# Patient Record
Sex: Male | Born: 1980
Health system: Southern US, Community
[De-identification: ages and names within clinical notes are randomized; demographics above are authoritative.]

## PROBLEM LIST (undated history)

## (undated) DIAGNOSIS — E46 Unspecified protein-calorie malnutrition: Secondary | ICD-10-CM

## (undated) DIAGNOSIS — G4733 Obstructive sleep apnea (adult) (pediatric): Secondary | ICD-10-CM

## (undated) DIAGNOSIS — G809 Cerebral palsy, unspecified: Secondary | ICD-10-CM

## (undated) DIAGNOSIS — K5909 Other constipation: Secondary | ICD-10-CM

## (undated) DIAGNOSIS — T17908A Unspecified foreign body in respiratory tract, part unspecified causing other injury, initial encounter: Secondary | ICD-10-CM

## (undated) DIAGNOSIS — Z9889 Other specified postprocedural states: Secondary | ICD-10-CM

## (undated) DIAGNOSIS — D509 Iron deficiency anemia, unspecified: Secondary | ICD-10-CM

## (undated) DIAGNOSIS — R0902 Hypoxemia: Secondary | ICD-10-CM

## (undated) DIAGNOSIS — R569 Unspecified convulsions: Secondary | ICD-10-CM

## (undated) DIAGNOSIS — G4731 Primary central sleep apnea: Secondary | ICD-10-CM

## (undated) DIAGNOSIS — Z87442 Personal history of urinary calculi: Secondary | ICD-10-CM

## (undated) DIAGNOSIS — K219 Gastro-esophageal reflux disease without esophagitis: Secondary | ICD-10-CM

## (undated) DIAGNOSIS — G919 Hydrocephalus, unspecified: Secondary | ICD-10-CM

## (undated) DIAGNOSIS — R000 Tachycardia, unspecified: Secondary | ICD-10-CM

## (undated) DIAGNOSIS — I499 Cardiac arrhythmia, unspecified: Secondary | ICD-10-CM

## (undated) HISTORY — PX: OTHER SURGICAL HISTORY: SHX169

## (undated) HISTORY — DX: Iron deficiency anemia, unspecified: D50.9

## (undated) HISTORY — DX: Primary central sleep apnea: G47.31

## (undated) HISTORY — PX: WISDOM TOOTH EXTRACTION: SHX21

## (undated) HISTORY — PX: RHIZOTOMY: SHX2355

## (undated) HISTORY — PX: VENTRICULAR ATRIAL SHUNT: SHX2657

## (undated) HISTORY — DX: Other constipation: K59.09

## (undated) HISTORY — PX: BACK SURGERY: SHX140

## (undated) HISTORY — DX: Cerebral palsy, unspecified: G80.9

## (undated) HISTORY — DX: Unspecified protein-calorie malnutrition: E46

## (undated) HISTORY — DX: Hydrocephalus, unspecified: G91.9

## (undated) HISTORY — PX: CYSTOSCOPY: SUR368

## (undated) HISTORY — PX: TOOTH EXTRACTION: SHX859

## (undated) HISTORY — PX: CSF SHUNT: SHX92

## (undated) HISTORY — DX: Obstructive sleep apnea (adult) (pediatric): G47.33

## (undated) HISTORY — PX: BRONCHOSCOPY: SUR163

## (undated) HISTORY — PX: VENTRICULOPERITONEAL SHUNT: SHX204

## (undated) HISTORY — DX: Unspecified foreign body in respiratory tract, part unspecified causing other injury, initial encounter: T17.908A

## (undated) HISTORY — DX: Hypoxemia: R09.02

## (undated) HISTORY — PX: EYE SURGERY: SHX253

## (undated) HISTORY — PX: PATENT DUCTUS ARTERIOUS REPAIR: SHX269

## (undated) HISTORY — PX: INGUINAL HERNIA REPAIR: SUR1180

## (undated) HISTORY — DX: Tachycardia, unspecified: R00.0

## (undated) HISTORY — PX: NEPHRECTOMY: SHX65

## (undated) HISTORY — PX: HIP SURGERY: SHX245

## (undated) HISTORY — DX: Gastro-esophageal reflux disease without esophagitis: K21.9

---

## 2009-11-06 ENCOUNTER — Ambulatory Visit: Payer: Self-pay | Admitting: Cardiology

## 2011-04-28 ENCOUNTER — Encounter: Payer: Self-pay | Admitting: *Deleted

## 2011-04-29 ENCOUNTER — Encounter: Payer: Self-pay | Admitting: *Deleted

## 2011-05-03 ENCOUNTER — Ambulatory Visit (INDEPENDENT_AMBULATORY_CARE_PROVIDER_SITE_OTHER): Payer: BC Managed Care – PPO | Admitting: Cardiology

## 2011-05-03 ENCOUNTER — Encounter: Payer: Self-pay | Admitting: Cardiology

## 2011-05-03 VITALS — BP 108/72 | HR 116 | Ht 73.0 in | Wt 90.0 lb

## 2011-05-03 DIAGNOSIS — G4733 Obstructive sleep apnea (adult) (pediatric): Secondary | ICD-10-CM

## 2011-05-03 DIAGNOSIS — R55 Syncope and collapse: Secondary | ICD-10-CM

## 2011-05-03 DIAGNOSIS — Z136 Encounter for screening for cardiovascular disorders: Secondary | ICD-10-CM

## 2011-05-03 DIAGNOSIS — R4182 Altered mental status, unspecified: Secondary | ICD-10-CM

## 2011-05-03 DIAGNOSIS — R000 Tachycardia, unspecified: Secondary | ICD-10-CM

## 2011-05-03 NOTE — Assessment & Plan Note (Signed)
He is due to have evaluation of this in the next several days.

## 2011-05-03 NOTE — Assessment & Plan Note (Signed)
This seems to be his baseline. There was some mention of orthostasis but there is no history consistent with this. Would be very difficult to obtain these readings. Regardless treatment would require meclizine or mineralocorticoid. He's been very sensitive to medications and his mother would like to trial avoid this without a clear diagnosis.

## 2011-05-03 NOTE — Patient Instructions (Signed)
Follow up in 2 weeks. Your physician recommends that you continue on your current medications as directed. Please refer to the Current Medication list given to you today. Your physician has recommended that you wear a holter monitor. Holter monitors are medical devices that record the heart's electrical activity. Doctors most often use these monitors to diagnose arrhythmias. Arrhythmias are problems with the speed or rhythm of the heartbeat. The monitor is a small, portable device. You can wear one while you do your normal daily activities. This is usually used to diagnose what is causing palpitations/syncope (passing out).

## 2011-05-03 NOTE — Assessment & Plan Note (Signed)
I do not see any overt cardiac etiology these episodes. I did review the results of an echocardiogram done one year ago and there are no abnormalities. There's been no overt arrhythmia and his mother reports recent normal laboratories to include a TSH. I will order a 48-hour Holter to begin the evaluation of these episodes. I'll have a low threshold for repeat echocardiography. Further testing will be based on these results.

## 2011-05-03 NOTE — Progress Notes (Signed)
HPI The patient has a history of cerebral palsy. He has been evaluated at [facility] for episodes of diffuse shaking and altered mental status. He has been studied with EEGs but is not felt to be having seizures. He did have a past history of this prior to placement of a VP shunt for treatment of increased intracranial pressures. However more recent neurologic evaluation of was negative.  I reviewed all of these records.  He brings a note that he typed to describe his "spells" saying that "I get cold. My head and my chest hurts sometimes. I blackout percent 5 seconds and then I'm okay."  His mother reports that these happened so quickly she has not had time to check his blood pressure. He seated upright reclined in his chair almost all the time. There is some mention of dyspnea though he currently denies this. He has aspiration and has had pneumonia but there's been no apparent PND or orthopnea. With the lack of communication can't quantify or qualify the chest pain. He is immobile and so these events happen at rest. There is no description of palpitations. There does not seem to be any precipitating event. They resolve spontaneously. They are happening now 1-2 times per day.  Allergies  Allergen Reactions  . Whole Blood   . Cefazolin Rash  . Septra (Bactrim) Hives  . Vancomycin Rash  . Versed Other (See Comments)    Un-reactive pupils after surgery    Current Outpatient Prescriptions  Medication Sig Dispense Refill  . BACLOFEN IT 1,449 mcg by Intrathecal route daily.      . dantrolene (DANTRIUM) 25 MG capsule Take 25 mg by mouth daily.      Marland Kitchen omeprazole (PRILOSEC) 20 MG capsule Take 20 mg by mouth 2 (two) times daily.      . polyethylene glycol (MIRALAX / GLYCOLAX) packet Take 8.5 g by mouth daily as needed.      . ranitidine (ZANTAC) 150 MG capsule Take 150 mg by mouth every evening.      . sodium chloride HYPERTONIC 3 % nebulizer solution Take by nebulization 2 (two) times daily.      .  sucralfate (CARAFATE) 1 GM/10ML suspension Take 500 mg by mouth 2 (two) times daily.      . Tegaserod Maleate (ZELNORM PO) Take 6 mg by mouth 2 (two) times daily.        Past Medical History  Diagnosis Date  . Cerebral palsy     with spastic quadriparesis with numerous orthopedic procedures for contractures and fratures  . Tachycardia     chronic  . Hypoxemia     chronic  . Chronic pulmonary aspiration   . Protein calorie malnutrition     chronic  . Hydrocephalus     s/p VP shunt  . Chronic constipation   . Iron deficiency anemia   . CSA (central sleep apnea)   . OSA (obstructive sleep apnea)   . GERD (gastroesophageal reflux disease)     Past Surgical History  Procedure Date  . Csf shunt   . Ventricular atrial shunt   . Nephrectomy     [facility]  . Eye surgery     MULTIPLE  . Hip surgery     MULTIPLE  . Back surgery     H[place] RODS PLACED FOR SCOLIOSIS  . Inguinal hernia repair     BILATERAL  . Baclofen pump placement     Family History  Problem Relation Age of Onset  . Colon  cancer Father   . Hypertension Father   . Osteoporosis Mother   . Osteoarthritis Mother     in her back    History   Social History  . Marital Status: Married    Spouse Name: N/A    Number of Children: N/A  . Years of Education: N/A   Occupational History  . Not on file.   Social History Main Topics  . Smoking status: Never Smoker   . Smokeless tobacco: Never Used  . Alcohol Use: No  . Drug Use: No  . Sexually Active: Not on file   Other Topics Concern  . Not on file   Social History Narrative   [place] graduatePatient is nonverbal and uses [employer] speech communication device for communication    ROS:  Positive for congenital single kidney, stage I skin ulcer, aspiration heartburn and cough, mild leg swelling, anemia. Otherwise, as stated in the HPI and negative for all other systems.  PHYSICAL EXAM BP 108/72   Pulse 116  Ht 6\' 1"  (1.854 m)  Wt 90 lb (40.824 kg)  BMI 11.87 kg/m2 PHYSICAL EXAM GEN:  No distress NECK:  No jugular venous distention at 90 degrees, waveform within normal limits, carotid upstroke brisk and symmetric, no bruits, no thyromegaly LYMPHATICS:  No cervical adenopathy LUNGS:  Clear to auscultation bilaterally BACK:  No CVA tenderness CHEST:  Diminished breath sounds. HEART:  S1 and S2 within normal limits, no S3, no S4, no clicks, no rubs, no murmurs ABD:  Positive bowel sounds normal in frequency in pitch, no bruits, no rebound, no guarding, unable to assess midline mass or bruit with the patient seated. EXT:  2 plus pulses throughout, no edema, no cyanosis no clubbing, severe muscle wasting with contractions SKIN:  No rashes no nodules NEURO:  Severe contracted quadriparesis. PSYCH:  Cognitively intact, oriented to person place and time, he has nonverbal communication using a computer pad or simple gestures.   EKG:  Sinus tachycardia, rate 110, axis within normal limits, intervals within normal limits, possible left atrial enlargement. 05/03/2011    ASSESSMENT AND PLAN

## 2011-05-06 ENCOUNTER — Encounter (INDEPENDENT_AMBULATORY_CARE_PROVIDER_SITE_OTHER): Payer: BC Managed Care – PPO | Admitting: *Deleted

## 2011-05-06 DIAGNOSIS — R55 Syncope and collapse: Secondary | ICD-10-CM

## 2011-05-06 NOTE — Progress Notes (Unsigned)
48 hour holter monitor applied with instructions given to mom.  She will return monitor on Monday.

## 2011-05-19 ENCOUNTER — Ambulatory Visit (INDEPENDENT_AMBULATORY_CARE_PROVIDER_SITE_OTHER): Payer: BC Managed Care – PPO | Admitting: Physician Assistant

## 2011-05-19 ENCOUNTER — Encounter: Payer: Self-pay | Admitting: Physician Assistant

## 2011-05-19 VITALS — BP 114/78 | HR 131 | Ht 61.0 in | Wt 90.0 lb

## 2011-05-19 DIAGNOSIS — I471 Supraventricular tachycardia: Secondary | ICD-10-CM | POA: Insufficient documentation

## 2011-05-19 MED ORDER — METOPROLOL SUCCINATE ER 25 MG PO TB24: 25.0000 mg | ORAL_TABLET | Freq: Every day | ORAL | Status: DC

## 2011-05-19 MED ORDER — METOPROLOL TARTRATE 25 MG PO TABS: 12.5000 mg | ORAL_TABLET | ORAL | Status: DC | PRN

## 2011-05-19 NOTE — Patient Instructions (Signed)
Follow up in 1 month. Start Toprol XL (metoprolol succ) 25 mg daily. Use Lopressor (metoprolol tart) 25 mg 1/2 tablet as needed for "spells."

## 2011-05-19 NOTE — Assessment & Plan Note (Signed)
Results of recent 48-hour Holter monitor were reviewed with Dr. Andee Lineman. Initial rhythm strip documenting a narrow complex tachyarrhythmia at approximately 160 bpm may be an AVNRT. Clearly, however, it is different from his baseline ST. Therefore, plan is to initiate treatment with beta blocker with Toprol-XL 25 mg daily. In addition, patient can use 12.5 mg Lopressor prn, during these "spells". We'll plan early followup with Dr. Antoine Poche in one month. This plan was discussed and reviewed with the patient's mother, by Dr.DeGent.

## 2011-05-19 NOTE — Progress Notes (Addendum)
HPI: Patient presents for early scheduled followup, following recent OV with Dr. Antoine Poche. She was referred for a 48-hour Holter monitor, subsequently reviewed by me, which revealed a run of PSVT at approximately 160-165 bpm. However, ST was the predominant rhythm.  The patient's mother states that his "spells" have increased in frequency, currently occurring at least once a day. They last less than 10 seconds in duration. His face is white, and he gazes off to the side. It is not clear if he is sensing a faster than usual heart rate, during these brief spells. His mother states that he has had a rapid heart rate, as long as she can remember. This is the first time that he has worn a heart monitor.  Allergies  Allergen Reactions  . Whole Blood   . Cefazolin Rash  . Midazolam Hcl Other (See Comments)    Un-reactive pupils after surgery  . Septra (Bactrim) Hives  . Vancomycin Rash    Current Outpatient Prescriptions  Medication Sig Dispense Refill  . BACLOFEN IT 1,449 mcg by Intrathecal route daily.      . dantrolene (DANTRIUM) 25 MG capsule Take 25 mg by mouth daily.      Marland Kitchen omeprazole (PRILOSEC) 20 MG capsule Take 20 mg by mouth 2 (two) times daily.      . polyethylene glycol (MIRALAX / GLYCOLAX) packet Take 8.5 g by mouth daily as needed.      . ranitidine (ZANTAC) 150 MG capsule Take 150 mg by mouth every evening.      . sodium chloride HYPERTONIC 3 % nebulizer solution Take by nebulization 2 (two) times daily.      . sucralfate (CARAFATE) 1 GM/10ML suspension Take 500 mg by mouth 2 (two) times daily.      . Tegaserod Maleate (ZELNORM PO) Take 6 mg by mouth 2 (two) times daily.        Past Medical History  Diagnosis Date  . Cerebral palsy     with spastic quadriparesis with numerous orthopedic procedures for contractures and fratures  . Tachycardia     chronic  . Hypoxemia     chronic  . Chronic pulmonary aspiration   . Protein calorie malnutrition     chronic  . Hydrocephalus       s/p VP shunt  . Chronic constipation   . Iron deficiency anemia   . CSA (central sleep apnea)   . OSA (obstructive sleep apnea)   . GERD (gastroesophageal reflux disease)     Past Surgical History  Procedure Date  . Csf shunt   . Ventricular atrial shunt   . Nephrectomy     [facility]  . Eye surgery     MULTIPLE  . Hip surgery     MULTIPLE  . Back surgery     [facility] RODS PLACED FOR SCOLIOSIS  . Inguinal hernia repair     BILATERAL  . Baclofen pump placement     History   Social History  . Marital Status: Married    Spouse Name: N/A    Number of Children: N/A  . Years of Education: N/A   Occupational History  . Not on file.   Social History Main Topics  . Smoking status: Never Smoker   . Smokeless tobacco: Never Used  . Alcohol Use: No  . Drug Use: No  . Sexually Active: Not on file   Other Topics Concern  . Not on file   Social History Narrative   [place] with mother and  [facility] graduatePatient is nonverbal and uses Dynavox speech communication device for communication    Family History  Problem Relation Age of Onset  . Colon cancer Father   . Hypertension Father   . Osteoporosis Mother   . Osteoarthritis Mother     in her back    ROS: no nausea, vomiting; no fever, chills; no melena, hematochezia; no claudication  PHYSICAL EXAM: BP 114/78  Pulse 131  Ht 5\' 1"  (1.549 m)  Wt 90 lb (40.824 kg)  BMI 17.01 kg/m2 GENERAL: 31 year old male, contorted, sitting in wheelchair; NAD HEENT: NCAT, PERRLA, EOMI; sclera clear; no xanthelasma NECK: no JVD; no TM LUNGS: CTA bilaterally CARDIAC: Regular rhythm, increased rate (S1, S2); no significant murmurs; no rubs or gallops ABDOMEN: soft, non-tender; intact BS EXTREMETIES: intact distal pulses; no significant peripheral edema SKIN: warm/dry; no obvious rash/lesions MUSCULOSKELETAL: Lateral deviation of both hands NEURO: Alert and oriented   EKG: reviewed and  available in Electronic Records  Patient seen and examined with Gene Winter Trefz, PA-C.  Counseling was provided regarding the current medical condition and included: . Diagnosis, impressions, prognosis, recommended diagnostic studies  . Risks and benefits of treatment options  . Instructions for management, treatment and/or follow-up care  . Importance of compliance with treatment, risk factor reduction  . Patient and/or family education    Time spent counseling was 15 minutes and recorded in the Problem List documeted by Gene Jonika Critz , PA-C      ASSESSMENT & PLAN:

## 2011-05-27 ENCOUNTER — Telehealth: Payer: Self-pay | Admitting: *Deleted

## 2011-05-27 NOTE — Telephone Encounter (Signed)
Pt's mother called stating she has not started the beta-blocker because it would have been started on a Friday and she wanted to have access to someone (provider) when she starts it because pt tends to have reactions to medicatons. She states she has been monitoring pt's BP's (not on beta-blocker). HR has ranged 83-129. BP has ranged 80-130's/50-60's. Reviewed with pt's mother that metoprolol can be help for SBP <90's. Also reviewed holter monitor results per pt's request. Pt verbalized understanding. She will start this on Monday.

## 2011-06-21 ENCOUNTER — Encounter: Payer: Self-pay | Admitting: Cardiology

## 2011-06-21 ENCOUNTER — Ambulatory Visit (INDEPENDENT_AMBULATORY_CARE_PROVIDER_SITE_OTHER): Payer: BC Managed Care – PPO | Admitting: Cardiology

## 2011-06-21 VITALS — BP 125/86 | HR 101 | Resp 18 | Ht 61.0 in | Wt 90.0 lb

## 2011-06-21 DIAGNOSIS — I471 Supraventricular tachycardia: Secondary | ICD-10-CM

## 2011-06-21 NOTE — Progress Notes (Signed)
HPI He presents for follow up of "spells".  These are described previously.  He did wear a monitor and was found to have an SVT.  He was started on a low dose of beta blocker.  However, his mother did not start this because at the same time he was having significant problems with his sleep apnea and is CPAP. In fact there is now talk about home ventilation. He's still having the spells described and probably more frequently. He's not describing any new complaints. His mother does note that she's been taking his blood pressure more frequently and finds that he's having episodes of hypotension and she is concerned about the use of beta blockers.    Allergies  Allergen Reactions  . Whole Blood   . Cefazolin Rash  . Midazolam Hcl Other (See Comments)    Un-reactive pupils after surgery  . Septra (Bactrim) Hives  . Vancomycin Rash    Current Outpatient Prescriptions  Medication Sig Dispense Refill  . BACLOFEN IT 1,449 mcg by Intrathecal route daily.      . dantrolene (DANTRIUM) 25 MG capsule Take 25 mg by mouth daily.      Marland Kitchen omeprazole (PRILOSEC) 20 MG capsule Take 20 mg by mouth 2 (two) times daily.      . polyethylene glycol (MIRALAX / GLYCOLAX) packet Take 8.5 g by mouth daily as needed.      . ranitidine (ZANTAC) 150 MG capsule Take 150 mg by mouth every evening.      . sodium chloride HYPERTONIC 3 % nebulizer solution Take by nebulization 2 (two) times daily.      . sucralfate (CARAFATE) 1 GM/10ML suspension Take 500 mg by mouth 2 (two) times daily.      . Tegaserod Maleate (ZELNORM PO) Take 6 mg by mouth 2 (two) times daily.        Past Medical History  Diagnosis Date  . Cerebral palsy     with spastic quadriparesis with numerous orthopedic procedures for contractures and fratures  . Tachycardia     chronic  . Hypoxemia     chronic  . Chronic pulmonary aspiration   . Protein calorie malnutrition     chronic  . Hydrocephalus     s/p VP shunt  . Chronic constipation   .  Iron deficiency anemia   . CSA (central sleep apnea)   . OSA (obstructive sleep apnea)   . GERD (gastroesophageal reflux disease)     Past Surgical History  Procedure Date  . Csf shunt   . Ventricular atrial shunt   . Nephrectomy     [facility]  . Eye surgery     MULTIPLE  . Hip surgery     MULTIPLE  . Back surgery     H[place] RODS PLACED FOR SCOLIOSIS  . Inguinal hernia repair     BILATERAL  . Baclofen pump placement      ROS:  Positive for congenital single kidney, stage I skin ulcer, aspiration heartburn and cough, mild leg swelling, anemia. Otherwise, as stated in the HPI and negative for all other systems.  PHYSICAL EXAM BP 125/86  Pulse 101  Resp 18  Ht 5\' 1"  (1.549 m)  Wt 90 lb (40.824 kg)  BMI 17.01 kg/m2 PHYSICAL EXAM GEN:  No distress NECK:  No jugular venous distention at 90 degrees, waveform within normal limits, carotid upstroke brisk and symmetric, no bruits, no thyromegaly LYMPHATICS:  No cervical adenopathy LUNGS:  Clear to auscultation bilaterally BACK:  No  CVA tenderness CHEST:  Diminished breath sounds. HEART:  S1 and S2 within normal limits, no S3, no S4, no clicks, no rubs, no murmurs ABD:  Positive bowel sounds normal in frequency in pitch, no bruits, no rebound, no guarding, unable to assess midline mass or bruit with the patient seated. EXT:  2 plus pulses throughout, no edema, no cyanosis no clubbing, severe muscle wasting with contractions SKIN:  No rashes no nodules NEURO:  Severe contracted quadriparesis. PSYCH:  Cognitively intact, oriented to person place and time, he has nonverbal communication using a computer pad or simple gestures.   EKG:  Sinus tachycardia, rate 110, axis within normal limits, intervals within normal limits, possible left atrial enlargement. 06/21/2011    ASSESSMENT AND PLAN

## 2011-06-21 NOTE — Assessment & Plan Note (Signed)
We decided to hold off on the beta blocker for a few weeks until he has more time with a new CPAP type mask. After he's been more comfortable with this and this acute issue is improved he will start on immediate release metoprolol. He will take it twice daily but hold it if his systolic blood pressure is noted to be less than 85. After he's been on this for about a month I'll see him back to further evaluate any improvement in the spells that have been described.

## 2011-06-21 NOTE — Patient Instructions (Addendum)
Your physician recommends that you schedule a follow-up appointment around the middle of August with Dr. Antoine Poche.  Your physician has recommended you make the following change in your medication: start metoprolol tartrate 12.5 mg twice daily if the Systolic Blood Pressure is greater than 85.

## 2011-07-19 ENCOUNTER — Telehealth: Payer: Self-pay | Admitting: *Deleted

## 2011-07-19 NOTE — Telephone Encounter (Signed)
Mother called to inform Dr. Antoine Poche that there will be changes with medications and whether or not she should hold off on starting the metoprolol to see if the changes with Baclofen may cause changes with condition of heart rate. Mother said that patient usually has the spells after BPAP finishes after 6 hour use. Patient's settings for baclofen will be changed from bolus to continuous starting 08/08/11/ Please advise if metoprolol should be started or held off until after baclofen changes take place. Metoprolol was supposed to start on 07/01.

## 2011-07-20 NOTE — Telephone Encounter (Signed)
OK to hold off on the beta blocker while other medications are being changed.

## 2011-07-20 NOTE — Telephone Encounter (Signed)
Mother informed.

## 2011-08-22 ENCOUNTER — Ambulatory Visit: Payer: BC Managed Care – PPO | Admitting: Cardiology

## 2011-09-26 ENCOUNTER — Ambulatory Visit (INDEPENDENT_AMBULATORY_CARE_PROVIDER_SITE_OTHER): Payer: BC Managed Care – PPO | Admitting: Cardiology

## 2011-09-26 ENCOUNTER — Encounter: Payer: Self-pay | Admitting: Cardiology

## 2011-09-26 ENCOUNTER — Ambulatory Visit: Payer: BC Managed Care – PPO | Admitting: Cardiology

## 2011-09-26 VITALS — BP 122/82 | HR 100 | Ht 65.0 in | Wt 104.0 lb

## 2011-09-26 DIAGNOSIS — I498 Other specified cardiac arrhythmias: Secondary | ICD-10-CM

## 2011-09-26 DIAGNOSIS — I471 Supraventricular tachycardia: Secondary | ICD-10-CM

## 2011-09-26 NOTE — Patient Instructions (Addendum)
Your physician recommends that you schedule a follow-up appointment in: 4 weeks.  Your physician recommends that you continue on your current medications as directed. Please refer to the Current Medication list given to you today.  Your physician has requested that you have an echocardiogram. Echocardiography is a painless test that uses sound waves to create images of your heart. It provides your doctor with information about the size and shape of your heart and how well your heart's chambers and valves are working. This procedure takes approximately one hour. There are no restrictions for this procedure.

## 2011-09-26 NOTE — Progress Notes (Signed)
HPI He presents for follow up of "spells".  These are described previously.  He did wear a monitor and was found to have an SVT.  We have been talking at previous appointments about starting a beta blocker. However, for various reasons he did not do this until about one week ago. He is tolerating immediate release beta blocker only in the mornings. His blood pressure is too low in the evenings. He unfortunately continues to have episodes of unresponsiveness. In fact he had 4 today which is more than usual. Again he's had an extensive neurologic workup and these are not felt to be seizures. Interestingly however, his mother thinks these are triggered by things that excite him such as discussing something that might get him excited or agitated. One time he had an episode while she had a blood pressure cuff on and she noticed that his blood pressure actually went up immediately following the event. They only last for about 30 seconds and there is no actual loss of consciousness though she thinks if he was not strapped into his wheelchair he might go to the ground. He gets clammy. It is not clear that these are related to his previously documented SVT. He does not describe chest pressure, neck or arm discomfort. He's not having any new shortness of breath, PND or orthopnea.  Allergies  Allergen Reactions  . Whole Blood   . Cefazolin Rash  . Midazolam Hcl Other (See Comments)    Un-reactive pupils after surgery  . Septra (Bactrim) Hives  . Vancomycin Rash    Current Outpatient Prescriptions  Medication Sig Dispense Refill  . BACLOFEN IT 1,449 mcg by Intrathecal route daily.      . dantrolene (DANTRIUM) 25 MG capsule Take 25 mg by mouth daily.      Marland Kitchen LINZESS 145 MCG CAPS Take 145 mcg by mouth daily.       . metoprolol tartrate (LOPRESSOR) 25 MG tablet Take 25 mg by mouth daily. Use only if systolic blood pressure is greater than 85      . Nutritional Supplements (NUTRITIONAL SUPPLEMENT PO) Take by  mouth daily. Scand[place]ake      . nystatin (MYCOSTATIN) 100000 UNIT/ML suspension Take 100,000 Units by mouth 2 (two) times daily.       Marland Kitchen omeprazole (PRILOSEC) 20 MG capsule Take 20 mg by mouth 2 (two) times daily.      . polyethylene glycol (MIRALAX / GLYCOLAX) packet Take 8.5 g by mouth daily as needed.      . ranitidine (ZANTAC) 150 MG capsule Take 150 mg by mouth every evening.      . sodium chloride HYPERTONIC 3 % nebulizer solution Take by nebulization 2 (two) times daily.      . sucralfate (CARAFATE) 1 GM/10ML suspension Take 500 mg by mouth 2 (two) times daily.      . Tegaserod Maleate (ZELNORM PO) Take 6 mg by mouth 2 (two) times daily.        Past Medical History  Diagnosis Date  . Cerebral palsy     with spastic quadriparesis with numerous orthopedic procedures for contractures and fratures  . Tachycardia     chronic  . Hypoxemia     chronic  . Chronic pulmonary aspiration   . Protein calorie malnutrition     chronic  . Hydrocephalus     s/p VP shunt  . Chronic constipation   . Iron deficiency anemia   . CSA (central sleep apnea)   . OSA (  obstructive sleep apnea)   . GERD (gastroesophageal reflux disease)     Past Surgical History  Procedure Date  . Csf shunt   . Ventricular atrial shunt   . Nephrectomy     [facility]  . Eye surgery     MULTIPLE  . Hip surgery     MULTIPLE  . Back surgery     [facility] RODS PLACED FOR SCOLIOSIS  . Inguinal hernia repair     BILATERAL  . Baclofen pump placement      ROS:  Positive for congenital single kidney, stage I skin ulcer, aspiration heartburn and cough, mild leg swelling, anemia. Otherwise, as stated in the HPI and negative for all other systems.  PHYSICAL EXAM BP 122/82  Pulse 100  Ht 5\' 5"  (1.651 m)  Wt 104 lb (47.174 kg)  BMI 17.31 kg/m2 PHYSICAL EXAM GEN:  No distress NECK:  No jugular venous distention at 90 degrees, waveform within normal limits, carotid upstroke brisk and symmetric, no bruits, no  thyromegaly LYMPHATICS:  No cervical adenopathy LUNGS:  Clear to auscultation bilaterally BACK:  No CVA tenderness CHEST:  Diminished breath sounds. HEART:  S1 and S2 within normal limits, no S3, no S4, no clicks, no rubs, no murmurs ABD:  Positive bowel sounds normal in frequency in pitch, no bruits, no rebound, no guarding, unable to assess midline mass or bruit with the patient seated. EXT:  2 plus pulses throughout, no edema, no cyanosis no clubbing, severe muscle wasting with contractions SKIN:  No rashes no nodules NEURO:  Severe contracted quadriparesis. PSYCH:  Cognitively intact, oriented to person place and time, he has nonverbal communication using a computer pad or simple gestures.     ASSESSMENT AND PLAN  PSVT (paroxysmal supraventricular tachycardia) -  He will continue with the beta blocker for another 3 weeks or so to see if this helps with any of his symptoms. I am still not at all convinced that his "spells" are related to his SVT. However, were trying to treat this symptomatically and the next step might be to try calcium channel blocker. I am going to repeat an echocardiogram to make sure he has a structurally normal heart. I will continue to work with the patient and his mother to try to sort out the symptoms.

## 2011-09-29 ENCOUNTER — Ambulatory Visit: Payer: BC Managed Care – PPO | Admitting: Cardiology

## 2011-09-30 ENCOUNTER — Telehealth: Payer: Self-pay | Admitting: Cardiology

## 2011-09-30 NOTE — Telephone Encounter (Signed)
Received call from patient mother asking about stopping patient meteprolol.  Per Ms. Grob, patient "spells" have increased since taking this medication.  Please call patient mother.

## 2011-10-04 NOTE — Telephone Encounter (Signed)
He can remain off of the metoprolol.  Since it is unlikely that these episodes are cardiac I would suggest that she discuss his increased spells with her primary care MD or neurology before we consider another cardiac treatment.

## 2011-10-05 NOTE — Telephone Encounter (Signed)
Left message to return call 

## 2011-10-06 ENCOUNTER — Other Ambulatory Visit: Payer: Self-pay

## 2011-10-06 ENCOUNTER — Other Ambulatory Visit (INDEPENDENT_AMBULATORY_CARE_PROVIDER_SITE_OTHER): Payer: BC Managed Care – PPO

## 2011-10-06 DIAGNOSIS — I471 Supraventricular tachycardia: Secondary | ICD-10-CM

## 2011-10-06 NOTE — Telephone Encounter (Signed)
Mother informed and verbalized understanding. She also said patient saw neurologist at [facility] yesterday and the spell was witnessed by Neurologist.

## 2011-10-11 ENCOUNTER — Telehealth: Payer: Self-pay | Admitting: *Deleted

## 2011-10-11 NOTE — Telephone Encounter (Signed)
Message copied by Eustace Moore on Tue Oct 11, 2011  1:44 PM ------      Message from: Rocco Serene J      Created: Tue Oct 11, 2011 11:57 AM                   ----- Message -----         From: Rollene Rotunda, MD         Sent: 10/09/2011   4:00 PM           To: Rocco Serene, RN            Essentially normal result.  No change in therapy.  Call Mr. Wurzer with the results and send results to Selinda Flavin, MD

## 2011-10-11 NOTE — Telephone Encounter (Signed)
Patient's mother informed

## 2011-11-02 ENCOUNTER — Encounter: Payer: Self-pay | Admitting: Cardiology

## 2011-11-02 ENCOUNTER — Ambulatory Visit (INDEPENDENT_AMBULATORY_CARE_PROVIDER_SITE_OTHER): Payer: BC Managed Care – PPO | Admitting: Cardiology

## 2011-11-02 VITALS — BP 118/82 | HR 105 | Ht 64.0 in | Wt 104.0 lb

## 2011-11-02 DIAGNOSIS — I471 Supraventricular tachycardia: Secondary | ICD-10-CM

## 2011-11-02 NOTE — Progress Notes (Signed)
HPI He presents for follow up of "spells".  These are described previously.  He did wear a monitor and was found to have an SVT.  At the last appointment he did have an echocardiogram which showed a low-normal ejection fraction with no other significant abnormalities. Images were somewhat suboptimal. At the time of the echo the patient was having frequent spells. 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 His mother notes that these spells seemed to occur when he is excited about something. She has made this relation. Since the premier is over he has had many fewer episodes.  Because of this his mother is again wanting to investigate a possible neurologic etiology and the patient has a followup EEG scheduled at [facility].  Otherwise there've been no complaints of palpitations.  There has been no chest pain or shortness of breath.  Allergies  Allergen Reactions  . Whole Blood   . Cefazolin Rash  . Midazolam Hcl Other (See Comments)    Un-reactive pupils after surgery  . Septra (Bactrim) Hives  . Vancomycin Rash    Current Outpatient Prescriptions  Medication Sig Dispense Refill  . BACLOFEN IT 1,449 mcg by Intrathecal route daily.      . dantrolene (DANTRIUM) 25 MG capsule Take 25 mg by mouth daily.      Marland Kitchen doxycycline (VIBRAMYCIN) 100 MG capsule Take 1 capsule by mouth Twice daily.      Marland Kitchen LINZESS 145 MCG CAPS Take 145 mcg by mouth daily.       . Nutritional Supplements (NUTRITIONAL SUPPLEMENT PO) Take by mouth daily. Scandishake      . nystatin (MYCOSTATIN) 100000 UNIT/ML suspension Take 100,000 Units by mouth 2 (two) times daily.       Marland Kitchen omeprazole (PRILOSEC) 20 MG capsule Take 20 mg by mouth 2 (two) times daily.      . polyethylene glycol (MIRALAX / GLYCOLAX) packet Take 8.5 g by mouth daily as needed.      . ranitidine (ZANTAC) 150 MG capsule Take 150 mg by mouth every evening.      . sodium  chloride HYPERTONIC 3 % nebulizer solution Take by nebulization 2 (two) times daily.      . sucralfate (CARAFATE) 1 GM/10ML suspension Take 500 mg by mouth 2 (two) times daily.      . Tegaserod Maleate (ZELNORM PO) Take 6 mg by mouth 2 (two) times daily.        Past Medical History  Diagnosis Date  . Cerebral palsy     with spastic quadriparesis with numerous orthopedic procedures for contractures and fratures  . Tachycardia     chronic  . Hypoxemia     chronic  . Chronic pulmonary aspiration   . Protein calorie malnutrition     chronic  . Hydrocephalus     s/p VP shunt  . Chronic constipation   . Iron deficiency anemia   . CSA (central sleep apnea)   . OSA (obstructive sleep apnea)   . GERD (gastroesophageal reflux disease)     Past Surgical History  Procedure Date  . Csf shunt   . Ventricular atrial shunt   . Nephrectomy     [facility]  . Eye surgery     MULTIPLE  . Hip surgery     MULTIPLE  . Back surgery     HARRINGTON RODS PLACED FOR SCOLIOSIS  . Inguinal hernia  repair     BILATERAL  . Baclofen pump placement     ROS:  As stated in the HPI and negative for all other systems.  PHYSICAL EXAM BP 118/82  Pulse 105  Ht 5\' 4"  (1.626 m)  Wt 104 lb (47.174 kg)  BMI 17.85 kg/m2 PHYSICAL EXAM GEN:  No distress NECK:  No jugular venous distention at 90 degrees, waveform within normal limits, carotid upstroke brisk and symmetric, no bruits, no thyromegaly LUNGS:  Clear to auscultation bilaterally CHEST:  Diminished breath sounds. HEART:  S1 and S2 within normal limits, no S3, no S4, no clicks, no rubs, no murmurs ABD:  Positive bowel sounds normal in frequency in pitch, no bruits, no rebound, no guarding, unable to assess midline mass or bruit with the patient seated. EXT:  2 plus pulses throughout, no edema, no cyanosis no clubbing, severe muscle wasting with contractions    ASSESSMENT AND PLAN  PSVT (paroxysmal supraventricular tachycardia) -  At this point  I would not suspect that his spells are related to any arrhythmias.  Though he has a low-normal echocardiogram I don't think he's having any symptoms related to this. I might followup with an echocardiogram in one year. However, at this point I don't think that further cardiovascular testing is necessary. I discussed this with the patient and his mom.

## 2011-11-02 NOTE — Patient Instructions (Addendum)

## 2012-03-04 DIAGNOSIS — I498 Other specified cardiac arrhythmias: Secondary | ICD-10-CM

## 2012-03-05 ENCOUNTER — Encounter: Payer: Self-pay | Admitting: Physician Assistant

## 2012-03-05 DIAGNOSIS — I4729 Other ventricular tachycardia: Secondary | ICD-10-CM

## 2012-03-05 DIAGNOSIS — Z01810 Encounter for preprocedural cardiovascular examination: Secondary | ICD-10-CM

## 2012-03-05 DIAGNOSIS — I472 Ventricular tachycardia: Secondary | ICD-10-CM

## 2012-11-12 ENCOUNTER — Ambulatory Visit: Payer: BC Managed Care – PPO | Admitting: Cardiology

## 2012-12-24 ENCOUNTER — Encounter: Payer: Self-pay | Admitting: Cardiology

## 2012-12-24 ENCOUNTER — Ambulatory Visit (INDEPENDENT_AMBULATORY_CARE_PROVIDER_SITE_OTHER): Payer: BC Managed Care – PPO | Admitting: Cardiology

## 2012-12-24 VITALS — BP 117/81 | HR 102 | Temp 97.4°F | Ht 64.0 in | Wt 116.0 lb

## 2012-12-24 DIAGNOSIS — I471 Supraventricular tachycardia: Secondary | ICD-10-CM

## 2012-12-24 NOTE — Patient Instructions (Signed)
Your physician recommends that you schedule a follow-up appointment in: 1 year with Dr. Branch. You should receive a letter in the mail in 10 months. If you do not receive this letter by October 2015 call our office to schedule this appointment.   Your physician recommends that you continue on your current medications as directed. Please refer to the Current Medication list given to you today.  

## 2012-12-24 NOTE — Progress Notes (Signed)
Clinical Summary [name] is a 32 y.o.male last seen by Dr Antoine Poche, this is our first visit together. He was seen for the following medical problems.   1. Paroxysmal supraventricular tachycardia - discovered initially during workup for abnormal "spells" the patient was having per notes. Unclear if related to his symptoms - noted on prior cardiac monitoring - prior echo shows  normal LVEF with no other significant abnormalities - tried on metoprolol however per report seemed to have increased frequency of episodes.  - he has also been worked up extensively by [facility] neurology for these episodes, and recent was noted to be having seizure activity, adjustments to his medications have been made with plans for follow up.   Past Medical History  Diagnosis Date  . Cerebral palsy     with spastic quadriparesis with numerous orthopedic procedures for contractures and fratures  . Tachycardia     chronic  . Hypoxemia     chronic  . Chronic pulmonary aspiration   . Protein calorie malnutrition     chronic  . Hydrocephalus     s/p VP shunt  . Chronic constipation   . Iron deficiency anemia   . CSA (central sleep apnea)   . OSA (obstructive sleep apnea)   . GERD (gastroesophageal reflux disease)      Allergies  Allergen Reactions  . Whole Blood   . Cefazolin Rash  . Midazolam Hcl Other (See Comments)    Un-reactive pupils after surgery  . Septra [Bactrim] Hives  . Vancomycin Rash     Current Outpatient Prescriptions  Medication Sig Dispense Refill  . BACLOFEN IT 1,449 mcg by Intrathecal route daily.      . dantrolene (DANTRIUM) 25 MG capsule Take 25 mg by mouth daily.      Marland Kitchen LINZESS 145 MCG CAPS Take 145 mcg by mouth daily.       . Nutritional Supplements (NUTRITIONAL SUPPLEMENT PO) Take by mouth daily. Scandishake      . nystatin (MYCOSTATIN) 100000 UNIT/ML suspension Take 100,000 Units by mouth 2 (two) times daily.       Marland Kitchen omeprazole (PRILOSEC) 20 MG capsule Take 20 mg by  mouth 2 (two) times daily.      . polyethylene glycol (MIRALAX / GLYCOLAX) packet Take 8.5 g by mouth daily as needed.      . ranitidine (ZANTAC) 150 MG capsule Take 150 mg by mouth every evening.      . sodium chloride HYPERTONIC 3 % nebulizer solution Take by nebulization 2 (two) times daily.      . sucralfate (CARAFATE) 1 GM/10ML suspension Take 500 mg by mouth 2 (two) times daily.      . Tegaserod Maleate (ZELNORM PO) Take 6 mg by mouth 2 (two) times daily.       No current facility-administered medications for this visit.     Past Surgical History  Procedure Laterality Date  . Csf shunt    . Ventricular atrial shunt    . Nephrectomy      [facility]  . Eye surgery      MULTIPLE  . Hip surgery      MULTIPLE  . Back surgery      HARRINGTON RODS PLACED FOR SCOLIOSIS  . Inguinal hernia repair      BILATERAL  . Baclofen pump placement       Allergies  Allergen Reactions  . Whole Blood   . Cefazolin Rash  . Midazolam Hcl Other (See Comments)  Un-reactive pupils after surgery  . Septra [Bactrim] Hives  . Vancomycin Rash      Family History  Problem Relation Age of Onset  . Colon cancer Father   . Hypertension Father   . Osteoporosis Mother   . Osteoarthritis Mother     in her back     Social History Mr. Friar reports that he has never smoked. He has never used smokeless tobacco. Mr. Grau reports that he does not drink alcohol.   Review of Systems CONSTITUTIONAL: No weight loss, fever, chills, weakness or fatigue.  HEENT: Eyes: No visual loss, blurred vision, double vision or yellow sclerae.No hearing loss, sneezing, congestion, runny nose or sore throat.  SKIN: No rash or itching.  CARDIOVASCULAR: per HPI RESPIRATORY: No shortness of breath, cough or sputum.  GASTROINTESTINAL: reflux GENITOURINARY: No burning on urination, no polyuria NEUROLOGICAL: muscle spasms, + seizures MUSCULOSKELETAL: No muscle, back pain, joint pain or stiffness.    LYMPHATICS: No enlarged nodes. No history of splenectomy.  PSYCHIATRIC: No history of depression or anxiety.  ENDOCRINOLOGIC: No reports of sweating, cold or heat intolerance. No polyuria or polydipsia.  Marland Kitchen   Physical Examination p 102 bp 117/81 Wt 116 lbs BMI 20 Gen: resting comfortably, no acute distress HEENT: no scleral icterus, pupils equal round and reactive, no palptable cervical adenopathy,  CV: regular, rate 105, no m/r/g, no JVD Resp: Clear to auscultation bilaterally GI: abdomen is soft, non-tender, non-distended, normal bowel sounds, no hepatosplenomegaly MSK: extremities are warm, no edema.  Skin: warm, no rash Neuro:  Non-verbal Psych: appropriate affect   Diagnostic Studies 02/2012 Echo LVEF 60-65%, grade I diastolic dysfunction, normal LA size,    Assessment and Plan  1. PSVT - does not appear related to the symptoms that he has been having, recently found to be having seizures that are being treated at [facility] - tried on metoprolol previously however seemed to increase his symptoms - at this time no significant palpitations, no structural heart disease. Will continue to monitor, no further cardiac workup or intervention at this time. - if symptoms become more prominent, consider trial of calcium channel blockers.       Antoine Poche, M.D., F.A.C.C.

## 2013-03-20 ENCOUNTER — Encounter: Payer: Self-pay | Admitting: Cardiology

## 2013-12-31 ENCOUNTER — Ambulatory Visit: Payer: BC Managed Care – PPO | Admitting: Cardiology

## 2014-01-02 ENCOUNTER — Ambulatory Visit: Payer: BC Managed Care – PPO | Admitting: Cardiology

## 2014-01-27 ENCOUNTER — Encounter: Payer: Self-pay | Admitting: Cardiology

## 2014-01-27 ENCOUNTER — Ambulatory Visit (INDEPENDENT_AMBULATORY_CARE_PROVIDER_SITE_OTHER): Payer: BC Managed Care – PPO | Admitting: Cardiology

## 2014-01-27 VITALS — BP 160/104 | HR 100 | Ht 64.0 in | Wt 110.0 lb

## 2014-01-27 DIAGNOSIS — R000 Tachycardia, unspecified: Secondary | ICD-10-CM | POA: Diagnosis not present

## 2014-01-27 NOTE — Patient Instructions (Signed)
Your physician wants you to follow-up in: 1 year with Dr. Branch  You will receive a reminder letter in the mail two months in advance. If you don't receive a letter, please call our office to schedule the follow-up appointment.  Your physician recommends that you continue on your current medications as directed. Please refer to the Current Medication list given to you today.  Thank you for choosing [facility]!!   

## 2014-01-27 NOTE — Progress Notes (Signed)
Clinical Summary [name] is a 34 y.o.male  1. Paroxysmal supraventricular tachycardia - discovered initially during workup for abnormal "spells" the patient was having per notes with cardiac monitoring. - prior echo shows normal LVEF with no other significant abnormalities - tried on metoprolol however per report seemed to have increased frequency of episodes.  - he was later found to be having seizure activity during these spells by [facility], with improved episodes with alterations of his seizure therapy. The tachycardia may have been provoked by this seizure activity.   - denies any recent palpitations.    2. Cerebal palsy - followed at [facility] neuro   Past Medical History  Diagnosis Date  . Cerebral palsy     with spastic quadriparesis with numerous orthopedic procedures for contractures and fratures  . Tachycardia     chronic  . Hypoxemia     chronic  . Chronic pulmonary aspiration   . Protein calorie malnutrition     chronic  . Hydrocephalus     s/p VP shunt  . Chronic constipation   . Iron deficiency anemia   . CSA (central sleep apnea)   . OSA (obstructive sleep apnea)   . GERD (gastroesophageal reflux disease)      Allergies  Allergen Reactions  . Whole Blood   . Cefazolin Rash  . Midazolam Hcl Other (See Comments)    Un-reactive pupils after surgery  . Septra [Bactrim] Hives  . Vancomycin Rash     Current Outpatient Prescriptions  Medication Sig Dispense Refill  . amoxicillin (AMOXIL) 500 MG capsule Take 4 capsules by mouth as directed. Prior to dental procedures    . BACLOFEN IT 1,449 mcg by Intrathecal route daily.    Marland Kitchen ketoconazole (NIZORAL) 2 % shampoo Apply 1 application topically as needed.    . lamoTRIgine (LAMICTAL) 25 MG tablet Take 75 mg by mouth 2 (two) times daily.    Marland Kitchen LINZESS 145 MCG CAPS Take 145 mcg by mouth daily.     Marland Kitchen nystatin (MYCOSTATIN) 100000 UNIT/ML suspension Take 100,000 Units by mouth 2 (two) times daily.     Marland Kitchen  omeprazole (PRILOSEC) 20 MG capsule Take 20 mg by mouth 2 (two) times daily.    . polyethylene glycol (MIRALAX / GLYCOLAX) packet Take 8.5 g by mouth daily as needed.    . ranitidine (ZANTAC) 150 MG capsule Take 150 mg by mouth every evening.    . sodium chloride HYPERTONIC 3 % nebulizer solution Take by nebulization 2 (two) times daily.    . sucralfate (CARAFATE) 1 GM/10ML suspension Take 500 mg by mouth 2 (two) times daily.    . tamsulosin (FLOMAX) 0.4 MG CAPS capsule Take 1 mg by mouth daily.     No current facility-administered medications for this visit.     Past Surgical History  Procedure Laterality Date  . Csf shunt    . Ventricular atrial shunt    . Nephrectomy      [facility]  . Eye surgery      MULTIPLE  . Hip surgery      MULTIPLE  . Back surgery      HARRINGTON RODS PLACED FOR SCOLIOSIS  . Inguinal hernia repair      BILATERAL  . Baclofen pump placement       Allergies  Allergen Reactions  . Whole Blood   . Cefazolin Rash  . Midazolam Hcl Other (See Comments)    Un-reactive pupils after surgery  . Septra [Bactrim] Hives  .  Vancomycin Rash      Family History  Problem Relation Age of Onset  . Colon cancer Father   . Hypertension Father   . Osteoporosis Mother   . Osteoarthritis Mother     in her back     Social History [name] reports that he has never smoked. He has never used smokeless tobacco. [name] reports that he does not drink alcohol.   Review of Systems CONSTITUTIONAL: No weight loss, fever, chills, weakness or fatigue.  HEENT: Eyes: No visual loss, blurred vision, double vision or yellow sclerae.No hearing loss, sneezing, congestion, runny nose or sore throat.  SKIN: No rash or itching.  CARDIOVASCULAR: per HPI RESPIRATORY: No shortness of breath, cough or sputum.  GASTROINTESTINAL: No anorexia, nausea, vomiting or diarrhea. No abdominal pain or blood.  GENITOURINARY: No burning on urination, no polyuria NEUROLOGICAL: +  seizure activity, muscle spasms MUSCULOSKELETAL: No muscle, back pain, joint pain or stiffness.  LYMPHATICS: No enlarged nodes. No history of splenectomy.  PSYCHIATRIC: No history of depression or anxiety.  ENDOCRINOLOGIC: No reports of sweating, cold or heat intolerance. No polyuria or polydipsia.  Marland Kitchen.   Physical Examination p 100 bp 160/104 Wt 110 lbs BMI 19 Gen: resting comfortably, no acute distress HEENT: no scleral icterus, pupils equal round and reactive, no palptable cervical adenopathy,  CV: RRR, no m/r/g, no JVD, no carotid bruits Resp: Clear to auscultation bilaterally GI: abdomen is soft, non-tender, non-distended, normal bowel sounds, no hepatosplenomegaly MSK: extremities are warm, no edema.  Skin: warm, no rash Neuro:  no focal deficits Psych: appropriate affect   Diagnostic Studies 02/2012 Echo LVEF 60-65%, grade I diastolic dysfunction, normal LA size,     Assessment and Plan  1. PSVT - does not appear related to the symptoms that he has been having,  found to be having [facility] - tried on metoprolol previously however seemed to increase his symptoms - at this time no significant palpitations, no structural heart disease. Will continue to monitor, no further cardiac workup or intervention at this time. - if symptoms become more prominent, consider trial of calcium channel blockers.     F/u 1 year  Joshua Logan, M.D.

## 2015-01-21 ENCOUNTER — Ambulatory Visit (INDEPENDENT_AMBULATORY_CARE_PROVIDER_SITE_OTHER): Payer: BC Managed Care – PPO | Admitting: Cardiology

## 2015-01-21 ENCOUNTER — Encounter: Payer: Self-pay | Admitting: Cardiology

## 2015-01-21 VITALS — BP 126/88 | HR 105 | Ht 64.0 in | Wt 110.0 lb

## 2015-01-21 DIAGNOSIS — I471 Supraventricular tachycardia: Secondary | ICD-10-CM

## 2015-01-21 NOTE — Patient Instructions (Signed)
Your physician recommends that you schedule a follow-up appointment in: AS NEEDED WITH DR. BRANCH  Your physician recommends that you continue on your current medications as directed. Please refer to the Current Medication list given to you today.  Your physician recommends that you return for lab work LIPIDS/CBC/CMP/MG/TSH  Thank you for choosing [facility]!!

## 2015-01-21 NOTE — Progress Notes (Signed)
Patient ID: Joshua Logan, male   DOB: Jun 30, 1980, 35 y.o.   MRN: [id number]     Clinical Summary Joshua Logan is a 35 y.o.male seen today for follow up of the following medical problems.   1. Paroxysmal supraventricular tachycardia/Chronic tachycardia - discovered initially during workup for abnormal "spells" the patient was having per notes with cardiac monitoring. - prior echo shows normal LVEF with no other significant abnormalities - tried on metoprolol however per report seemed to have increased frequency of episodes.  - he was later found to be having seizure activity during these spells by [facility], with improved episodes with alterations of his seizure therapy. The tachycardia may have been provoked by this seizure activity.   - family denies any recent issues with severely elevated heart rates. He has chronic mild sinus tachycardia   2. Cerebal palsy - followed at [facility] neuro   Past Medical History  Diagnosis Date  . Cerebral palsy     with spastic quadriparesis with numerous orthopedic procedures for contractures and fratures  . Tachycardia     chronic  . Hypoxemia     chronic  . Chronic pulmonary aspiration   . Protein calorie malnutrition     chronic  . Hydrocephalus     s/p VP shunt  . Chronic constipation   . Iron deficiency anemia   . CSA (central sleep apnea)   . OSA (obstructive sleep apnea)   . GERD (gastroesophageal reflux disease)      Allergies  Allergen Reactions  . Whole Blood   . Cefazolin Rash  . Midazolam Hcl Other (See Comments)    Un-reactive pupils after surgery  . Septra [Bactrim] Hives  . Vancomycin Rash     Current Outpatient Prescriptions  Medication Sig Dispense Refill  . amoxicillin (AMOXIL) 500 MG capsule Take 4 capsules by mouth as directed. Prior to dental procedures    . BACLOFEN IT 1,449 mcg by Intrathecal route daily.    Marland Kitchen ketoconazole (NIZORAL) 2 % shampoo Apply 1 application topically as needed.    . lamoTRIgine  (LAMICTAL) 25 MG tablet Take 75 mg by mouth 2 (two) times daily.    Marland Kitchen LINZESS 145 MCG CAPS Take 145 mcg by mouth daily.     Marland Kitchen nystatin (MYCOSTATIN) 100000 UNIT/ML suspension Take 100,000 Units by mouth 2 (two) times daily.     Marland Kitchen omeprazole (PRILOSEC) 20 MG capsule Take 20 mg by mouth 2 (two) times daily.    . polyethylene glycol (MIRALAX / GLYCOLAX) packet Take 8.5 g by mouth daily as needed.    . ranitidine (ZANTAC) 150 MG capsule Take 150 mg by mouth every evening.    . sodium chloride HYPERTONIC 3 % nebulizer solution Take by nebulization 2 (two) times daily.    . sucralfate (CARAFATE) 1 GM/10ML suspension Take 500 mg by mouth 2 (two) times daily.    . tamsulosin (FLOMAX) 0.4 MG CAPS capsule Take 1 mg by mouth daily.    Marland Kitchen tretinoin (RETIN-A) 0.025 % cream Apply a a thin layer to affected areas nightly 20 min after washing. Start with two nights weekly and increase as tolerated.    . Urea (X-VIATE) 40 % LOTN APPLY 1-2 TIMES DAILY TO KERTOSIS PILARIS ON ARMS    . VIMPAT 50 MG TABS tablet Take 2 tablets by mouth 2 (two) times daily.  0   No current facility-administered medications for this visit.     Past Surgical History  Procedure Laterality Date  . Csf shunt    .  Ventricular atrial shunt    . Nephrectomy      [facility]  . Eye surgery      MULTIPLE  . Hip surgery      MULTIPLE  . Back surgery      HARRINGTON RODS PLACED FOR SCOLIOSIS  . Inguinal hernia repair      BILATERAL  . Baclofen pump placement       Allergies  Allergen Reactions  . Whole Blood   . Cefazolin Rash  . Midazolam Hcl Other (See Comments)    Un-reactive pupils after surgery  . Septra [Bactrim] Hives  . Vancomycin Rash      Family History  Problem Relation Age of Onset  . Colon cancer Father   . Hypertension Father   . Osteoporosis Mother   . Osteoarthritis Mother     in her back     Social History Joshua Logan reports that he has never smoked. He has never used smokeless tobacco. Mr.  Joshua Logan reports that he does not drink alcohol.   Review of Systems Unable to collect, patient is nonverbal  .   Physical Examination Filed Vitals:   01/21/15 1439  BP: 126/88  Pulse: 105   Filed Vitals:   01/21/15 1439  Height: 5\' 4"  (1.626 m)  Weight: 110 lb (49.896 kg)    Gen: resting comfortably, no acute distress HEENT: no scleral icterus, pupils equal round and reactive, no palptable cervical adenopathy,  CV: RRR, no m/r/g, no jvd Resp: Clear to auscultation bilaterally GI: abdomen is soft, non-tender, non-distended, normal bowel sounds, no hepatosplenomegaly MSK: extremities are warm, no edema.  Skin: warm, no rash Neuro:  Diffuse spasticity Psych: appropriate affect   Diagnostic Studies 02/2012 Echo  LVEF 60-65%, grade I diastolic dysfunction, normal LA size,      Assessment and Plan  1. PSVT/Tachycardia - appears to be primarily related to seizure activity.  - no recent symptoms - continue to follow   F/u as needed      Joshua Logan, M.D.

## 2016-02-23 DIAGNOSIS — G808 Other cerebral palsy: Secondary | ICD-10-CM | POA: Diagnosis not present

## 2016-03-08 DIAGNOSIS — Z7409 Other reduced mobility: Secondary | ICD-10-CM | POA: Diagnosis not present

## 2016-03-08 DIAGNOSIS — G809 Cerebral palsy, unspecified: Secondary | ICD-10-CM | POA: Diagnosis not present

## 2016-03-24 DIAGNOSIS — K5901 Slow transit constipation: Secondary | ICD-10-CM | POA: Diagnosis not present

## 2016-03-24 DIAGNOSIS — L858 Other specified epidermal thickening: Secondary | ICD-10-CM | POA: Diagnosis not present

## 2016-03-24 DIAGNOSIS — L219 Seborrheic dermatitis, unspecified: Secondary | ICD-10-CM | POA: Diagnosis not present

## 2016-03-24 DIAGNOSIS — L739 Follicular disorder, unspecified: Secondary | ICD-10-CM | POA: Diagnosis not present

## 2016-03-24 DIAGNOSIS — K219 Gastro-esophageal reflux disease without esophagitis: Secondary | ICD-10-CM | POA: Diagnosis not present

## 2016-03-24 DIAGNOSIS — L918 Other hypertrophic disorders of the skin: Secondary | ICD-10-CM | POA: Diagnosis not present

## 2016-03-24 DIAGNOSIS — R1314 Dysphagia, pharyngoesophageal phase: Secondary | ICD-10-CM | POA: Diagnosis not present

## 2016-03-24 DIAGNOSIS — L720 Epidermal cyst: Secondary | ICD-10-CM | POA: Diagnosis not present

## 2016-03-28 DIAGNOSIS — Z982 Presence of cerebrospinal fluid drainage device: Secondary | ICD-10-CM | POA: Diagnosis not present

## 2016-03-28 DIAGNOSIS — G919 Hydrocephalus, unspecified: Secondary | ICD-10-CM | POA: Diagnosis not present

## 2016-03-28 DIAGNOSIS — G800 Spastic quadriplegic cerebral palsy: Secondary | ICD-10-CM | POA: Diagnosis not present

## 2016-03-28 DIAGNOSIS — Z451 Encounter for adjustment and management of infusion pump: Secondary | ICD-10-CM | POA: Diagnosis not present

## 2016-03-28 DIAGNOSIS — G808 Other cerebral palsy: Secondary | ICD-10-CM | POA: Diagnosis not present

## 2016-05-11 DIAGNOSIS — N319 Neuromuscular dysfunction of bladder, unspecified: Secondary | ICD-10-CM | POA: Diagnosis not present

## 2016-05-11 DIAGNOSIS — Q600 Renal agenesis, unilateral: Secondary | ICD-10-CM | POA: Diagnosis not present

## 2016-05-11 DIAGNOSIS — G809 Cerebral palsy, unspecified: Secondary | ICD-10-CM | POA: Diagnosis not present

## 2016-05-11 DIAGNOSIS — N200 Calculus of kidney: Secondary | ICD-10-CM | POA: Diagnosis not present

## 2016-05-19 DIAGNOSIS — R1314 Dysphagia, pharyngoesophageal phase: Secondary | ICD-10-CM | POA: Diagnosis not present

## 2016-05-19 DIAGNOSIS — K219 Gastro-esophageal reflux disease without esophagitis: Secondary | ICD-10-CM | POA: Diagnosis not present

## 2016-05-19 DIAGNOSIS — G800 Spastic quadriplegic cerebral palsy: Secondary | ICD-10-CM | POA: Diagnosis not present

## 2016-06-01 ENCOUNTER — Encounter: Payer: Self-pay | Admitting: Cardiology

## 2016-06-01 DIAGNOSIS — R0989 Other specified symptoms and signs involving the circulatory and respiratory systems: Secondary | ICD-10-CM | POA: Diagnosis not present

## 2016-06-01 DIAGNOSIS — R05 Cough: Secondary | ICD-10-CM | POA: Diagnosis not present

## 2016-06-01 DIAGNOSIS — G800 Spastic quadriplegic cerebral palsy: Secondary | ICD-10-CM | POA: Diagnosis not present

## 2016-06-01 DIAGNOSIS — R4701 Aphasia: Secondary | ICD-10-CM | POA: Diagnosis not present

## 2016-06-01 DIAGNOSIS — R918 Other nonspecific abnormal finding of lung field: Secondary | ICD-10-CM | POA: Diagnosis not present

## 2016-06-01 DIAGNOSIS — G40319 Generalized idiopathic epilepsy and epileptic syndromes, intractable, without status epilepticus: Secondary | ICD-10-CM | POA: Diagnosis not present

## 2016-06-01 DIAGNOSIS — R609 Edema, unspecified: Secondary | ICD-10-CM | POA: Diagnosis not present

## 2016-06-09 ENCOUNTER — Telehealth: Payer: Self-pay | Admitting: Cardiology

## 2016-06-09 DIAGNOSIS — R0602 Shortness of breath: Secondary | ICD-10-CM

## 2016-06-09 NOTE — Telephone Encounter (Signed)
Pt recently went to [place] for feet swelling and some swelling in the left arm with 02 stats has been running 89-93 w/ SOB for the last 2 weeks - [facility] last Wednesday for pneumonia (will request) - pt is having procedure 06/27/16 - pt mother would like Dr Wyline MoodBranch to evaluate pt prior to procedure thinks he may need echo. 1st available appt for Dr Wyline MoodBranch is 6/8. Routed to Dr Wyline MoodBranch

## 2016-06-09 NOTE — Telephone Encounter (Signed)
[name] called in reference to her son coming back to seeing Dr Wyline MoodBranch.  He has recently been experiencing multiple issues including feet swelling.   She stated that Dr Orson AloeHenderson advised her to contact our office and see if Dr Wyline MoodBranch needs to see patient.

## 2016-06-09 NOTE — Telephone Encounter (Signed)
Lets get an echo for him early next week for SOB, based on results can look to schedule an earlier appointment. If nothing in [place] see if they are willing to come [facility]   J Avonna Iribe MD

## 2016-06-10 ENCOUNTER — Encounter: Payer: Self-pay | Admitting: *Deleted

## 2016-06-10 ENCOUNTER — Telehealth: Payer: Self-pay | Admitting: Cardiology

## 2016-06-10 NOTE — Telephone Encounter (Signed)
Pre-cert Verification for the following procedure   E[facility] scheduled [facility]/16 at [facility]

## 2016-06-10 NOTE — Telephone Encounter (Signed)
Pt mother agreeable to echo at [facility] - will forward to schedulers - order placed

## 2016-06-14 ENCOUNTER — Telehealth: Payer: Self-pay | Admitting: Cardiology

## 2016-06-14 ENCOUNTER — Ambulatory Visit (HOSPITAL_COMMUNITY)
Admission: RE | Admit: 2016-06-14 | Discharge: 2016-06-14 | Disposition: A | Discharge status: Home / Self Care | Payer: Medicare Other | Source: Ambulatory Visit | Attending: Cardiology | Admitting: Cardiology

## 2016-06-14 DIAGNOSIS — G4733 Obstructive sleep apnea (adult) (pediatric): Secondary | ICD-10-CM | POA: Insufficient documentation

## 2016-06-14 DIAGNOSIS — Z993 Dependence on wheelchair: Secondary | ICD-10-CM | POA: Insufficient documentation

## 2016-06-14 DIAGNOSIS — G809 Cerebral palsy, unspecified: Secondary | ICD-10-CM | POA: Insufficient documentation

## 2016-06-14 DIAGNOSIS — R0602 Shortness of breath: Secondary | ICD-10-CM

## 2016-06-14 NOTE — Telephone Encounter (Signed)
Advised Mrs. Manera that we did not have test results at this time, but would call with results as soon as we did.

## 2016-06-14 NOTE — Telephone Encounter (Signed)
Joshua Logan called requesting to see if she can get test results of Echo done on 06/14/16.

## 2016-06-14 NOTE — Progress Notes (Signed)
*  PRELIMINARY RESULTS* Echocardiogram 2D Echocardiogram has been performed.  Stacey DrainWhite, Flynn Gwyn J 06/14/2016, 10:30 AM

## 2016-06-16 ENCOUNTER — Telehealth: Payer: Self-pay | Admitting: *Deleted

## 2016-06-16 NOTE — Telephone Encounter (Signed)
-----   Message from Antoine PocheJonathan F Branch, MD sent at 06/16/2016  2:31 PM EDT ----- Heart function is very strong and normal, I don't see anything that would be causing his symptoms from a heart standpoint. I would continue to follow with his pcp for management, test is reassuring there is no heart issue.   Dominga FerryJ Branch MD

## 2016-06-16 NOTE — Telephone Encounter (Signed)
Pt mother aware - routed to Dr Orson AloeHenderson and Dr Dimas AguasHoward

## 2016-06-16 NOTE — Telephone Encounter (Signed)
Pt mother requesting echo results - made aware we would call after MD review

## 2016-06-16 NOTE — Telephone Encounter (Signed)
Please see echo result note   J Dayna Alia MD

## 2016-06-20 DIAGNOSIS — R0602 Shortness of breath: Secondary | ICD-10-CM | POA: Diagnosis not present

## 2016-06-20 DIAGNOSIS — J9811 Atelectasis: Secondary | ICD-10-CM | POA: Diagnosis not present

## 2016-06-21 DIAGNOSIS — G800 Spastic quadriplegic cerebral palsy: Secondary | ICD-10-CM | POA: Diagnosis not present

## 2016-06-21 DIAGNOSIS — G40219 Localization-related (focal) (partial) symptomatic epilepsy and epileptic syndromes with complex partial seizures, intractable, without status epilepticus: Secondary | ICD-10-CM | POA: Diagnosis not present

## 2016-06-21 DIAGNOSIS — G808 Other cerebral palsy: Secondary | ICD-10-CM | POA: Diagnosis not present

## 2016-06-27 DIAGNOSIS — G800 Spastic quadriplegic cerebral palsy: Secondary | ICD-10-CM | POA: Diagnosis not present

## 2016-06-27 DIAGNOSIS — K219 Gastro-esophageal reflux disease without esophagitis: Secondary | ICD-10-CM | POA: Diagnosis not present

## 2016-06-27 DIAGNOSIS — Z87440 Personal history of urinary (tract) infections: Secondary | ICD-10-CM | POA: Diagnosis not present

## 2016-06-27 DIAGNOSIS — Z4542 Encounter for adjustment and management of neuropacemaker (brain) (peripheral nerve) (spinal cord): Secondary | ICD-10-CM | POA: Diagnosis not present

## 2016-06-27 DIAGNOSIS — G919 Hydrocephalus, unspecified: Secondary | ICD-10-CM | POA: Diagnosis not present

## 2016-06-27 DIAGNOSIS — G4731 Primary central sleep apnea: Secondary | ICD-10-CM | POA: Diagnosis not present

## 2016-06-27 DIAGNOSIS — N319 Neuromuscular dysfunction of bladder, unspecified: Secondary | ICD-10-CM | POA: Diagnosis not present

## 2016-06-27 DIAGNOSIS — K5900 Constipation, unspecified: Secondary | ICD-10-CM | POA: Diagnosis not present

## 2016-06-28 DIAGNOSIS — K219 Gastro-esophageal reflux disease without esophagitis: Secondary | ICD-10-CM | POA: Diagnosis not present

## 2016-06-28 DIAGNOSIS — G800 Spastic quadriplegic cerebral palsy: Secondary | ICD-10-CM | POA: Diagnosis not present

## 2016-06-28 DIAGNOSIS — N319 Neuromuscular dysfunction of bladder, unspecified: Secondary | ICD-10-CM | POA: Diagnosis not present

## 2016-06-28 DIAGNOSIS — G4731 Primary central sleep apnea: Secondary | ICD-10-CM | POA: Diagnosis not present

## 2016-06-28 DIAGNOSIS — G919 Hydrocephalus, unspecified: Secondary | ICD-10-CM | POA: Diagnosis not present

## 2016-06-28 DIAGNOSIS — K5900 Constipation, unspecified: Secondary | ICD-10-CM | POA: Diagnosis not present

## 2016-06-29 DIAGNOSIS — L7682 Other postprocedural complications of skin and subcutaneous tissue: Secondary | ICD-10-CM | POA: Diagnosis not present

## 2016-06-29 DIAGNOSIS — G808 Other cerebral palsy: Secondary | ICD-10-CM | POA: Diagnosis not present

## 2016-06-29 DIAGNOSIS — R21 Rash and other nonspecific skin eruption: Secondary | ICD-10-CM | POA: Diagnosis not present

## 2016-07-08 DIAGNOSIS — G919 Hydrocephalus, unspecified: Secondary | ICD-10-CM | POA: Diagnosis not present

## 2016-07-08 DIAGNOSIS — G808 Other cerebral palsy: Secondary | ICD-10-CM | POA: Diagnosis not present

## 2016-07-17 DIAGNOSIS — G809 Cerebral palsy, unspecified: Secondary | ICD-10-CM | POA: Diagnosis not present

## 2016-08-13 ENCOUNTER — Encounter: Payer: Self-pay | Admitting: Cardiology

## 2016-08-13 DIAGNOSIS — G800 Spastic quadriplegic cerebral palsy: Secondary | ICD-10-CM | POA: Diagnosis not present

## 2016-08-13 DIAGNOSIS — I471 Supraventricular tachycardia: Secondary | ICD-10-CM | POA: Diagnosis not present

## 2016-08-13 DIAGNOSIS — N390 Urinary tract infection, site not specified: Secondary | ICD-10-CM | POA: Diagnosis not present

## 2016-08-13 DIAGNOSIS — G919 Hydrocephalus, unspecified: Secondary | ICD-10-CM | POA: Diagnosis not present

## 2016-08-13 DIAGNOSIS — Z982 Presence of cerebrospinal fluid drainage device: Secondary | ICD-10-CM | POA: Diagnosis not present

## 2016-08-13 DIAGNOSIS — R05 Cough: Secondary | ICD-10-CM | POA: Diagnosis not present

## 2016-08-13 DIAGNOSIS — N319 Neuromuscular dysfunction of bladder, unspecified: Secondary | ICD-10-CM | POA: Diagnosis not present

## 2016-08-13 DIAGNOSIS — J9811 Atelectasis: Secondary | ICD-10-CM | POA: Diagnosis not present

## 2016-08-13 DIAGNOSIS — G4731 Primary central sleep apnea: Secondary | ICD-10-CM | POA: Diagnosis not present

## 2016-08-13 DIAGNOSIS — I517 Cardiomegaly: Secondary | ICD-10-CM | POA: Diagnosis not present

## 2016-08-13 DIAGNOSIS — G40319 Generalized idiopathic epilepsy and epileptic syndromes, intractable, without status epilepticus: Secondary | ICD-10-CM | POA: Diagnosis not present

## 2016-08-13 DIAGNOSIS — R252 Cramp and spasm: Secondary | ICD-10-CM | POA: Diagnosis not present

## 2016-08-13 DIAGNOSIS — R000 Tachycardia, unspecified: Secondary | ICD-10-CM | POA: Diagnosis not present

## 2016-08-13 DIAGNOSIS — R509 Fever, unspecified: Secondary | ICD-10-CM | POA: Diagnosis not present

## 2016-08-17 DIAGNOSIS — N390 Urinary tract infection, site not specified: Secondary | ICD-10-CM | POA: Diagnosis not present

## 2016-08-18 DIAGNOSIS — N390 Urinary tract infection, site not specified: Secondary | ICD-10-CM | POA: Diagnosis not present

## 2016-08-18 DIAGNOSIS — G808 Other cerebral palsy: Secondary | ICD-10-CM | POA: Diagnosis not present

## 2016-09-27 DIAGNOSIS — G800 Spastic quadriplegic cerebral palsy: Secondary | ICD-10-CM | POA: Diagnosis not present

## 2016-09-27 DIAGNOSIS — L858 Other specified epidermal thickening: Secondary | ICD-10-CM | POA: Diagnosis not present

## 2016-09-27 DIAGNOSIS — L219 Seborrheic dermatitis, unspecified: Secondary | ICD-10-CM | POA: Diagnosis not present

## 2016-09-27 DIAGNOSIS — L739 Follicular disorder, unspecified: Secondary | ICD-10-CM | POA: Diagnosis not present

## 2016-09-27 DIAGNOSIS — L720 Epidermal cyst: Secondary | ICD-10-CM | POA: Diagnosis not present

## 2016-10-11 DIAGNOSIS — G808 Other cerebral palsy: Secondary | ICD-10-CM | POA: Diagnosis not present

## 2016-10-24 DIAGNOSIS — G4733 Obstructive sleep apnea (adult) (pediatric): Secondary | ICD-10-CM | POA: Diagnosis not present

## 2016-10-24 DIAGNOSIS — G809 Cerebral palsy, unspecified: Secondary | ICD-10-CM | POA: Diagnosis not present

## 2016-10-24 DIAGNOSIS — R0689 Other abnormalities of breathing: Secondary | ICD-10-CM | POA: Diagnosis not present

## 2016-11-08 DIAGNOSIS — Z23 Encounter for immunization: Secondary | ICD-10-CM | POA: Diagnosis not present

## 2016-11-14 DIAGNOSIS — Z79899 Other long term (current) drug therapy: Secondary | ICD-10-CM | POA: Diagnosis not present

## 2016-11-14 DIAGNOSIS — G919 Hydrocephalus, unspecified: Secondary | ICD-10-CM | POA: Diagnosis not present

## 2016-11-14 DIAGNOSIS — G808 Other cerebral palsy: Secondary | ICD-10-CM | POA: Diagnosis not present

## 2016-11-14 DIAGNOSIS — Z982 Presence of cerebrospinal fluid drainage device: Secondary | ICD-10-CM | POA: Diagnosis not present

## 2016-11-14 DIAGNOSIS — G800 Spastic quadriplegic cerebral palsy: Secondary | ICD-10-CM | POA: Diagnosis not present

## 2016-11-14 DIAGNOSIS — Z451 Encounter for adjustment and management of infusion pump: Secondary | ICD-10-CM | POA: Diagnosis not present

## 2016-12-15 DIAGNOSIS — K219 Gastro-esophageal reflux disease without esophagitis: Secondary | ICD-10-CM | POA: Diagnosis not present

## 2016-12-15 DIAGNOSIS — R1312 Dysphagia, oropharyngeal phase: Secondary | ICD-10-CM | POA: Diagnosis not present

## 2016-12-15 DIAGNOSIS — G808 Other cerebral palsy: Secondary | ICD-10-CM | POA: Diagnosis not present

## 2016-12-15 DIAGNOSIS — K5901 Slow transit constipation: Secondary | ICD-10-CM | POA: Diagnosis not present

## 2017-01-03 DIAGNOSIS — G800 Spastic quadriplegic cerebral palsy: Secondary | ICD-10-CM | POA: Diagnosis not present

## 2017-01-03 DIAGNOSIS — G808 Other cerebral palsy: Secondary | ICD-10-CM | POA: Diagnosis not present

## 2017-02-18 DIAGNOSIS — H109 Unspecified conjunctivitis: Secondary | ICD-10-CM | POA: Diagnosis not present

## 2017-02-27 DIAGNOSIS — R05 Cough: Secondary | ICD-10-CM | POA: Diagnosis not present

## 2017-10-14 DIAGNOSIS — R05 Cough: Secondary | ICD-10-CM | POA: Diagnosis not present

## 2017-10-14 DIAGNOSIS — N390 Urinary tract infection, site not specified: Secondary | ICD-10-CM | POA: Diagnosis not present

## 2019-01-18 DIAGNOSIS — R1310 Dysphagia, unspecified: Secondary | ICD-10-CM | POA: Diagnosis not present

## 2019-01-18 DIAGNOSIS — G809 Cerebral palsy, unspecified: Secondary | ICD-10-CM | POA: Diagnosis not present

## 2019-01-18 DIAGNOSIS — J45901 Unspecified asthma with (acute) exacerbation: Secondary | ICD-10-CM | POA: Diagnosis not present

## 2019-01-18 DIAGNOSIS — R32 Unspecified urinary incontinence: Secondary | ICD-10-CM | POA: Diagnosis not present

## 2019-02-05 DIAGNOSIS — G808 Other cerebral palsy: Secondary | ICD-10-CM | POA: Diagnosis not present

## 2019-02-18 DIAGNOSIS — J45901 Unspecified asthma with (acute) exacerbation: Secondary | ICD-10-CM | POA: Diagnosis not present

## 2019-02-18 DIAGNOSIS — R32 Unspecified urinary incontinence: Secondary | ICD-10-CM | POA: Diagnosis not present

## 2019-02-18 DIAGNOSIS — G809 Cerebral palsy, unspecified: Secondary | ICD-10-CM | POA: Diagnosis not present

## 2019-02-18 DIAGNOSIS — R1310 Dysphagia, unspecified: Secondary | ICD-10-CM | POA: Diagnosis not present

## 2019-02-26 DIAGNOSIS — G808 Other cerebral palsy: Secondary | ICD-10-CM | POA: Diagnosis not present

## 2019-03-12 DIAGNOSIS — G40219 Localization-related (focal) (partial) symptomatic epilepsy and epileptic syndromes with complex partial seizures, intractable, without status epilepticus: Secondary | ICD-10-CM | POA: Diagnosis not present

## 2019-03-18 DIAGNOSIS — J45901 Unspecified asthma with (acute) exacerbation: Secondary | ICD-10-CM | POA: Diagnosis not present

## 2019-03-18 DIAGNOSIS — R1310 Dysphagia, unspecified: Secondary | ICD-10-CM | POA: Diagnosis not present

## 2019-03-18 DIAGNOSIS — R32 Unspecified urinary incontinence: Secondary | ICD-10-CM | POA: Diagnosis not present

## 2019-03-18 DIAGNOSIS — G809 Cerebral palsy, unspecified: Secondary | ICD-10-CM | POA: Diagnosis not present

## 2019-03-20 DIAGNOSIS — G808 Other cerebral palsy: Secondary | ICD-10-CM | POA: Diagnosis not present

## 2019-04-18 DIAGNOSIS — R32 Unspecified urinary incontinence: Secondary | ICD-10-CM | POA: Diagnosis not present

## 2019-04-18 DIAGNOSIS — G809 Cerebral palsy, unspecified: Secondary | ICD-10-CM | POA: Diagnosis not present

## 2019-04-18 DIAGNOSIS — J45901 Unspecified asthma with (acute) exacerbation: Secondary | ICD-10-CM | POA: Diagnosis not present

## 2019-04-18 DIAGNOSIS — R1310 Dysphagia, unspecified: Secondary | ICD-10-CM | POA: Diagnosis not present

## 2019-05-06 DIAGNOSIS — G40909 Epilepsy, unspecified, not intractable, without status epilepticus: Secondary | ICD-10-CM | POA: Diagnosis not present

## 2019-05-06 DIAGNOSIS — K5900 Constipation, unspecified: Secondary | ICD-10-CM | POA: Diagnosis not present

## 2019-05-06 DIAGNOSIS — K219 Gastro-esophageal reflux disease without esophagitis: Secondary | ICD-10-CM | POA: Diagnosis not present

## 2019-05-06 DIAGNOSIS — Z451 Encounter for adjustment and management of infusion pump: Secondary | ICD-10-CM | POA: Diagnosis not present

## 2019-05-06 DIAGNOSIS — N319 Neuromuscular dysfunction of bladder, unspecified: Secondary | ICD-10-CM | POA: Diagnosis not present

## 2019-05-06 DIAGNOSIS — G4731 Primary central sleep apnea: Secondary | ICD-10-CM | POA: Diagnosis not present

## 2019-05-06 DIAGNOSIS — G910 Communicating hydrocephalus: Secondary | ICD-10-CM | POA: Diagnosis not present

## 2019-05-06 DIAGNOSIS — Z87440 Personal history of urinary (tract) infections: Secondary | ICD-10-CM | POA: Diagnosis not present

## 2019-05-06 DIAGNOSIS — G808 Other cerebral palsy: Secondary | ICD-10-CM | POA: Diagnosis not present

## 2019-05-18 DIAGNOSIS — G809 Cerebral palsy, unspecified: Secondary | ICD-10-CM | POA: Diagnosis not present

## 2019-05-18 DIAGNOSIS — R32 Unspecified urinary incontinence: Secondary | ICD-10-CM | POA: Diagnosis not present

## 2019-05-18 DIAGNOSIS — R1310 Dysphagia, unspecified: Secondary | ICD-10-CM | POA: Diagnosis not present

## 2019-05-18 DIAGNOSIS — J45901 Unspecified asthma with (acute) exacerbation: Secondary | ICD-10-CM | POA: Diagnosis not present

## 2019-05-21 DIAGNOSIS — M85859 Other specified disorders of bone density and structure, unspecified thigh: Secondary | ICD-10-CM | POA: Diagnosis not present

## 2019-05-21 DIAGNOSIS — G808 Other cerebral palsy: Secondary | ICD-10-CM | POA: Diagnosis not present

## 2019-05-28 DIAGNOSIS — G4733 Obstructive sleep apnea (adult) (pediatric): Secondary | ICD-10-CM | POA: Diagnosis not present

## 2019-05-28 DIAGNOSIS — G809 Cerebral palsy, unspecified: Secondary | ICD-10-CM | POA: Diagnosis not present

## 2019-05-28 DIAGNOSIS — J301 Allergic rhinitis due to pollen: Secondary | ICD-10-CM | POA: Diagnosis not present

## 2019-05-29 DIAGNOSIS — G919 Hydrocephalus, unspecified: Secondary | ICD-10-CM | POA: Diagnosis not present

## 2019-05-29 DIAGNOSIS — G40319 Generalized idiopathic epilepsy and epileptic syndromes, intractable, without status epilepticus: Secondary | ICD-10-CM | POA: Diagnosis not present

## 2019-05-29 DIAGNOSIS — I6789 Other cerebrovascular disease: Secondary | ICD-10-CM | POA: Diagnosis not present

## 2019-06-12 DIAGNOSIS — G4731 Primary central sleep apnea: Secondary | ICD-10-CM | POA: Diagnosis not present

## 2019-06-18 DIAGNOSIS — R1310 Dysphagia, unspecified: Secondary | ICD-10-CM | POA: Diagnosis not present

## 2019-06-18 DIAGNOSIS — G809 Cerebral palsy, unspecified: Secondary | ICD-10-CM | POA: Diagnosis not present

## 2019-06-18 DIAGNOSIS — J45901 Unspecified asthma with (acute) exacerbation: Secondary | ICD-10-CM | POA: Diagnosis not present

## 2019-06-18 DIAGNOSIS — R32 Unspecified urinary incontinence: Secondary | ICD-10-CM | POA: Diagnosis not present

## 2019-06-25 DIAGNOSIS — Z451 Encounter for adjustment and management of infusion pump: Secondary | ICD-10-CM | POA: Diagnosis not present

## 2019-06-25 DIAGNOSIS — G808 Other cerebral palsy: Secondary | ICD-10-CM | POA: Diagnosis not present

## 2019-06-30 DIAGNOSIS — N3000 Acute cystitis without hematuria: Secondary | ICD-10-CM | POA: Diagnosis not present

## 2019-06-30 DIAGNOSIS — A419 Sepsis, unspecified organism: Secondary | ICD-10-CM | POA: Diagnosis not present

## 2019-06-30 DIAGNOSIS — G809 Cerebral palsy, unspecified: Secondary | ICD-10-CM | POA: Diagnosis not present

## 2019-06-30 DIAGNOSIS — N390 Urinary tract infection, site not specified: Secondary | ICD-10-CM | POA: Diagnosis not present

## 2019-06-30 DIAGNOSIS — R509 Fever, unspecified: Secondary | ICD-10-CM | POA: Diagnosis not present

## 2019-06-30 DIAGNOSIS — D72829 Elevated white blood cell count, unspecified: Secondary | ICD-10-CM | POA: Diagnosis not present

## 2019-06-30 DIAGNOSIS — Z87891 Personal history of nicotine dependence: Secondary | ICD-10-CM | POA: Diagnosis not present

## 2019-06-30 DIAGNOSIS — R6510 Systemic inflammatory response syndrome (SIRS) of non-infectious origin without acute organ dysfunction: Secondary | ICD-10-CM | POA: Diagnosis not present

## 2019-06-30 DIAGNOSIS — T50905A Adverse effect of unspecified drugs, medicaments and biological substances, initial encounter: Secondary | ICD-10-CM | POA: Diagnosis not present

## 2019-07-16 DIAGNOSIS — G40219 Localization-related (focal) (partial) symptomatic epilepsy and epileptic syndromes with complex partial seizures, intractable, without status epilepticus: Secondary | ICD-10-CM | POA: Diagnosis not present

## 2019-07-16 DIAGNOSIS — G808 Other cerebral palsy: Secondary | ICD-10-CM | POA: Diagnosis not present

## 2019-07-18 DIAGNOSIS — G809 Cerebral palsy, unspecified: Secondary | ICD-10-CM | POA: Diagnosis not present

## 2019-07-18 DIAGNOSIS — R1310 Dysphagia, unspecified: Secondary | ICD-10-CM | POA: Diagnosis not present

## 2019-07-18 DIAGNOSIS — J45901 Unspecified asthma with (acute) exacerbation: Secondary | ICD-10-CM | POA: Diagnosis not present

## 2019-07-18 DIAGNOSIS — R32 Unspecified urinary incontinence: Secondary | ICD-10-CM | POA: Diagnosis not present

## 2019-07-27 DIAGNOSIS — N3000 Acute cystitis without hematuria: Secondary | ICD-10-CM | POA: Diagnosis not present

## 2019-07-30 DIAGNOSIS — L738 Other specified follicular disorders: Secondary | ICD-10-CM | POA: Diagnosis not present

## 2019-07-30 DIAGNOSIS — L821 Other seborrheic keratosis: Secondary | ICD-10-CM | POA: Diagnosis not present

## 2019-07-30 DIAGNOSIS — L858 Other specified epidermal thickening: Secondary | ICD-10-CM | POA: Diagnosis not present

## 2019-07-30 DIAGNOSIS — L219 Seborrheic dermatitis, unspecified: Secondary | ICD-10-CM | POA: Diagnosis not present

## 2019-08-06 DIAGNOSIS — R05 Cough: Secondary | ICD-10-CM | POA: Diagnosis not present

## 2019-08-06 DIAGNOSIS — Z881 Allergy status to other antibiotic agents status: Secondary | ICD-10-CM | POA: Diagnosis not present

## 2019-08-06 DIAGNOSIS — G808 Other cerebral palsy: Secondary | ICD-10-CM | POA: Diagnosis not present

## 2019-08-06 DIAGNOSIS — N390 Urinary tract infection, site not specified: Secondary | ICD-10-CM | POA: Diagnosis not present

## 2019-08-06 DIAGNOSIS — R9431 Abnormal electrocardiogram [ECG] [EKG]: Secondary | ICD-10-CM | POA: Diagnosis not present

## 2019-08-06 DIAGNOSIS — J9811 Atelectasis: Secondary | ICD-10-CM | POA: Diagnosis not present

## 2019-08-06 DIAGNOSIS — Z882 Allergy status to sulfonamides status: Secondary | ICD-10-CM | POA: Diagnosis not present

## 2019-08-06 DIAGNOSIS — Z4549 Encounter for adjustment and management of other implanted nervous system device: Secondary | ICD-10-CM | POA: Diagnosis not present

## 2019-08-06 DIAGNOSIS — R000 Tachycardia, unspecified: Secondary | ICD-10-CM | POA: Diagnosis not present

## 2019-08-12 DIAGNOSIS — R300 Dysuria: Secondary | ICD-10-CM | POA: Diagnosis not present

## 2019-08-14 DIAGNOSIS — G800 Spastic quadriplegic cerebral palsy: Secondary | ICD-10-CM | POA: Diagnosis not present

## 2019-08-18 DIAGNOSIS — G809 Cerebral palsy, unspecified: Secondary | ICD-10-CM | POA: Diagnosis not present

## 2019-08-18 DIAGNOSIS — R32 Unspecified urinary incontinence: Secondary | ICD-10-CM | POA: Diagnosis not present

## 2019-08-18 DIAGNOSIS — R1310 Dysphagia, unspecified: Secondary | ICD-10-CM | POA: Diagnosis not present

## 2019-08-18 DIAGNOSIS — J45901 Unspecified asthma with (acute) exacerbation: Secondary | ICD-10-CM | POA: Diagnosis not present

## 2019-08-20 DIAGNOSIS — G808 Other cerebral palsy: Secondary | ICD-10-CM | POA: Diagnosis not present

## 2019-08-20 DIAGNOSIS — Z79899 Other long term (current) drug therapy: Secondary | ICD-10-CM | POA: Diagnosis not present

## 2019-08-20 DIAGNOSIS — N319 Neuromuscular dysfunction of bladder, unspecified: Secondary | ICD-10-CM | POA: Diagnosis not present

## 2019-08-20 DIAGNOSIS — Z905 Acquired absence of kidney: Secondary | ICD-10-CM | POA: Diagnosis not present

## 2019-09-06 ENCOUNTER — Other Ambulatory Visit: Payer: Self-pay

## 2019-09-06 ENCOUNTER — Ambulatory Visit (INDEPENDENT_AMBULATORY_CARE_PROVIDER_SITE_OTHER): Payer: Medicare PPO | Admitting: Cardiology

## 2019-09-06 ENCOUNTER — Encounter: Payer: Self-pay | Admitting: Cardiology

## 2019-09-06 VITALS — BP 146/80 | HR 106 | Ht 64.0 in | Wt 114.0 lb

## 2019-09-06 DIAGNOSIS — I10 Essential (primary) hypertension: Secondary | ICD-10-CM

## 2019-09-06 DIAGNOSIS — R000 Tachycardia, unspecified: Secondary | ICD-10-CM | POA: Diagnosis not present

## 2019-09-06 MED ORDER — DILTIAZEM HCL 30 MG PO TABS: 30.0000 mg | ORAL_TABLET | Freq: Two times a day (BID) | ORAL | 11 refills | Status: DC

## 2019-09-06 NOTE — Progress Notes (Signed)
Clinical Summary Mr. Spellman is a 39 y.o.male seen today for follow up of the following medical problems.   1. Paroxysmal supraventricular tachycardia/Chronic tachycardia - discovered initially during workup for abnormal "spells" the patient was having per notes with cardiac monitoring. - prior echo shows normal LVEF with no other significant abnormalities - tried on metoprolol however per report seemed to have increased frequency of episodes.  - he was later found to be having seizure activity during these spells by [facility], with improved episodes with alterations of his seizure therapy. The tachycardia may have been provoked by this seizure activity.   -occasional high heart rates at times at doctors visits - no indication of associated symptoms  07/2019 labs: WBC 20 08/2019 Cr 0.9  05/2019 TSH 2    2. Cerebal palsy - followed at [facility] neuro   3. Elevated bp - home bp's 120s-150s/80-100s, HRs variable HRs.   Past Medical History:  Diagnosis Date   Cerebral palsy (HCC)    with spastic quadriparesis with numerous orthopedic procedures for contractures and fratures   Chronic constipation    Chronic pulmonary aspiration    CSA (central sleep apnea)    GERD (gastroesophageal reflux disease)    Hydrocephalus    s/p VP shunt   Hypoxemia    chronic   Iron deficiency anemia    OSA (obstructive sleep apnea)    Protein calorie malnutrition (HCC)    chronic   Tachycardia    chronic     Allergies  Allergen Reactions   Whole Blood    Cefazolin Rash   Midazolam Hcl Other (See Comments)    Un-reactive pupils after surgery   Septra [Bactrim] Hives   Vancomycin Rash     Current Outpatient Medications  Medication Sig Dispense Refill   amoxicillin (AMOXIL) 500 MG capsule Take 4 capsules by mouth as directed. Prior to dental procedures     BACLOFEN IT 1,449 mcg by Intrathecal route daily.     ketoconazole (NIZORAL) 2 % shampoo Apply 1 application  topically as needed.     Lacosamide (VIMPAT) 100 MG TABS Take 1 tablet by mouth 2 (two) times daily.     LINZESS 145 MCG CAPS Take 145 mcg by mouth daily.      nitrofurantoin, macrocrystal-monohydrate, (MACROBID) 100 MG capsule Take 1 capsule by mouth daily.     omeprazole (PRILOSEC) 20 MG capsule Take 20 mg by mouth 2 (two) times daily.     omeprazole (PRILOSEC) 40 MG capsule Take 1 capsule by mouth 2 (two) times daily.     polyethylene glycol (MIRALAX / GLYCOLAX) packet Take 8.5 g by mouth daily as needed.     ranitidine (ZANTAC) 150 MG capsule Take 150 mg by mouth every evening.     sodium chloride HYPERTONIC 3 % nebulizer solution Take by nebulization 2 (two) times daily.     sucralfate (CARAFATE) 1 GM/10ML suspension Take 500 mg by mouth 2 (two) times daily.     tamsulosin (FLOMAX) 0.4 MG CAPS capsule Take 1 mg by mouth daily.     tretinoin (RETIN-A) 0.025 % cream Apply a a thin layer to affected areas nightly 20 min after washing. Start with two nights weekly and increase as tolerated.     Urea (X-VIATE) 40 % LOTN APPLY 1-2 TIMES DAILY TO KERTOSIS PILARIS ON ARMS     VIMPAT 50 MG TABS tablet Take 2 tablets by mouth 2 (two) times daily.  0   No current facility-administered medications  for this visit.     Past Surgical History:  Procedure Laterality Date   BACK SURGERY     HARRINGTON RODS PLACED FOR SCOLIOSIS   BACLOFEN PUMP PLACEMENT     CSF SHUNT     EYE SURGERY     MULTIPLE   HIP SURGERY     MULTIPLE   INGUINAL HERNIA REPAIR     BILATERAL   NEPHRECTOMY     [facility]   VENTRICULAR ATRIAL SHUNT       Allergies  Allergen Reactions   Whole Blood    Cefazolin Rash   Midazolam Hcl Other (See Comments)    Un-reactive pupils after surgery   Septra [Bactrim] Hives   Vancomycin Rash      Family History  Problem Relation Age of Onset   Colon cancer Father    Hypertension Father    Osteoporosis Mother    Osteoarthritis Mother         in her back     Social History Mr. Bohlin reports that he has never smoked. He has never used smokeless tobacco. Mr. Bocock reports no history of alcohol use.   Review of Systems CONSTITUTIONAL: No weight loss, fever, chills, weakness or fatigue.  HEENT: Eyes: No visual loss, blurred vision, double vision or yellow sclerae.No hearing loss, sneezing, congestion, runny nose or sore throat.  SKIN: No rash or itching.  CARDIOVASCULAR: per hpi RESPIRATORY: No shortness of breath, cough or sputum.  GASTROINTESTINAL: No anorexia, nausea, vomiting or diarrhea. No abdominal pain or blood.  GENITOURINARY: No burning on urination, no polyuria NEUROLOGICAL: No headache, dizziness, syncope, paralysis, ataxia, numbness or tingling in the extremities. No change in bowel or bladder control.  MUSCULOSKELETAL: No muscle, back pain, joint pain or stiffness.  LYMPHATICS: No enlarged nodes. No history of splenectomy.  PSYCHIATRIC: No history of depression or anxiety.  ENDOCRINOLOGIC: No reports of sweating, cold or heat intolerance. No polyuria or polydipsia.  Marland Kitchen   Physical Examination Today's Vitals   09/06/19 1339  BP: (!) 146/80  Pulse: (!) 106  SpO2: 94%  Weight: 114 lb (51.7 kg)  Height: 5\' 4"  (1.626 m)   Body mass index is 19.57 kg/m.  Gen: resting comfortably, no acute distress HEENT: no scleral icterus, pupils equal round and reactive, no palptable cervical adenopathy,  CV: RRR, no mr/g/, no jvd Resp: Clear to auscultation bilaterally GI: abdomen is soft, non-tender, non-distended, normal bowel sounds, no hepatosplenomegaly MSK: extremities are warm, no edema.  Skin: warm, no rash Neuro:  no focal deficits Psych: appropriate affect   Diagnostic Studies 02/2012 Echo  LVEF 60-65%, grade I diastolic dysfunction, normal LA size,   05/2016 echo Study Conclusions   - Left ventricle: The cavity size was normal. Wall thickness was  normal. Systolic function was vigorous. The  estimated ejection  fraction was in the range of 65% to 70%. Wall motion was normal;  there were no regional wall motion abnormalities. Left  ventricular diastolic function parameters were normal.  - Mitral valve: There was trivial regurgitation.  - Right atrium: Central venous pressure (est): 3 mm Hg.  - Tricuspid valve: There was trivial regurgitation.  - Pulmonary arteries: Systolic pressure could not be accurately  estimated.  - Pericardium, extracardiac: A probable small pericardial effusion  with organization was identified circumferential to the heart.   Assessment and Plan  1. Tachycardia/PSVT - chronic, asymptomatic - since required a bp medication we will start diltiazem 30mg  bid  2. HTN - consistenyl elevated bp's -  we will start diltiazem 30mg  bid in setting of HTN, chronic sinus tachy and prior PSVT     , M.D

## 2019-09-06 NOTE — Patient Instructions (Addendum)
Medication Instructions:  Your physician has recommended you make the following change in your medication:   Start Diltiazem 30 mg Two Times Daily   Call office in 1 week with Heart rate and Blood pressure.   *If you need a refill on your cardiac medications before your next appointment, please call your pharmacy*   Lab Work: NONE   If you have labs (blood work) drawn today and your tests are completely normal, you will receive your results only by: Marland Kitchen MyChart Message (if you have MyChart) OR . A paper copy in the mail If you have any lab test that is abnormal or we need to change your treatment, we will call you to review the results.   Testing/Procedures: NONE    Follow-Up: At [facility], you and your health needs are our priority.  As part of our continuing mission to provide you with exceptional heart care, we have created designated Provider Care Teams.  These Care Teams include your primary Cardiologist (physician) and Advanced Practice Providers (APPs -  Physician Assistants and Nurse Practitioners) who all work together to provide you with the care you need, when you need it.  We recommend signing up for the patient portal called "MyChart".  Sign up information is provided on this After Visit Summary.  MyChart is used to connect with patients for Virtual Visits (Telemedicine).  Patients are able to view lab/test results, encounter notes, upcoming appointments, etc.  Non-urgent messages can be sent to your provider as well.   To learn more about what you can do with MyChart, go to [email or web address].    Your next appointment:   6 month(s)  The format for your next appointment:   In Person  Provider:   Dina Rich, MD   Other Instructions Thank you for choosing [facility]!

## 2019-09-18 DIAGNOSIS — R32 Unspecified urinary incontinence: Secondary | ICD-10-CM | POA: Diagnosis not present

## 2019-09-18 DIAGNOSIS — G809 Cerebral palsy, unspecified: Secondary | ICD-10-CM | POA: Diagnosis not present

## 2019-09-18 DIAGNOSIS — J45901 Unspecified asthma with (acute) exacerbation: Secondary | ICD-10-CM | POA: Diagnosis not present

## 2019-09-18 DIAGNOSIS — R1310 Dysphagia, unspecified: Secondary | ICD-10-CM | POA: Diagnosis not present

## 2019-09-19 DIAGNOSIS — Z4549 Encounter for adjustment and management of other implanted nervous system device: Secondary | ICD-10-CM | POA: Diagnosis not present

## 2019-09-19 DIAGNOSIS — G808 Other cerebral palsy: Secondary | ICD-10-CM | POA: Diagnosis not present

## 2019-10-18 DIAGNOSIS — J45901 Unspecified asthma with (acute) exacerbation: Secondary | ICD-10-CM | POA: Diagnosis not present

## 2019-10-18 DIAGNOSIS — R1310 Dysphagia, unspecified: Secondary | ICD-10-CM | POA: Diagnosis not present

## 2019-10-18 DIAGNOSIS — G809 Cerebral palsy, unspecified: Secondary | ICD-10-CM | POA: Diagnosis not present

## 2019-10-18 DIAGNOSIS — R32 Unspecified urinary incontinence: Secondary | ICD-10-CM | POA: Diagnosis not present

## 2019-10-22 DIAGNOSIS — Z23 Encounter for immunization: Secondary | ICD-10-CM | POA: Diagnosis not present

## 2019-10-22 DIAGNOSIS — G808 Other cerebral palsy: Secondary | ICD-10-CM | POA: Diagnosis not present

## 2019-11-06 DIAGNOSIS — Z4549 Encounter for adjustment and management of other implanted nervous system device: Secondary | ICD-10-CM | POA: Diagnosis not present

## 2019-11-06 DIAGNOSIS — G808 Other cerebral palsy: Secondary | ICD-10-CM | POA: Diagnosis not present

## 2019-11-21 DIAGNOSIS — Z905 Acquired absence of kidney: Secondary | ICD-10-CM | POA: Diagnosis not present

## 2019-11-21 DIAGNOSIS — R81 Glycosuria: Secondary | ICD-10-CM | POA: Diagnosis not present

## 2019-11-26 DIAGNOSIS — G809 Cerebral palsy, unspecified: Secondary | ICD-10-CM | POA: Diagnosis not present

## 2019-11-26 DIAGNOSIS — G4733 Obstructive sleep apnea (adult) (pediatric): Secondary | ICD-10-CM | POA: Diagnosis not present

## 2019-11-26 DIAGNOSIS — J301 Allergic rhinitis due to pollen: Secondary | ICD-10-CM | POA: Diagnosis not present

## 2019-12-04 DIAGNOSIS — H0288B Meibomian gland dysfunction left eye, upper and lower eyelids: Secondary | ICD-10-CM | POA: Diagnosis not present

## 2019-12-04 DIAGNOSIS — H0288A Meibomian gland dysfunction right eye, upper and lower eyelids: Secondary | ICD-10-CM | POA: Diagnosis not present

## 2019-12-10 DIAGNOSIS — G808 Other cerebral palsy: Secondary | ICD-10-CM | POA: Diagnosis not present

## 2019-12-10 DIAGNOSIS — Z4549 Encounter for adjustment and management of other implanted nervous system device: Secondary | ICD-10-CM | POA: Diagnosis not present

## 2019-12-17 DIAGNOSIS — G4731 Primary central sleep apnea: Secondary | ICD-10-CM | POA: Diagnosis not present

## 2019-12-18 DIAGNOSIS — J45901 Unspecified asthma with (acute) exacerbation: Secondary | ICD-10-CM | POA: Diagnosis not present

## 2019-12-18 DIAGNOSIS — R32 Unspecified urinary incontinence: Secondary | ICD-10-CM | POA: Diagnosis not present

## 2019-12-18 DIAGNOSIS — R1310 Dysphagia, unspecified: Secondary | ICD-10-CM | POA: Diagnosis not present

## 2019-12-18 DIAGNOSIS — G809 Cerebral palsy, unspecified: Secondary | ICD-10-CM | POA: Diagnosis not present

## 2020-01-18 DIAGNOSIS — G809 Cerebral palsy, unspecified: Secondary | ICD-10-CM | POA: Diagnosis not present

## 2020-01-18 DIAGNOSIS — J45901 Unspecified asthma with (acute) exacerbation: Secondary | ICD-10-CM | POA: Diagnosis not present

## 2020-01-18 DIAGNOSIS — R32 Unspecified urinary incontinence: Secondary | ICD-10-CM | POA: Diagnosis not present

## 2020-01-18 DIAGNOSIS — R1310 Dysphagia, unspecified: Secondary | ICD-10-CM | POA: Diagnosis not present

## 2020-01-21 DIAGNOSIS — N319 Neuromuscular dysfunction of bladder, unspecified: Secondary | ICD-10-CM | POA: Diagnosis not present

## 2020-01-21 DIAGNOSIS — G808 Other cerebral palsy: Secondary | ICD-10-CM | POA: Diagnosis not present

## 2020-01-21 DIAGNOSIS — G4731 Primary central sleep apnea: Secondary | ICD-10-CM | POA: Diagnosis not present

## 2020-01-21 DIAGNOSIS — G919 Hydrocephalus, unspecified: Secondary | ICD-10-CM | POA: Diagnosis not present

## 2020-01-21 DIAGNOSIS — Z87440 Personal history of urinary (tract) infections: Secondary | ICD-10-CM | POA: Diagnosis not present

## 2020-01-21 DIAGNOSIS — Z4549 Encounter for adjustment and management of other implanted nervous system device: Secondary | ICD-10-CM | POA: Diagnosis not present

## 2020-02-06 ENCOUNTER — Ambulatory Visit: Payer: Medicare PPO | Admitting: Student

## 2020-02-13 DIAGNOSIS — K219 Gastro-esophageal reflux disease without esophagitis: Secondary | ICD-10-CM | POA: Diagnosis not present

## 2020-02-13 DIAGNOSIS — R1312 Dysphagia, oropharyngeal phase: Secondary | ICD-10-CM | POA: Diagnosis not present

## 2020-02-13 DIAGNOSIS — G808 Other cerebral palsy: Secondary | ICD-10-CM | POA: Diagnosis not present

## 2020-02-13 DIAGNOSIS — K5901 Slow transit constipation: Secondary | ICD-10-CM | POA: Diagnosis not present

## 2020-02-18 DIAGNOSIS — R1310 Dysphagia, unspecified: Secondary | ICD-10-CM | POA: Diagnosis not present

## 2020-02-18 DIAGNOSIS — J45901 Unspecified asthma with (acute) exacerbation: Secondary | ICD-10-CM | POA: Diagnosis not present

## 2020-02-18 DIAGNOSIS — R32 Unspecified urinary incontinence: Secondary | ICD-10-CM | POA: Diagnosis not present

## 2020-02-18 DIAGNOSIS — G809 Cerebral palsy, unspecified: Secondary | ICD-10-CM | POA: Diagnosis not present

## 2020-02-25 DIAGNOSIS — G40219 Localization-related (focal) (partial) symptomatic epilepsy and epileptic syndromes with complex partial seizures, intractable, without status epilepticus: Secondary | ICD-10-CM | POA: Diagnosis not present

## 2020-02-25 DIAGNOSIS — G808 Other cerebral palsy: Secondary | ICD-10-CM | POA: Diagnosis not present

## 2020-02-25 DIAGNOSIS — M62838 Other muscle spasm: Secondary | ICD-10-CM | POA: Diagnosis not present

## 2020-02-25 DIAGNOSIS — Z79899 Other long term (current) drug therapy: Secondary | ICD-10-CM | POA: Diagnosis not present

## 2020-02-25 DIAGNOSIS — G800 Spastic quadriplegic cerebral palsy: Secondary | ICD-10-CM | POA: Diagnosis not present

## 2020-02-25 DIAGNOSIS — R5383 Other fatigue: Secondary | ICD-10-CM | POA: Diagnosis not present

## 2020-03-05 DIAGNOSIS — G808 Other cerebral palsy: Secondary | ICD-10-CM | POA: Diagnosis not present

## 2020-03-05 DIAGNOSIS — Z79899 Other long term (current) drug therapy: Secondary | ICD-10-CM | POA: Diagnosis not present

## 2020-03-05 DIAGNOSIS — Z4549 Encounter for adjustment and management of other implanted nervous system device: Secondary | ICD-10-CM | POA: Diagnosis not present

## 2020-03-05 DIAGNOSIS — R252 Cramp and spasm: Secondary | ICD-10-CM | POA: Diagnosis not present

## 2020-03-05 DIAGNOSIS — Z87440 Personal history of urinary (tract) infections: Secondary | ICD-10-CM | POA: Diagnosis not present

## 2020-03-05 DIAGNOSIS — G919 Hydrocephalus, unspecified: Secondary | ICD-10-CM | POA: Diagnosis not present

## 2020-03-05 DIAGNOSIS — K219 Gastro-esophageal reflux disease without esophagitis: Secondary | ICD-10-CM | POA: Diagnosis not present

## 2020-03-05 DIAGNOSIS — G4731 Primary central sleep apnea: Secondary | ICD-10-CM | POA: Diagnosis not present

## 2020-03-05 DIAGNOSIS — N319 Neuromuscular dysfunction of bladder, unspecified: Secondary | ICD-10-CM | POA: Diagnosis not present

## 2020-03-05 DIAGNOSIS — K5900 Constipation, unspecified: Secondary | ICD-10-CM | POA: Diagnosis not present

## 2020-03-13 ENCOUNTER — Ambulatory Visit (INDEPENDENT_AMBULATORY_CARE_PROVIDER_SITE_OTHER): Payer: Medicare PPO | Admitting: Student

## 2020-03-13 ENCOUNTER — Other Ambulatory Visit: Payer: Self-pay

## 2020-03-13 ENCOUNTER — Encounter: Payer: Self-pay | Admitting: Student

## 2020-03-13 VITALS — BP 132/82 | HR 100 | Ht 66.0 in | Wt 118.0 lb

## 2020-03-13 DIAGNOSIS — I10 Essential (primary) hypertension: Secondary | ICD-10-CM

## 2020-03-13 DIAGNOSIS — R000 Tachycardia, unspecified: Secondary | ICD-10-CM

## 2020-03-13 NOTE — Progress Notes (Signed)
Cardiology Office Note    Date:  03/13/2020   ID:  Joshua Logan, DOB 11-01-1980, MRN [id number]  PCP:  Estanislado Pandy, MD  Cardiologist: Dina Rich, MD    Chief Complaint  Patient presents with  . Follow-up    6 month visit    History of Present Illness:    Joshua Logan is a 40 y.o. male with past medical history of SVT, HTN, hydrocephalus, central sleep apnea and cerebral palsy who presents to the office today for 95-month follow-up.  He was last examined by Dr. Wyline Mood in 08/2019 and had previously been on Metoprolol for his SVT but the patient reported the medication increased the frequency of his episodes. He had previously been found to have seizure activity in the setting of tachycardia and it was felt that the tachycardia might have been provoked by his seizures.  Given his elevated BP and chronic tachycardia, he was started on short acting Cardizem 30 mg twice daily.  In talking with the patient today, most history is provided by his mom as he is nonverbal but will nod his head yes and no to questions. He does report having palpitations but upon further interview it appears this typically occurs in the mornings while watching the local news. He is unable to elaborate on how long they last. He denies any symptoms later in the day or during the night. His mom reports they have been checking his blood pressure regularly and it has been well controlled at home and during recent office visits. No reported orthopnea, PND or lower extremity edema. He is wheel-chair/bed-bound at baseline.   Past Medical History:  Diagnosis Date  . Cerebral palsy (HCC)    with spastic quadriparesis with numerous orthopedic procedures for contractures and fratures  . Chronic constipation   . Chronic pulmonary aspiration   . CSA (central sleep apnea)   . GERD (gastroesophageal reflux disease)   . Hydrocephalus (HCC)    s/p VP shunt  . Hypoxemia    chronic  . Iron deficiency anemia   . OSA  (obstructive sleep apnea)   . Protein calorie malnutrition (HCC)    chronic  . Tachycardia    chronic    Past Surgical History:  Procedure Laterality Date  . BACK SURGERY     HARR[place] RODS PLACED FOR SCOLIOSIS  . BACLOFEN PUMP PLACEMENT    . CSF SHUNT    . EYE SURGERY     MULTIPLE  . HIP SURGERY     MULTIPLE  . INGUINAL HERNIA REPAIR     BILATERAL  . NEPHRECTOMY     [facility]  . VENTRICULAR ATRIAL SHUNT      Current Medications: Outpatient Medications Prior to Visit  Medication Sig Dispense Refill  . amoxicillin (AMOXIL) 500 MG capsule Take 4 capsules by mouth as directed. Prior to dental procedures    . BACLOFEN IT 1,449 mcg by Intrathecal route daily.    . cephALEXin (KEFLEX) 250 MG capsule     . diltiazem (CARDIZEM) 30 MG tablet Take 1 tablet (30 mg total) by mouth 2 (two) times daily. 60 tablet 11  . ketoconazole (NIZORAL) 2 % shampoo Apply 1 application topically as needed.    . Lacosamide (VIMPAT) 150 MG TABS Take 1 tablet by mouth every morning.    . lacosamide (VIMPAT) 200 MG TABS tablet Take 200 mg by mouth at bedtime.    . Lacosamide 150 MG TABS Take 1 tablet by mouth daily.    Marland Kitchen  LINZESS 145 MCG CAPS Take 145 mcg by mouth daily.     . nitrofurantoin, macrocrystal-monohydrate, (MACROBID) 100 MG capsule Take 1 capsule by mouth daily.    Marland Kitchen omeprazole (PRILOSEC) 40 MG capsule Take 1 capsule by mouth 2 (two) times daily.    . polyethylene glycol (MIRALAX / GLYCOLAX) packet Take 8.5 g by mouth daily as needed.    . sodium chloride HYPERTONIC 3 % nebulizer solution Take by nebulization 2 (two) times daily.    . sucralfate (CARAFATE) 1 GM/10ML suspension Take 500 mg by mouth 2 (two) times daily.    . tamsulosin (FLOMAX) 0.4 MG CAPS capsule Take 1 mg by mouth daily.    Marland Kitchen tretinoin (RETIN-A) 0.025 % cream Apply a a thin layer to affected areas nightly 20 min after washing. Start with two nights weekly and increase as tolerated.    . Urea 40 % LOTN APPLY 1-2 TIMES  DAILY TO KERTOSIS PILARIS ON ARMS     No facility-administered medications prior to visit.     Allergies:   Amoxicillin-pot clavulanate, Sulfamethoxazole-trimethoprim, Doxycycline, Whole blood, Cefazolin, Midazolam hcl, Septra [bactrim], and Vancomycin   Social History   Socioeconomic History  . Marital status: Single    Spouse name: Not on file  . Number of children: Not on file  . Years of education: Not on file  . Highest education level: Not on file  Occupational History  . Not on file  Tobacco Use  . Smoking status: Never Smoker  . Smokeless tobacco: Never Used  Substance and Sexual Activity  . Alcohol use: No    Alcohol/week: 0.0 standard drinks  . Drug use: No  . Sexual activity: Not on file  Other Topics Concern  . Not on file  Social History Narrative   Lives with mother and father in [place] area   [employer] graduate   Patient is nonverbal and uses Dyn[employer] speech communication device for communication   Social Determinants of Health   Financial Resource Strain: Not on file  Food Insecurity: Not on file  Transportation Needs: Not on file  Physical Activity: Not on file  Stress: Not on file  Social Connections: Not on file     Family History:  The patient's family history includes Colon cancer in his father; Hypertension in his father; Osteoarthritis in his mother; Osteoporosis in his mother.   Review of Systems:   Please see the history of present illness.     General:  No chills, fever, night sweats or weight changes.  Cardiovascular:  No chest pain, dyspnea on exertion, edema, orthopnea, paroxysmal nocturnal dyspnea. Positive for palpitations.  Dermatological: No rash, lesions/masses Respiratory: No cough, dyspnea Urologic: No hematuria, dysuria Abdominal:   No nausea, vomiting, diarrhea, bright red blood per rectum, melena, or hematemesis Neurologic:  No visual changes, wkns, changes in mental status. All other systems reviewed and are otherwise  negative except as noted above.   Physical Exam:    VS:  BP 132/82   Pulse 100   Ht 5\' 6"  (1.676 m)   Wt 118 lb (53.5 kg) Comment: Per family  SpO2 95%   BMI 19.05 kg/m    General: Well developed, thin male appearing in no acute distress. Sitting in wheelchair. Head: Normocephalic, atraumatic. Neck: No carotid bruits. JVD not elevated.  Lungs: Respirations regular and unlabored, without wheezes or rales.  Heart: Regular rate and rhythm. No S3 or S4.  No murmur, no rubs, or gallops appreciated. Abdomen: Appears non-distended. No obvious abdominal  masses. Msk:  Strength and tone appear normal for age. No obvious joint deformities or effusions. Extremities: No clubbing or cyanosis. No lower extremity edema.  Distal pedal pulses are 2+ bilaterally. Neuro: Alert and oriented X 3. Contractures noted. Psych:  Nonverbal. Will nod head yes and no.  Skin: No rashes or lesions noted  Wt Readings from Last 3 Encounters:  03/13/20 118 lb (53.5 kg)  09/06/19 114 lb (51.7 kg)  01/21/15 110 lb (49.9 kg)     Studies/Labs Reviewed:   EKG:  EKG is not ordered today.    Recent Labs: No results found for requested labs within last 8760 hours.   Lipid Panel No results found for: CHOL, TRIG, HDL, CHOLHDL, VLDL, LDLCALC, LDLDIRECT  Additional studies/ records that were reviewed today include:   Echocardiogram: 05/2016 Study Conclusions   - Left ventricle: The cavity size was normal. Wall thickness was  normal. Systolic function was vigorous. The estimated ejection  fraction was in the range of 65% to 70%. Wall motion was normal;  there were no regional wall motion abnormalities. Left  ventricular diastolic function parameters were normal.  - Mitral valve: There was trivial regurgitation.  - Right atrium: Central venous pressure (est): 3 mm Hg.  - Tricuspid valve: There was trivial regurgitation.  - Pulmonary arteries: Systolic pressure could not be accurately  estimated.  -  Pericardium, extracardiac: A probable small pericardial effusion  with organization was identified circumferential to the heart.   Impressions:   - Normal LV wall thickness with LVEF 65-70% and normal diastolic  function. Trivial mitral and tricuspid regurgitation. Probable  small circumferential pericardial effusion with organization  noted.   Assessment:    1. Tachycardia   2. Essential hypertension      Plan:   In order of problems listed above:  1. SVT/Sinus Tachycardia - It is difficult to discern from his history how frequently the episodes occur and for how long they last. The patient's mother is going to monitor his heart rate with a pulse oximeter at home and report back with readings. At this time, will continue short-acting Cardizem 30 mg twice daily. If he continues to have palpitations or heart rate is greater than 110 frequently, would plan to further titrate Cardizem to 60 mg twice daily and this was reviewed with them today.  2. HTN - BP is well controlled at 132/82 during today's visit and they report this has been well controlled during his office visits at [facility] as well. Continue short-acting Cardizem 30 mg twice daily for now.    Medication Adjustments/Labs and Tests Ordered: Current medicines are reviewed at length with the patient today.  Concerns regarding medicines are outlined above.  Medication changes, Labs and Tests ordered today are listed in the Patient Instructions below. Patient Instructions  Medication Instructions:   Call back if needing to increase Diltiazem due to palpitations.   Labwork:  None.   Testing/Procedures:  None.   Follow-Up:  With Dr. Wyline Mood or Randall An, PA-C in 6 months.   Any Other Special Instructions Will Be Listed Below (If Applicable).   If you need a refill on your cardiac medications before your next appointment, please call your pharmacy.    Signed, Ellsworth Lennox, PA-C  03/13/2020 5:21  PM    [facility] [address] [place], [place] [address] Phone: [phone] Fax: [phone]

## 2020-03-13 NOTE — Patient Instructions (Signed)
Medication Instructions:   Call back if needing to increase Diltiazem due to palpitations.   Labwork:  None.   Testing/Procedures:  None.   Follow-Up:  With Dr. Wyline Mood or Randall An, PA-C in 6 months.   Any Other Special Instructions Will Be Listed Below (If Applicable).     If you need a refill on your cardiac medications before your next appointment, please call your pharmacy.

## 2020-03-17 DIAGNOSIS — G809 Cerebral palsy, unspecified: Secondary | ICD-10-CM | POA: Diagnosis not present

## 2020-03-17 DIAGNOSIS — R1310 Dysphagia, unspecified: Secondary | ICD-10-CM | POA: Diagnosis not present

## 2020-03-17 DIAGNOSIS — R32 Unspecified urinary incontinence: Secondary | ICD-10-CM | POA: Diagnosis not present

## 2020-03-17 DIAGNOSIS — J45901 Unspecified asthma with (acute) exacerbation: Secondary | ICD-10-CM | POA: Diagnosis not present

## 2020-04-17 DIAGNOSIS — G809 Cerebral palsy, unspecified: Secondary | ICD-10-CM | POA: Diagnosis not present

## 2020-04-17 DIAGNOSIS — R32 Unspecified urinary incontinence: Secondary | ICD-10-CM | POA: Diagnosis not present

## 2020-04-17 DIAGNOSIS — J45901 Unspecified asthma with (acute) exacerbation: Secondary | ICD-10-CM | POA: Diagnosis not present

## 2020-04-17 DIAGNOSIS — R1310 Dysphagia, unspecified: Secondary | ICD-10-CM | POA: Diagnosis not present

## 2020-04-22 DIAGNOSIS — G40219 Localization-related (focal) (partial) symptomatic epilepsy and epileptic syndromes with complex partial seizures, intractable, without status epilepticus: Secondary | ICD-10-CM | POA: Diagnosis not present

## 2020-04-22 DIAGNOSIS — G4731 Primary central sleep apnea: Secondary | ICD-10-CM | POA: Diagnosis not present

## 2020-04-22 DIAGNOSIS — R252 Cramp and spasm: Secondary | ICD-10-CM | POA: Diagnosis not present

## 2020-04-22 DIAGNOSIS — Z4549 Encounter for adjustment and management of other implanted nervous system device: Secondary | ICD-10-CM | POA: Diagnosis not present

## 2020-04-22 DIAGNOSIS — Z87440 Personal history of urinary (tract) infections: Secondary | ICD-10-CM | POA: Diagnosis not present

## 2020-04-22 DIAGNOSIS — K5900 Constipation, unspecified: Secondary | ICD-10-CM | POA: Diagnosis not present

## 2020-04-22 DIAGNOSIS — Z79899 Other long term (current) drug therapy: Secondary | ICD-10-CM | POA: Diagnosis not present

## 2020-04-22 DIAGNOSIS — K219 Gastro-esophageal reflux disease without esophagitis: Secondary | ICD-10-CM | POA: Diagnosis not present

## 2020-04-22 DIAGNOSIS — N319 Neuromuscular dysfunction of bladder, unspecified: Secondary | ICD-10-CM | POA: Diagnosis not present

## 2020-05-04 DIAGNOSIS — G4731 Primary central sleep apnea: Secondary | ICD-10-CM | POA: Diagnosis not present

## 2020-05-06 DIAGNOSIS — G4733 Obstructive sleep apnea (adult) (pediatric): Secondary | ICD-10-CM | POA: Diagnosis not present

## 2020-05-06 DIAGNOSIS — J301 Allergic rhinitis due to pollen: Secondary | ICD-10-CM | POA: Diagnosis not present

## 2020-05-06 DIAGNOSIS — G809 Cerebral palsy, unspecified: Secondary | ICD-10-CM | POA: Diagnosis not present

## 2020-05-17 DIAGNOSIS — R32 Unspecified urinary incontinence: Secondary | ICD-10-CM | POA: Diagnosis not present

## 2020-05-17 DIAGNOSIS — J45901 Unspecified asthma with (acute) exacerbation: Secondary | ICD-10-CM | POA: Diagnosis not present

## 2020-05-17 DIAGNOSIS — G809 Cerebral palsy, unspecified: Secondary | ICD-10-CM | POA: Diagnosis not present

## 2020-05-17 DIAGNOSIS — R1310 Dysphagia, unspecified: Secondary | ICD-10-CM | POA: Diagnosis not present

## 2020-05-27 DIAGNOSIS — L219 Seborrheic dermatitis, unspecified: Secondary | ICD-10-CM | POA: Diagnosis not present

## 2020-05-27 DIAGNOSIS — L739 Follicular disorder, unspecified: Secondary | ICD-10-CM | POA: Diagnosis not present

## 2020-05-27 DIAGNOSIS — R209 Unspecified disturbances of skin sensation: Secondary | ICD-10-CM | POA: Diagnosis not present

## 2020-05-27 DIAGNOSIS — D485 Neoplasm of uncertain behavior of skin: Secondary | ICD-10-CM | POA: Diagnosis not present

## 2020-05-27 DIAGNOSIS — L858 Other specified epidermal thickening: Secondary | ICD-10-CM | POA: Diagnosis not present

## 2020-05-28 DIAGNOSIS — Z23 Encounter for immunization: Secondary | ICD-10-CM | POA: Diagnosis not present

## 2020-05-31 ENCOUNTER — Other Ambulatory Visit: Payer: Self-pay | Admitting: Cardiology

## 2020-06-03 DIAGNOSIS — N319 Neuromuscular dysfunction of bladder, unspecified: Secondary | ICD-10-CM | POA: Diagnosis not present

## 2020-06-03 DIAGNOSIS — G40909 Epilepsy, unspecified, not intractable, without status epilepticus: Secondary | ICD-10-CM | POA: Diagnosis not present

## 2020-06-03 DIAGNOSIS — K219 Gastro-esophageal reflux disease without esophagitis: Secondary | ICD-10-CM | POA: Diagnosis not present

## 2020-06-03 DIAGNOSIS — Z982 Presence of cerebrospinal fluid drainage device: Secondary | ICD-10-CM | POA: Diagnosis not present

## 2020-06-03 DIAGNOSIS — Z4549 Encounter for adjustment and management of other implanted nervous system device: Secondary | ICD-10-CM | POA: Diagnosis not present

## 2020-06-03 DIAGNOSIS — G4731 Primary central sleep apnea: Secondary | ICD-10-CM | POA: Diagnosis not present

## 2020-06-03 DIAGNOSIS — G919 Hydrocephalus, unspecified: Secondary | ICD-10-CM | POA: Diagnosis not present

## 2020-06-03 DIAGNOSIS — R252 Cramp and spasm: Secondary | ICD-10-CM | POA: Diagnosis not present

## 2020-06-03 DIAGNOSIS — K5900 Constipation, unspecified: Secondary | ICD-10-CM | POA: Diagnosis not present

## 2020-06-17 DIAGNOSIS — J45901 Unspecified asthma with (acute) exacerbation: Secondary | ICD-10-CM | POA: Diagnosis not present

## 2020-06-17 DIAGNOSIS — G4731 Primary central sleep apnea: Secondary | ICD-10-CM | POA: Diagnosis not present

## 2020-06-17 DIAGNOSIS — R1310 Dysphagia, unspecified: Secondary | ICD-10-CM | POA: Diagnosis not present

## 2020-06-17 DIAGNOSIS — G809 Cerebral palsy, unspecified: Secondary | ICD-10-CM | POA: Diagnosis not present

## 2020-06-17 DIAGNOSIS — R32 Unspecified urinary incontinence: Secondary | ICD-10-CM | POA: Diagnosis not present

## 2020-06-23 DIAGNOSIS — G808 Other cerebral palsy: Secondary | ICD-10-CM | POA: Diagnosis not present

## 2020-07-17 DIAGNOSIS — R1310 Dysphagia, unspecified: Secondary | ICD-10-CM | POA: Diagnosis not present

## 2020-07-17 DIAGNOSIS — J45901 Unspecified asthma with (acute) exacerbation: Secondary | ICD-10-CM | POA: Diagnosis not present

## 2020-07-17 DIAGNOSIS — R32 Unspecified urinary incontinence: Secondary | ICD-10-CM | POA: Diagnosis not present

## 2020-07-17 DIAGNOSIS — G809 Cerebral palsy, unspecified: Secondary | ICD-10-CM | POA: Diagnosis not present

## 2020-07-22 DIAGNOSIS — Z4549 Encounter for adjustment and management of other implanted nervous system device: Secondary | ICD-10-CM | POA: Diagnosis not present

## 2020-07-22 DIAGNOSIS — G800 Spastic quadriplegic cerebral palsy: Secondary | ICD-10-CM | POA: Diagnosis not present

## 2020-07-22 DIAGNOSIS — K219 Gastro-esophageal reflux disease without esophagitis: Secondary | ICD-10-CM | POA: Diagnosis not present

## 2020-07-22 DIAGNOSIS — N319 Neuromuscular dysfunction of bladder, unspecified: Secondary | ICD-10-CM | POA: Diagnosis not present

## 2020-07-22 DIAGNOSIS — Z79899 Other long term (current) drug therapy: Secondary | ICD-10-CM | POA: Diagnosis not present

## 2020-07-22 DIAGNOSIS — Z462 Encounter for fitting and adjustment of other devices related to nervous system and special senses: Secondary | ICD-10-CM | POA: Diagnosis not present

## 2020-07-22 DIAGNOSIS — K5900 Constipation, unspecified: Secondary | ICD-10-CM | POA: Diagnosis not present

## 2020-07-22 DIAGNOSIS — Z87440 Personal history of urinary (tract) infections: Secondary | ICD-10-CM | POA: Diagnosis not present

## 2020-07-22 DIAGNOSIS — R252 Cramp and spasm: Secondary | ICD-10-CM | POA: Diagnosis not present

## 2020-07-22 DIAGNOSIS — G4731 Primary central sleep apnea: Secondary | ICD-10-CM | POA: Diagnosis not present

## 2020-07-28 DIAGNOSIS — Z09 Encounter for follow-up examination after completed treatment for conditions other than malignant neoplasm: Secondary | ICD-10-CM | POA: Diagnosis not present

## 2020-07-28 DIAGNOSIS — H5501 Congenital nystagmus: Secondary | ICD-10-CM | POA: Diagnosis not present

## 2020-07-28 DIAGNOSIS — H47293 Other optic atrophy, bilateral: Secondary | ICD-10-CM | POA: Diagnosis not present

## 2020-07-28 DIAGNOSIS — H0100A Unspecified blepharitis right eye, upper and lower eyelids: Secondary | ICD-10-CM | POA: Diagnosis not present

## 2020-07-28 DIAGNOSIS — H47033 Optic nerve hypoplasia, bilateral: Secondary | ICD-10-CM | POA: Diagnosis not present

## 2020-07-28 DIAGNOSIS — H5005 Alternating esotropia: Secondary | ICD-10-CM | POA: Diagnosis not present

## 2020-08-03 DIAGNOSIS — G4731 Primary central sleep apnea: Secondary | ICD-10-CM | POA: Diagnosis not present

## 2020-08-11 DIAGNOSIS — G4731 Primary central sleep apnea: Secondary | ICD-10-CM | POA: Diagnosis not present

## 2020-08-17 DIAGNOSIS — J45901 Unspecified asthma with (acute) exacerbation: Secondary | ICD-10-CM | POA: Diagnosis not present

## 2020-08-17 DIAGNOSIS — G809 Cerebral palsy, unspecified: Secondary | ICD-10-CM | POA: Diagnosis not present

## 2020-08-17 DIAGNOSIS — R32 Unspecified urinary incontinence: Secondary | ICD-10-CM | POA: Diagnosis not present

## 2020-08-17 DIAGNOSIS — R1310 Dysphagia, unspecified: Secondary | ICD-10-CM | POA: Diagnosis not present

## 2020-08-19 DIAGNOSIS — N319 Neuromuscular dysfunction of bladder, unspecified: Secondary | ICD-10-CM | POA: Diagnosis not present

## 2020-08-19 DIAGNOSIS — Z87440 Personal history of urinary (tract) infections: Secondary | ICD-10-CM | POA: Diagnosis not present

## 2020-08-19 DIAGNOSIS — Z79899 Other long term (current) drug therapy: Secondary | ICD-10-CM | POA: Diagnosis not present

## 2020-08-19 DIAGNOSIS — G808 Other cerebral palsy: Secondary | ICD-10-CM | POA: Diagnosis not present

## 2020-08-19 DIAGNOSIS — Z87448 Personal history of other diseases of urinary system: Secondary | ICD-10-CM | POA: Diagnosis not present

## 2020-08-31 ENCOUNTER — Other Ambulatory Visit: Payer: Self-pay | Admitting: Cardiology

## 2020-09-08 DIAGNOSIS — Z4542 Encounter for adjustment and management of neuropacemaker (brain) (peripheral nerve) (spinal cord): Secondary | ICD-10-CM | POA: Diagnosis not present

## 2020-09-08 DIAGNOSIS — R252 Cramp and spasm: Secondary | ICD-10-CM | POA: Diagnosis not present

## 2020-09-10 ENCOUNTER — Ambulatory Visit (INDEPENDENT_AMBULATORY_CARE_PROVIDER_SITE_OTHER): Payer: Medicare PPO | Admitting: Cardiology

## 2020-09-10 ENCOUNTER — Other Ambulatory Visit: Payer: Self-pay

## 2020-09-10 ENCOUNTER — Encounter: Payer: Self-pay | Admitting: Cardiology

## 2020-09-10 VITALS — BP 148/98 | HR 102 | Ht 66.0 in | Wt 118.0 lb

## 2020-09-10 DIAGNOSIS — R000 Tachycardia, unspecified: Secondary | ICD-10-CM | POA: Diagnosis not present

## 2020-09-10 DIAGNOSIS — I10 Essential (primary) hypertension: Secondary | ICD-10-CM

## 2020-09-10 MED ORDER — DILTIAZEM HCL ER 60 MG PO CP12: 60.0000 mg | ORAL_CAPSULE | Freq: Two times a day (BID) | ORAL | 3 refills | Status: DC

## 2020-09-10 NOTE — Progress Notes (Signed)
Clinical Summary Joshua Logan is a 40 y.o.male seen today for follow up of the following medical problems.    1. Paroxysmal supraventricular tachycardia/Chronic tachycardia - discovered initially during workup for abnormal "spells" the patient was having per notes with cardiac monitoring. - prior echo shows  normal LVEF with no other significant abnormalities - tried on metoprolol however per report seemed to have increased frequency of episodes.   - he was later found to be having seizure activity during these spells by [facility], with improved episodes with alterations of his seizure therapy. The tachycardia may have been provoked by this seizure activity.    -occasional high heart rates at times at doctors visits -family reports he does mention some palpitations at times - has been on diltiazem 30mg  bid        2. Cerebal palsy - followed at [facility] neuro      3. HTN - often higher bp's at doctors office - home bp's typically  SBPs 130s-140s  Past Medical History:  Diagnosis Date   Cerebral palsy (HCC)    with spastic quadriparesis with numerous orthopedic procedures for contractures and fratures   Chronic constipation    Chronic pulmonary aspiration    CSA (central sleep apnea)    GERD (gastroesophageal reflux disease)    Hydrocephalus (HCC)    s/p VP shunt   Hypoxemia    chronic   Iron deficiency anemia    OSA (obstructive sleep apnea)    Protein calorie malnutrition (HCC)    chronic   Tachycardia    chronic     Allergies  Allergen Reactions   Amoxicillin-Pot Clavulanate Other (See Comments) and Rash    blisters Blisters. Pt can take Amoxicillin, but NOT Augmentin    Sulfamethoxazole-Trimethoprim Rash   Doxycycline Other (See Comments)    Other reaction(s): Other (See Comments) Bad reflux Bad reflux    Whole Blood    Cefazolin Rash   Midazolam Hcl Other (See Comments)    Un-reactive pupils after surgery   Septra [Bactrim] Hives   Vancomycin Rash      Current Outpatient Medications  Medication Sig Dispense Refill   amoxicillin (AMOXIL) 500 MG capsule Take 4 capsules by mouth as directed. Prior to dental procedures     BACLOFEN IT 1,449 mcg by Intrathecal route daily.     cephALEXin (KEFLEX) 250 MG capsule      diltiazem (CARDIZEM) 30 MG tablet TAKE 1 TABLET(30 MG) BY MOUTH TWICE DAILY 60 tablet 11   ketoconazole (NIZORAL) 2 % shampoo Apply 1 application topically as needed.     Lacosamide (VIMPAT) 150 MG TABS Take 1 tablet by mouth every morning.     lacosamide (VIMPAT) 200 MG TABS tablet Take 200 mg by mouth at bedtime.     Lacosamide 150 MG TABS Take 1 tablet by mouth daily.     LINZESS 145 MCG CAPS Take 145 mcg by mouth daily.      nitrofurantoin, macrocrystal-monohydrate, (MACROBID) 100 MG capsule Take 1 capsule by mouth daily.     omeprazole (PRILOSEC) 40 MG capsule Take 1 capsule by mouth 2 (two) times daily.     polyethylene glycol (MIRALAX / GLYCOLAX) packet Take 8.5 g by mouth daily as needed.     sodium chloride HYPERTONIC 3 % nebulizer solution Take by nebulization 2 (two) times daily.     sucralfate (CARAFATE) 1 GM/10ML suspension Take 500 mg by mouth 2 (two) times daily.     tamsulosin (FLOMAX) 0.4 MG  CAPS capsule Take 1 mg by mouth daily.     tretinoin (RETIN-A) 0.025 % cream Apply a a thin layer to affected areas nightly 20 min after washing. Start with two nights weekly and increase as tolerated.     Urea 40 % LOTN APPLY 1-2 TIMES DAILY TO KERTOSIS PILARIS ON ARMS     No current facility-administered medications for this visit.     Past Surgical History:  Procedure Laterality Date   BACK SURGERY     [place] RODS PLACED FOR SCOLIOSIS   BACLOFEN PUMP PLACEMENT     CSF SHUNT     EYE SURGERY     MULTIPLE   HIP SURGERY     MULTIPLE   INGUINAL HERNIA REPAIR     BILATERAL   NEPHRECTOMY     [facility]   VENTRICULAR ATRIAL SHUNT       Allergies  Allergen Reactions   Amoxicillin-Pot Clavulanate Other  (See Comments) and Rash    blisters Blisters. Pt can take Amoxicillin, but NOT Augmentin    Sulfamethoxazole-Trimethoprim Rash   Doxycycline Other (See Comments)    Other reaction(s): Other (See Comments) Bad reflux Bad reflux    Whole Blood    Cefazolin Rash   Midazolam Hcl Other (See Comments)    Un-reactive pupils after surgery   Septra [Bactrim] Hives   Vancomycin Rash      Family History  Problem Relation Age of Onset   Colon cancer Father    Hypertension Father    Osteoporosis Mother    Osteoarthritis Mother        in her back     Social History Mr. Hallahan reports that he has never smoked. He has never used smokeless tobacco. Mr. Gosselin reports no history of alcohol use.   Review of Systems CONSTITUTIONAL: No weight loss, fever, chills, weakness or fatigue.  HEENT: Eyes: No visual loss, blurred vision, double vision or yellow sclerae.No hearing loss, sneezing, congestion, runny nose or sore throat.  SKIN: No rash or itching.  CARDIOVASCULAR: per hpi RESPIRATORY: No shortness of breath, cough or sputum.  GASTROINTESTINAL: No anorexia, nausea, vomiting or diarrhea. No abdominal pain or blood.  GENITOURINARY: No burning on urination, no polyuria NEUROLOGICAL: No headache, dizziness, syncope, paralysis, ataxia, numbness or tingling in the extremities. No change in bowel or bladder control.  MUSCULOSKELETAL: No muscle, back pain, joint pain or stiffness.  LYMPHATICS: No enlarged nodes. No history of splenectomy.  PSYCHIATRIC: No history of depression or anxiety.  ENDOCRINOLOGIC: No reports of sweating, cold or heat intolerance. No polyuria or polydipsia.  Marland Kitchen   Physical Examination Today's Vitals   09/10/20 1352  BP: (!) 148/98  Pulse: (!) 102  SpO2: 95%  Weight: 118 lb (53.5 kg)  Height: 5\' 6"  (1.676 m)   Body mass index is 19.05 kg/m.  Gen: resting comfortably, no acute distress HEENT: no scleral icterus, pupils equal round and reactive, no palptable  cervical adenopathy,  CV: RRR, no m/r/g no jvd Resp: Clear to auscultation bilaterally GI: abdomen is soft, non-tender, non-distended, normal bowel sounds, no hepatosplenomegaly MSK: extremities are warm, no edema.  Skin: warm, no rash Neuro:  no focal deficits Psych: appropriate affect   Diagnostic Studies  02/2012 Echo  LVEF 60-65%, grade I diastolic dysfunction, normal LA size,    05/2016 echo Study Conclusions   - Left ventricle: The cavity size was normal. Wall thickness was    normal. Systolic function was vigorous. The estimated ejection    fraction was  in the range of 65% to 70%. Wall motion was normal;    there were no regional wall motion abnormalities. Left    ventricular diastolic function parameters were normal.  - Mitral valve: There was trivial regurgitation.  - Right atrium: Central venous pressure (est): 3 mm Hg.  - Tricuspid valve: There was trivial regurgitation.  - Pulmonary arteries: Systolic pressure could not be accurately    estimated.  - Pericardium, extracardiac: A probable small pericardial effusion    with organization was identified circumferential to the heart.   Assessment and Plan  1. Tachycardia/PSVT - some recent palitations, increase ditliazem to 60mg  bid   2. HTN - home numbers above goal - increase diltiazem to 60mg  bid. Dilt used for his HTN in setting of coexisting tachcyardia and palpitations.     F/u 6 months  , M.D.

## 2020-09-10 NOTE — Addendum Note (Signed)
Addended by: Kerney Elbe on: 09/10/2020 04:13 PM   Modules accepted: Orders

## 2020-09-10 NOTE — Patient Instructions (Signed)
Medication Instructions:  Your physician has recommended you make the following change in your medication:  INCREASE Diltiazem to 60 mg tablets twice daily  *If you need a refill on your cardiac medications before your next appointment, please call your pharmacy*   Lab Work: None If you have labs (blood work) drawn today and your tests are completely normal, you will receive your results only by: MyChart Message (if you have MyChart) OR A paper copy in the mail If you have any lab test that is abnormal or we need to change your treatment, we will call you to review the results.   Testing/Procedures: None   Follow-Up: At [facility], you and your health needs are our priority.  As part of our continuing mission to provide you with exceptional heart care, we have created designated Provider Care Teams.  These Care Teams include your primary Cardiologist (physician) and Advanced Practice Providers (APPs -  Physician Assistants and Nurse Practitioners) who all work together to provide you with the care you need, when you need it.  We recommend signing up for the patient portal called "MyChart".  Sign up information is provided on this After Visit Summary.  MyChart is used to connect with patients for Virtual Visits (Telemedicine).  Patients are able to view lab/test results, encounter notes, upcoming appointments, etc.  Non-urgent messages can be sent to your provider as well.   To learn more about what you can do with MyChart, go to [email or web address].    Your next appointment:   6 month(s)  The format for your next appointment:   In Person  Provider:   Dina Rich, MD   Other Instructions

## 2020-09-12 DIAGNOSIS — N3001 Acute cystitis with hematuria: Secondary | ICD-10-CM | POA: Diagnosis not present

## 2020-09-12 DIAGNOSIS — Z20822 Contact with and (suspected) exposure to covid-19: Secondary | ICD-10-CM | POA: Diagnosis not present

## 2020-09-12 DIAGNOSIS — R059 Cough, unspecified: Secondary | ICD-10-CM | POA: Diagnosis not present

## 2020-09-12 DIAGNOSIS — R509 Fever, unspecified: Secondary | ICD-10-CM | POA: Diagnosis not present

## 2020-09-12 DIAGNOSIS — G809 Cerebral palsy, unspecified: Secondary | ICD-10-CM | POA: Diagnosis not present

## 2020-09-17 DIAGNOSIS — J45901 Unspecified asthma with (acute) exacerbation: Secondary | ICD-10-CM | POA: Diagnosis not present

## 2020-09-17 DIAGNOSIS — R32 Unspecified urinary incontinence: Secondary | ICD-10-CM | POA: Diagnosis not present

## 2020-09-17 DIAGNOSIS — G809 Cerebral palsy, unspecified: Secondary | ICD-10-CM | POA: Diagnosis not present

## 2020-09-17 DIAGNOSIS — R1310 Dysphagia, unspecified: Secondary | ICD-10-CM | POA: Diagnosis not present

## 2020-09-22 DIAGNOSIS — R300 Dysuria: Secondary | ICD-10-CM | POA: Diagnosis not present

## 2020-10-17 DIAGNOSIS — J45901 Unspecified asthma with (acute) exacerbation: Secondary | ICD-10-CM | POA: Diagnosis not present

## 2020-10-17 DIAGNOSIS — R1310 Dysphagia, unspecified: Secondary | ICD-10-CM | POA: Diagnosis not present

## 2020-10-17 DIAGNOSIS — R32 Unspecified urinary incontinence: Secondary | ICD-10-CM | POA: Diagnosis not present

## 2020-10-17 DIAGNOSIS — G809 Cerebral palsy, unspecified: Secondary | ICD-10-CM | POA: Diagnosis not present

## 2020-10-20 DIAGNOSIS — K219 Gastro-esophageal reflux disease without esophagitis: Secondary | ICD-10-CM | POA: Diagnosis not present

## 2020-10-20 DIAGNOSIS — G4731 Primary central sleep apnea: Secondary | ICD-10-CM | POA: Diagnosis not present

## 2020-10-20 DIAGNOSIS — K5900 Constipation, unspecified: Secondary | ICD-10-CM | POA: Diagnosis not present

## 2020-10-20 DIAGNOSIS — N319 Neuromuscular dysfunction of bladder, unspecified: Secondary | ICD-10-CM | POA: Diagnosis not present

## 2020-10-20 DIAGNOSIS — Z87440 Personal history of urinary (tract) infections: Secondary | ICD-10-CM | POA: Diagnosis not present

## 2020-10-20 DIAGNOSIS — R252 Cramp and spasm: Secondary | ICD-10-CM | POA: Diagnosis not present

## 2020-10-20 DIAGNOSIS — G800 Spastic quadriplegic cerebral palsy: Secondary | ICD-10-CM | POA: Diagnosis not present

## 2020-10-20 DIAGNOSIS — G40909 Epilepsy, unspecified, not intractable, without status epilepticus: Secondary | ICD-10-CM | POA: Diagnosis not present

## 2020-10-20 DIAGNOSIS — Z462 Encounter for fitting and adjustment of other devices related to nervous system and special senses: Secondary | ICD-10-CM | POA: Diagnosis not present

## 2020-10-27 DIAGNOSIS — G40219 Localization-related (focal) (partial) symptomatic epilepsy and epileptic syndromes with complex partial seizures, intractable, without status epilepticus: Secondary | ICD-10-CM | POA: Diagnosis not present

## 2020-10-27 DIAGNOSIS — G808 Other cerebral palsy: Secondary | ICD-10-CM | POA: Diagnosis not present

## 2020-10-29 DIAGNOSIS — Z23 Encounter for immunization: Secondary | ICD-10-CM | POA: Diagnosis not present

## 2020-11-17 DIAGNOSIS — G809 Cerebral palsy, unspecified: Secondary | ICD-10-CM | POA: Diagnosis not present

## 2020-11-17 DIAGNOSIS — R1310 Dysphagia, unspecified: Secondary | ICD-10-CM | POA: Diagnosis not present

## 2020-11-17 DIAGNOSIS — R32 Unspecified urinary incontinence: Secondary | ICD-10-CM | POA: Diagnosis not present

## 2020-11-17 DIAGNOSIS — J45901 Unspecified asthma with (acute) exacerbation: Secondary | ICD-10-CM | POA: Diagnosis not present

## 2020-11-24 DIAGNOSIS — Z23 Encounter for immunization: Secondary | ICD-10-CM | POA: Diagnosis not present

## 2020-11-24 DIAGNOSIS — J310 Chronic rhinitis: Secondary | ICD-10-CM | POA: Diagnosis not present

## 2020-11-24 DIAGNOSIS — G809 Cerebral palsy, unspecified: Secondary | ICD-10-CM | POA: Diagnosis not present

## 2020-11-24 DIAGNOSIS — G4730 Sleep apnea, unspecified: Secondary | ICD-10-CM | POA: Diagnosis not present

## 2020-11-24 DIAGNOSIS — Z9189 Other specified personal risk factors, not elsewhere classified: Secondary | ICD-10-CM | POA: Diagnosis not present

## 2020-12-01 DIAGNOSIS — R252 Cramp and spasm: Secondary | ICD-10-CM | POA: Diagnosis not present

## 2020-12-06 ENCOUNTER — Inpatient Hospital Stay (HOSPITAL_COMMUNITY)
Admission: EM | Admit: 2020-12-06 | Discharge: 2020-12-11 | DRG: 689 | Disposition: A | Discharge status: Home / Self Care | Payer: Medicare PPO | Attending: Family Medicine | Admitting: Family Medicine

## 2020-12-06 ENCOUNTER — Other Ambulatory Visit: Payer: Self-pay

## 2020-12-06 ENCOUNTER — Emergency Department (HOSPITAL_COMMUNITY): Payer: Medicare PPO

## 2020-12-06 ENCOUNTER — Encounter (HOSPITAL_COMMUNITY): Payer: Self-pay

## 2020-12-06 DIAGNOSIS — G919 Hydrocephalus, unspecified: Secondary | ICD-10-CM | POA: Diagnosis not present

## 2020-12-06 DIAGNOSIS — Z792 Long term (current) use of antibiotics: Secondary | ICD-10-CM

## 2020-12-06 DIAGNOSIS — R6511 Systemic inflammatory response syndrome (SIRS) of non-infectious origin with acute organ dysfunction: Secondary | ICD-10-CM | POA: Diagnosis not present

## 2020-12-06 DIAGNOSIS — Z79899 Other long term (current) drug therapy: Secondary | ICD-10-CM | POA: Diagnosis not present

## 2020-12-06 DIAGNOSIS — R509 Fever, unspecified: Secondary | ICD-10-CM | POA: Diagnosis not present

## 2020-12-06 DIAGNOSIS — E46 Unspecified protein-calorie malnutrition: Secondary | ICD-10-CM | POA: Diagnosis present

## 2020-12-06 DIAGNOSIS — N3000 Acute cystitis without hematuria: Secondary | ICD-10-CM

## 2020-12-06 DIAGNOSIS — G809 Cerebral palsy, unspecified: Secondary | ICD-10-CM

## 2020-12-06 DIAGNOSIS — Z800 Family history of malignant neoplasm of digestive organs: Secondary | ICD-10-CM | POA: Diagnosis not present

## 2020-12-06 DIAGNOSIS — G808 Other cerebral palsy: Secondary | ICD-10-CM | POA: Diagnosis not present

## 2020-12-06 DIAGNOSIS — G4731 Primary central sleep apnea: Secondary | ICD-10-CM | POA: Diagnosis present

## 2020-12-06 DIAGNOSIS — E440 Moderate protein-calorie malnutrition: Secondary | ICD-10-CM

## 2020-12-06 DIAGNOSIS — J9621 Acute and chronic respiratory failure with hypoxia: Secondary | ICD-10-CM | POA: Diagnosis not present

## 2020-12-06 DIAGNOSIS — I471 Supraventricular tachycardia: Secondary | ICD-10-CM | POA: Diagnosis not present

## 2020-12-06 DIAGNOSIS — J90 Pleural effusion, not elsewhere classified: Secondary | ICD-10-CM | POA: Diagnosis not present

## 2020-12-06 DIAGNOSIS — R0689 Other abnormalities of breathing: Secondary | ICD-10-CM

## 2020-12-06 DIAGNOSIS — Z681 Body mass index (BMI) 19 or less, adult: Secondary | ICD-10-CM

## 2020-12-06 DIAGNOSIS — R569 Unspecified convulsions: Secondary | ICD-10-CM | POA: Diagnosis present

## 2020-12-06 DIAGNOSIS — R0602 Shortness of breath: Secondary | ICD-10-CM | POA: Diagnosis not present

## 2020-12-06 DIAGNOSIS — Z8669 Personal history of other diseases of the nervous system and sense organs: Secondary | ICD-10-CM | POA: Diagnosis not present

## 2020-12-06 DIAGNOSIS — I10 Essential (primary) hypertension: Secondary | ICD-10-CM | POA: Diagnosis present

## 2020-12-06 DIAGNOSIS — Z880 Allergy status to penicillin: Secondary | ICD-10-CM

## 2020-12-06 DIAGNOSIS — R0600 Dyspnea, unspecified: Secondary | ICD-10-CM

## 2020-12-06 DIAGNOSIS — Z20822 Contact with and (suspected) exposure to covid-19: Secondary | ICD-10-CM | POA: Diagnosis present

## 2020-12-06 DIAGNOSIS — E8770 Fluid overload, unspecified: Secondary | ICD-10-CM | POA: Diagnosis present

## 2020-12-06 DIAGNOSIS — Z8249 Family history of ischemic heart disease and other diseases of the circulatory system: Secondary | ICD-10-CM | POA: Diagnosis not present

## 2020-12-06 DIAGNOSIS — K219 Gastro-esophageal reflux disease without esophagitis: Secondary | ICD-10-CM | POA: Diagnosis present

## 2020-12-06 DIAGNOSIS — N390 Urinary tract infection, site not specified: Principal | ICD-10-CM

## 2020-12-06 DIAGNOSIS — R64 Cachexia: Secondary | ICD-10-CM | POA: Diagnosis not present

## 2020-12-06 DIAGNOSIS — G800 Spastic quadriplegic cerebral palsy: Secondary | ICD-10-CM

## 2020-12-06 DIAGNOSIS — R109 Unspecified abdominal pain: Secondary | ICD-10-CM | POA: Diagnosis not present

## 2020-12-06 DIAGNOSIS — Z905 Acquired absence of kidney: Secondary | ICD-10-CM

## 2020-12-06 DIAGNOSIS — R000 Tachycardia, unspecified: Secondary | ICD-10-CM | POA: Diagnosis not present

## 2020-12-06 DIAGNOSIS — G4733 Obstructive sleep apnea (adult) (pediatric): Secondary | ICD-10-CM | POA: Diagnosis present

## 2020-12-06 DIAGNOSIS — R079 Chest pain, unspecified: Secondary | ICD-10-CM | POA: Diagnosis not present

## 2020-12-06 DIAGNOSIS — Z982 Presence of cerebrospinal fluid drainage device: Secondary | ICD-10-CM | POA: Diagnosis not present

## 2020-12-06 DIAGNOSIS — R6510 Systemic inflammatory response syndrome (SIRS) of non-infectious origin without acute organ dysfunction: Secondary | ICD-10-CM | POA: Diagnosis not present

## 2020-12-06 DIAGNOSIS — Z8709 Personal history of other diseases of the respiratory system: Secondary | ICD-10-CM | POA: Diagnosis not present

## 2020-12-06 DIAGNOSIS — A419 Sepsis, unspecified organism: Secondary | ICD-10-CM | POA: Diagnosis not present

## 2020-12-06 DIAGNOSIS — M545 Low back pain, unspecified: Secondary | ICD-10-CM | POA: Diagnosis not present

## 2020-12-06 DIAGNOSIS — Z881 Allergy status to other antibiotic agents status: Secondary | ICD-10-CM | POA: Diagnosis not present

## 2020-12-06 DIAGNOSIS — I517 Cardiomegaly: Secondary | ICD-10-CM | POA: Diagnosis not present

## 2020-12-06 DIAGNOSIS — J9600 Acute respiratory failure, unspecified whether with hypoxia or hypercapnia: Secondary | ICD-10-CM | POA: Diagnosis not present

## 2020-12-06 DIAGNOSIS — N3289 Other specified disorders of bladder: Secondary | ICD-10-CM | POA: Diagnosis not present

## 2020-12-06 DIAGNOSIS — Z888 Allergy status to other drugs, medicaments and biological substances status: Secondary | ICD-10-CM

## 2020-12-06 DIAGNOSIS — Z8262 Family history of osteoporosis: Secondary | ICD-10-CM | POA: Diagnosis not present

## 2020-12-06 DIAGNOSIS — N400 Enlarged prostate without lower urinary tract symptoms: Secondary | ICD-10-CM | POA: Diagnosis not present

## 2020-12-06 LAB — CBC WITH DIFFERENTIAL/PLATELET
Abs Immature Granulocytes: 0.08 10*3/uL — ABNORMAL HIGH (ref 0.00–0.07)
Basophils Absolute: 0 10*3/uL (ref 0.0–0.1)
Basophils Relative: 0 %
Eosinophils Absolute: 0.1 10*3/uL (ref 0.0–0.5)
Eosinophils Relative: 0 %
HCT: 47.9 % (ref 39.0–52.0)
Hemoglobin: 16.1 g/dL (ref 13.0–17.0)
Immature Granulocytes: 1 %
Lymphocytes Relative: 4 %
Lymphs Abs: 0.7 10*3/uL (ref 0.7–4.0)
MCH: 31.8 pg (ref 26.0–34.0)
MCHC: 33.6 g/dL (ref 30.0–36.0)
MCV: 94.5 fL (ref 80.0–100.0)
Monocytes Absolute: 1.4 10*3/uL — ABNORMAL HIGH (ref 0.1–1.0)
Monocytes Relative: 8 %
Neutro Abs: 14.6 10*3/uL — ABNORMAL HIGH (ref 1.7–7.7)
Neutrophils Relative %: 87 %
Platelets: 211 10*3/uL (ref 150–400)
RBC: 5.07 MIL/uL (ref 4.22–5.81)
RDW: 13.8 % (ref 11.5–15.5)
WBC: 17 10*3/uL — ABNORMAL HIGH (ref 4.0–10.5)
nRBC: 0 % (ref 0.0–0.2)

## 2020-12-06 LAB — COMPREHENSIVE METABOLIC PANEL
ALT: 15 U/L (ref 0–44)
AST: 16 U/L (ref 15–41)
Albumin: 4.1 g/dL (ref 3.5–5.0)
Alkaline Phosphatase: 112 U/L (ref 38–126)
Anion gap: 9 (ref 5–15)
BUN: 13 mg/dL (ref 6–20)
CO2: 28 mmol/L (ref 22–32)
Calcium: 9.4 mg/dL (ref 8.9–10.3)
Chloride: 102 mmol/L (ref 98–111)
Creatinine, Ser: 0.72 mg/dL (ref 0.61–1.24)
GFR, Estimated: >60 mL/min (ref >60)
Glucose, Bld: 106 mg/dL — ABNORMAL HIGH (ref 70–99)
Potassium: 3.3 mmol/L — ABNORMAL LOW (ref 3.5–5.1)
Sodium: 139 mmol/L (ref 135–145)
Total Bilirubin: 0.6 mg/dL (ref 0.3–1.2)
Total Protein: 7.3 g/dL (ref 6.5–8.1)

## 2020-12-06 LAB — PROTIME-INR
INR: 1 (ref 0.8–1.2)
Prothrombin Time: 13 seconds (ref 11.4–15.2)

## 2020-12-06 LAB — URINALYSIS, ROUTINE W REFLEX MICROSCOPIC
Bilirubin Urine: NEGATIVE
Glucose, UA: 50 mg/dL — AB
Ketones, ur: NEGATIVE mg/dL
Nitrite: NEGATIVE
Protein, ur: NEGATIVE mg/dL
Specific Gravity, Urine: 1.008 (ref 1.005–1.030)
WBC, UA: >50 WBC/hpf — ABNORMAL HIGH (ref 0–5)
pH: 7 (ref 5.0–8.0)

## 2020-12-06 LAB — LACTIC ACID, PLASMA
Lactic Acid, Venous: 1.1 mmol/L (ref 0.5–1.9)
Lactic Acid, Venous: 2.3 mmol/L (ref 0.5–1.9)

## 2020-12-06 LAB — RESP PANEL BY RT-PCR (FLU A&B, COVID) ARPGX2
Influenza A by PCR: NEGATIVE
Influenza B by PCR: NEGATIVE
SARS Coronavirus 2 by RT PCR: NEGATIVE

## 2020-12-06 LAB — APTT: aPTT: 32 seconds (ref 24–36)

## 2020-12-06 MED ORDER — SODIUM CHLORIDE 0.9 % IV SOLN
2.0000 g | Freq: Once | INTRAVENOUS | Status: AC
  Administered 2020-12-06: 2 g via INTRAVENOUS
  Filled 2020-12-06: qty 2

## 2020-12-06 MED ORDER — DILTIAZEM HCL ER 60 MG PO CP12
60.0000 mg | ORAL_CAPSULE | Freq: Two times a day (BID) | ORAL | Status: DC
  Administered 2020-12-06 – 2020-12-07 (×2): 60 mg via ORAL
  Filled 2020-12-06 (×3): qty 1
  Filled 2020-12-06: qty 4
  Filled 2020-12-06 (×3): qty 1

## 2020-12-06 MED ORDER — SODIUM CHLORIDE 0.9 % IV SOLN: 2.0000 g | Freq: Three times a day (TID) | INTRAVENOUS | Status: DC

## 2020-12-06 MED ORDER — ACETAMINOPHEN 325 MG PO TABS
650.0000 mg | ORAL_TABLET | Freq: Once | ORAL | Status: AC
  Administered 2020-12-06: 650 mg via ORAL
  Filled 2020-12-06: qty 2

## 2020-12-06 MED ORDER — PANTOPRAZOLE SODIUM 40 MG PO TBEC
40.0000 mg | DELAYED_RELEASE_TABLET | Freq: Every day | ORAL | Status: DC
  Administered 2020-12-07 – 2020-12-11 (×5): 40 mg via ORAL
  Filled 2020-12-06 (×5): qty 1

## 2020-12-06 MED ORDER — ACETAMINOPHEN 650 MG RE SUPP
650.0000 mg | Freq: Four times a day (QID) | RECTAL | Status: DC | PRN
  Administered 2020-12-07: 650 mg via RECTAL
  Filled 2020-12-06: qty 1

## 2020-12-06 MED ORDER — ACETAMINOPHEN 325 MG PO TABS
650.0000 mg | ORAL_TABLET | Freq: Four times a day (QID) | ORAL | Status: DC | PRN
  Administered 2020-12-06 – 2020-12-08 (×4): 650 mg via ORAL
  Filled 2020-12-06 (×4): qty 2

## 2020-12-06 MED ORDER — ENOXAPARIN SODIUM 40 MG/0.4ML IJ SOSY
40.0000 mg | PREFILLED_SYRINGE | INTRAMUSCULAR | Status: DC
  Administered 2020-12-06 – 2020-12-10 (×5): 40 mg via SUBCUTANEOUS
  Filled 2020-12-06 (×5): qty 0.4

## 2020-12-06 MED ORDER — LACOSAMIDE 50 MG PO TABS
150.0000 mg | ORAL_TABLET | Freq: Every morning | ORAL | Status: DC
  Administered 2020-12-07 – 2020-12-11 (×5): 150 mg via ORAL
  Filled 2020-12-06 (×5): qty 3

## 2020-12-06 MED ORDER — LACOSAMIDE 50 MG PO TABS
200.0000 mg | ORAL_TABLET | Freq: Every day | ORAL | Status: DC
  Administered 2020-12-06 – 2020-12-10 (×4): 200 mg via ORAL
  Filled 2020-12-06 (×4): qty 4

## 2020-12-06 MED ORDER — CYCLOSPORINE 0.05 % OP EMUL
1.0000 [drp] | Freq: Two times a day (BID) | OPHTHALMIC | Status: DC
  Administered 2020-12-06 – 2020-12-11 (×10): 1 [drp] via OPHTHALMIC
  Filled 2020-12-06 (×10): qty 30

## 2020-12-06 MED ORDER — SODIUM CHLORIDE 3 % IN NEBU
4.0000 mL | INHALATION_SOLUTION | Freq: Two times a day (BID) | RESPIRATORY_TRACT | Status: AC
  Administered 2020-12-07 – 2020-12-09 (×5): 4 mL via RESPIRATORY_TRACT
  Filled 2020-12-06 (×11): qty 4

## 2020-12-06 MED ORDER — ONDANSETRON HCL 4 MG/2ML IJ SOLN: 4.0000 mg | Freq: Four times a day (QID) | INTRAMUSCULAR | Status: DC | PRN

## 2020-12-06 MED ORDER — LINACLOTIDE 145 MCG PO CAPS
145.0000 ug | ORAL_CAPSULE | Freq: Every day | ORAL | Status: DC
Start: 2020-12-06 — End: 2020-12-11
  Administered 2020-12-07 – 2020-12-11 (×5): 145 ug via ORAL
  Filled 2020-12-06 (×6): qty 1

## 2020-12-06 MED ORDER — POLYETHYLENE GLYCOL 3350 17 G PO PACK: 8.5000 g | PACK | Freq: Every day | ORAL | Status: DC | PRN

## 2020-12-06 MED ORDER — SUCRALFATE 1 GM/10ML PO SUSP
0.5000 g | Freq: Two times a day (BID) | ORAL | Status: DC
  Administered 2020-12-07 – 2020-12-11 (×9): 0.5 g via ORAL
  Filled 2020-12-06 (×9): qty 10

## 2020-12-06 MED ORDER — SODIUM CHLORIDE 0.9 % IV SOLN
1.0000 g | INTRAVENOUS | Status: DC
  Administered 2020-12-07: 1 g via INTRAVENOUS
  Filled 2020-12-06: qty 10

## 2020-12-06 MED ORDER — TAMSULOSIN HCL 0.4 MG PO CAPS
0.4000 mg | ORAL_CAPSULE | Freq: Every day | ORAL | Status: DC
  Administered 2020-12-07 – 2020-12-11 (×5): 0.4 mg via ORAL
  Filled 2020-12-06 (×5): qty 1

## 2020-12-06 MED ORDER — ONDANSETRON HCL 4 MG PO TABS: 4.0000 mg | ORAL_TABLET | Freq: Four times a day (QID) | ORAL | Status: DC | PRN

## 2020-12-06 MED ORDER — LACTATED RINGERS IV SOLN: INTRAVENOUS | Status: DC

## 2020-12-06 MED ORDER — SODIUM CHLORIDE 0.9 % IV BOLUS
2000.0000 mL | Freq: Once | INTRAVENOUS | Status: AC
  Administered 2020-12-06: 2000 mL via INTRAVENOUS

## 2020-12-06 NOTE — ED Notes (Addendum)
Date and time results received: 12/06/20 2035  Test: lactic acid  Critical Value: 2.3  Name of Provider Notified: Dr. Adrian Blackwater   Orders Received? Or Actions Taken?: Acknowledged

## 2020-12-06 NOTE — H&P (Signed)
History and Physical  Joshua C Brauner [id number] DOB: 1980/10/31 DOA: 12/06/2020  Referring physician: Dr Roderic Palau, ED physician PCP: Manon Hilding, MD  Outpatient Specialists:   Patient Coming From: home  Chief Complaint: Fever  HPI: Joshua Logan is a 40 y.o. male with a history of spastic quadriplegia can [place] to cerebral palsy, hydrocephalus with VP shunt, GERD, chronic protein calorie malnutrition, chronic tachycardia.  Due to the cerebral palsy, the patient is largely nonverbal and history is obtained by the patient's mother.  Patient has had frequent UTIs and is currently on prophylaxis.  Patient developed a fever today of 101.8 rectally.  He was given Tylenol at 11:00.  He also began having suprapubic pain and was brought to the hospital for evaluation.  No palliating or provoking factors.  Emergency Department Course: Suggestive of UTI.  Patient given aztreonam and urine culture obtained.  Blood cultures were also obtained.  White count of 17,000.  Initial lactic acid 1.1 with repeat of 2.3.  Review of Systems:  Patient unable to provide  Past Medical History:  Diagnosis Date   Cerebral palsy ([employer])    with spastic quadriparesis with numerous orthopedic procedures for contractures and fratures   Chronic constipation    Chronic pulmonary aspiration    CSA (central sleep apnea)    GERD (gastroesophageal reflux disease)    Hydrocephalus ([facility])    s/p VP shunt   Hypoxemia    chronic   Iron deficiency anemia    OSA (obstructive sleep apnea)    Protein calorie malnutrition ([place])    chronic   Tachycardia    chronic   Past Surgical History:  Procedure Laterality Date   BACK SURGERY     [place] RODS PLACED FOR SCOLIOSIS   BACLOFEN PUMP PLACEMENT     CSF SHUNT     EYE SURGERY     MULTIPLE   HIP SURGERY     MULTIPLE   INGUINAL HERNIA REPAIR     BILATERAL   NEPHRECTOMY     [place]   VENTRICULAR ATRIAL SHUNT     Social History:  reports that he has never  smoked. He has never used smokeless tobacco. He reports that he does not drink alcohol and does not use drugs. Patient lives at home  Allergies  Allergen Reactions   Amoxicillin-Pot Clavulanate Other (See Comments) and Rash    blisters Blisters. Pt can take Amoxicillin, but NOT Augmentin    Sulfamethoxazole-Trimethoprim Rash   Doxycycline Other (See Comments)    Other reaction(s): Other (See Comments) Bad reflux Bad reflux    Whole Blood    Cefazolin Rash   Midazolam Hcl Other (See Comments)    Un-reactive pupils after surgery   Septra [Bactrim] Hives   Vancomycin Rash    Family History  Problem Relation Age of Onset   Colon cancer Father    Hypertension Father    Osteoporosis Mother    Osteoarthritis Mother        in her back      Prior to Admission medications   Medication Sig Start Date End Date Taking? Authorizing Provider  amoxicillin (AMOXIL) 500 MG capsule Take 4 capsules by mouth as directed. Prior to dental procedures 11/27/12  Yes [provider]  BACLOFEN IT 1,449 mcg by Intrathecal route daily.   Yes [provider]  cephALEXin (KEFLEX) 250 MG capsule  12/15/15  Yes [provider]  diltiazem (CARDIZEM SR) 60 MG 12 hr capsule Take 1 capsule (60 mg  total) by mouth 2 (two) times daily. 09/10/20  Yes BranchAlphonse Guild, MD  ketoconazole (NIZORAL) 2 % shampoo Apply 1 application topically as needed. 11/20/12  Yes [provider]  Lacosamide (VIMPAT) 150 MG TABS Take 1 tablet by mouth every morning.   Yes [provider]  lacosamide (VIMPAT) 200 MG TABS tablet Take 200 mg by mouth at bedtime.   Yes [provider]  lidocaine-prilocaine (EMLA) cream Apply topically. 12/01/20  Yes [provider]  LINZESS 145 MCG CAPS Take 145 mcg by mouth daily.  09/06/11  Yes [provider]  nitrofurantoin, macrocrystal-monohydrate, (MACROBID) 100 MG capsule Take 1 capsule by mouth daily. 05/07/14  Yes [provider]  omeprazole (PRILOSEC) 40 MG capsule Take 1 capsule by mouth 2 (two) times daily. 09/02/14  Yes [provider]  polyethylene glycol (MIRALAX / GLYCOLAX) packet Take 8.5 g by mouth daily as needed.   Yes [provider]  RESTASIS 0.05 % ophthalmic emulsion 1 drop 2 (two) times daily. 08/31/20  Yes [provider]  sodium chloride HYPERTONIC 3 % nebulizer solution Take by nebulization 2 (two) times daily.   Yes [provider]  sucralfate (CARAFATE) 1 GM/10ML suspension Take 500 mg by mouth 2 (two) times daily.   Yes [provider]  tamsulosin (FLOMAX) 0.4 MG CAPS capsule Take 0.4 mg by mouth daily. 12/17/12  Yes [provider]  tretinoin (RETIN-A) 0.025 % cream Apply a a thin layer to affected areas nightly 20 min after washing. Start with two nights weekly and increase as tolerated. 05/30/13  Yes [provider]  Urea 40 % LOTN APPLY 1-2 TIMES DAILY TO KERTOSIS PILARIS ON ARMS 01/15/14  Yes [provider]  Lacosamide 150 MG TABS Take 1 tablet by mouth daily. Patient not taking: Reported on 12/06/2020    [provider]    Physical Exam: BP (!) 149/114   Pulse (!) 129   Temp (!) 100.9 F (38.3 C) (Rectal)   Resp 17   Ht 5\' 6"  (1.676 m)   Wt 53.5 kg   SpO2 91%   BMI 19.04 kg/m   General: Young male. Awake and alert. No acute cardiopulmonary distress.  HEENT: Normocephalic atraumatic.  Right and left ears normal in appearance.  Pupils equal, round, reactive to light. Extraocular muscles are intact. Sclerae anicteric and noninjected.  Moist mucosal membranes. No mucosal lesions.  Neck: Neck supple without lymphadenopathy. No carotid bruits. No masses palpated.  Cardiovascular: Tachycardic regular rate with normal S1-S2 sounds. No murmurs, rubs, gallops auscultated. No JVD.  Respiratory: Good respiratory effort with no wheezes, rales, rhonchi. Lungs clear to auscultation bilaterally.  No accessory  muscle use. Abdomen: Soft, suprapubic tenderness.  No rebound or guarding. Nondistended. Active bowel sounds. No masses or hepatosplenomegaly  Skin: No rashes, lesions, or ulcerations.  Dry, warm to touch. 2+ dorsalis pedis and radial pulses. Musculoskeletal: Significant contractures of his upper and lower extremities.  He has some gross motor movements.  No major gross deformities of his major joints. Psychiatric: Intact judgment and insight. Pleasant and cooperative. Neurologic: Has limited mobility of his arms and legs.           Labs on Admission: I have personally reviewed following labs and imaging studies  CBC: Recent Labs  Lab 12/06/20 1644  WBC 17.0*  NEUTROABS 14.6*  HGB 16.1  HCT 47.9  MCV 94.5  PLT 123456   Basic Metabolic Panel: Recent Labs  Lab 12/06/20 1644  NA 139  K 3.3*  CL 102  CO2 28  GLUCOSE 106*  BUN 13  CREATININE 0.72  CALCIUM 9.4   GFR: Estimated Creatinine Clearance: 93.8 mL/min (by C-G formula based on SCr of 0.72 mg/dL). Liver Function Tests: Recent Labs  Lab 12/06/20 1644  AST 16  ALT 15  ALKPHOS 112  BILITOT 0.6  PROT 7.3  ALBUMIN 4.1   No results for input(s): LIPASE, AMYLASE in the last 168 hours. No results for input(s): AMMONIA in the last 168 hours. Coagulation Profile: Recent Labs  Lab 12/06/20 1644  INR 1.0   Cardiac Enzymes: No results for input(s): CKTOTAL, CKMB, CKMBINDEX, TROPONINI in the last 168 hours. BNP (last 3 results) No results for input(s): PROBNP in the last 8760 hours. HbA1C: No results for input(s): HGBA1C in the last 72 hours. CBG: No results for input(s): GLUCAP in the last 168 hours. Lipid Profile: No results for input(s): CHOL, HDL, LDLCALC, TRIG, CHOLHDL, LDLDIRECT in the last 72 hours. Thyroid Function Tests: No results for input(s): TSH, T4TOTAL, FREET4, T3FREE, THYROIDAB in the last 72 hours. Anemia Panel: No results for input(s): VITAMINB12, FOLATE, FERRITIN, TIBC, IRON, RETICCTPCT in the  last 72 hours. Urine analysis:    Component Value Date/Time   COLORURINE YELLOW 12/06/2020 1745   APPEARANCEUR HAZY (A) 12/06/2020 1745   LABSPEC 1.008 12/06/2020 1745   PHURINE 7.0 12/06/2020 1745   GLUCOSEU 50 (A) 12/06/2020 1745   HGBUR MODERATE (A) 12/06/2020 1745   BILIRUBINUR NEGATIVE 12/06/2020 [place] 12/06/2020 1745   PROTEINUR NEGATIVE 12/06/2020 1745   NITRITE NEGATIVE 12/06/2020 1745   LEUKOCYTESUR LARGE (A) 12/06/2020 1745   Sepsis Labs: @LABRCNTIP (procalcitonin:4,lacticidven:4) ) Recent Results (from the past 240 hour(s))  Blood Culture (routine x 2)     Status: None (Preliminary result)   Collection Time: 12/06/20  3:32 PM   Specimen: BLOOD LEFT ARM  Result Value Ref Range Status   Specimen Description BLOOD LEFT ARM BOTTLES DRAWN AEROBIC AND ANAEROBIC  Final   Special Requests   Final    Blood Culture adequate volume Performed at [facility], [address] [place],  [address]    Culture PENDING  Incomplete   Report Status PENDING  Incomplete  Resp Panel by RT-PCR (Flu A&B, Covid) Nasopharyngeal Swab     Status: None   Collection Time: 12/06/20  4:10 PM   Specimen: Nasopharyngeal Swab; Nasopharyngeal(NP) swabs in vial transport medium  Result Value Ref Range Status   SARS Coronavirus 2 by RT PCR NEGATIVE NEGATIVE Final    Comment: (NOTE) SARS-CoV-2 target nucleic acids are NOT DETECTED.  The SARS-CoV-2 RNA is generally detectable in upper respiratory specimens during the acute phase of infection. The lowest concentration of SARS-CoV-2 viral copies this assay can detect is 138 copies/mL. A negative result does not preclude SARS-Cov-2 infection and should not be used as the sole basis for treatment or other patient management decisions. A negative result may occur with  improper specimen collection/handling, submission of specimen other than nasopharyngeal swab, presence of viral mutation(s) within the areas targeted by this  assay, and inadequate number of viral copies(<138 copies/mL). A negative result must be combined with clinical observations, patient history, and epidemiological information. The expected result is Negative.  Fact Sheet for Patients:  EntrepreneurPulse.com.au  Fact Sheet for Healthcare Providers:  IncredibleEmployment.be  This test is no t yet approved or cleared by the [place] FDA and  has been authorized for detection and/or diagnosis of SARS-CoV-2 by FDA under an  Emergency Use Authorization (EUA). This EUA will remain  in effect (meaning this test can be used) for the duration of the COVID-19 declaration under Section 564(b)(1) of the Act, 21 U.S.C.section 360bbb-3(b)(1), unless the authorization is terminated  or revoked sooner.       Influenza A by PCR NEGATIVE NEGATIVE Final   Influenza B by PCR NEGATIVE NEGATIVE Final    Comment: (NOTE) The Xpert Xpress SARS-CoV-2/FLU/RSV plus assay is intended as an aid in the diagnosis of influenza from Nasopharyngeal swab specimens and should not be used as a sole basis for treatment. Nasal washings and aspirates are unacceptable for Xpert Xpress SARS-CoV-2/FLU/RSV testing.  Fact Sheet for Patients: BloggerCourse.com  Fact Sheet for Healthcare Providers: SeriousBroker.it  This test is not yet approved or cleared by the [place] FDA and has been authorized for detection and/or diagnosis of SARS-CoV-2 by FDA under an Emergency Use Authorization (EUA). This EUA will remain in effect (meaning this test can be used) for the duration of the COVID-19 declaration under Section 564(b)(1) of the Act, 21 U.S.C. section 360bbb-3(b)(1), unless the authorization is terminated or revoked.  Performed at [facility], [address] [place], [place] [address]   Culture, blood (Routine X 2) w Reflex to ID Panel     Status: None (Preliminary result)    Collection Time: 12/06/20  5:17 PM   Specimen: BLOOD LEFT HAND  Result Value Ref Range Status   Specimen Description   Final    BLOOD LEFT HAND BOTTLES DRAWN AEROBIC AND ANAEROBIC   Special Requests   Final    Blood Culture adequate volume Performed at [facility], [address], [place], [place] [address]    Culture PENDING  Incomplete   Report Status PENDING  Incomplete     Radiological Exams on Admission: DG Chest Port 1 View  Result Date: 12/06/2020 CLINICAL DATA:  Abdominal pain and back pain.  Possible sepsis. EXAM: PORTABLE CHEST 1 VIEW COMPARISON:  09/12/2020 FINDINGS: Partially visualized thoracolumbar fixation hardware. Stable shunt catheters. Stable cardiomediastinal contours. Slightly low lung volumes. No focal airspace consolidation, pleural effusion, or pneumothorax. IMPRESSION: No acute cardiopulmonary disease. Electronically Signed   By: Duanne Guess D.O.   On: 12/06/2020 16:11   CT Renal Stone Study  Result Date: 12/06/2020 CLINICAL DATA:  Lower abdominal back pain, fever EXAM: CT ABDOMEN AND PELVIS WITHOUT CONTRAST TECHNIQUE: Multidetector CT imaging of the abdomen and pelvis was performed following the standard protocol without IV contrast. Unenhanced CT was performed per clinician order. Lack of IV contrast limits sensitivity and specificity, especially for evaluation of abdominal/pelvic solid viscera. COMPARISON:  None. FINDINGS: Lower chest: Minimal hypoventilatory changes at the lung bases. No acute airspace disease. Hepatobiliary: No focal liver abnormality is seen. No gallstones, gallbladder wall thickening, or biliary dilatation. Pancreas: Unremarkable. No pancreatic ductal dilatation or surrounding inflammatory changes. Spleen: Normal in size without focal abnormality. Adrenals/Urinary Tract: The left kidney is surgically absent. Right kidney is unremarkable without nephrolithiasis or hydronephrosis. Bladder is moderately distended without focal abnormality.  Adrenals are grossly normal. Stomach/Bowel: No bowel obstruction or ileus. Significant retained stool throughout the colon compatible constipation. Normal appendix right lower quadrant. No bowel wall thickening or inflammatory change. Vascular/Lymphatic: No significant vascular findings are present. No enlarged abdominal or pelvic lymph nodes. Reproductive: Moderate enlargement the prostate measuring up to 5.8 x 3.6 cm in transverse dimension. Other: No free fluid or free gas. No abdominal wall hernia. Implanted pump within the left lower quadrant anterior abdominal wall, with  catheter tubing extending toward the central canal. The superior extent of the tubing is excluded by slice selection. Musculoskeletal: Postsurgical changes are seen from thoracolumbar fusion. There are no acute displaced fractures. Congenital deformities of the bilateral hips, with postsurgical changes of the proximal right femur. Reconstructed images demonstrate no additional findings. IMPRESSION: 1. No evidence of urinary tract calculi or obstructive uropathy. Patient is status post left nephrectomy. 2. Significant retained stool throughout the colon consistent with constipation. No bowel obstruction or ileus. 3. Enlarged prostate. Electronically Signed   By: Randa Ngo M.D.   On: 12/06/2020 18:56    EKG: Independently reviewed.  Sinus tachycardia.  Flattened T waves in lateral leads otherwise no acute ST changes.  Assessment/Plan: Principal Problem:   Sepsis ([place]) Active Problems:   PSVT (paroxysmal supraventricular tachycardia) (HCC)   Cerebral palsy (HCC)   Hydrocephalus (HCC)   Protein calorie malnutrition (HCC)   GERD (gastroesophageal reflux disease)   Acute lower UTI    This patient was discussed with the ED physician, including pertinent vitals, physical exam findings, labs, and imaging.  We also discussed care given by the ED provider.  Sepsis secondary to acute lower UTI Admit Change antibiotics to Rocephin  as the patient has tolerated this in the past. Blood cultures, urine cultures Patient in the process of IV fluid rehydration Recheck lactic acid CBC, BMP in the morning Cerebral palsy with hydrocephalus and quadriparesis Air mattress to help prevent pressure ulcers Paroxysmal supraventricular tachycardia GERD PPI   DVT prophylaxis: Lovenox Consultants: none Code Status: full Family Communication: mother present during interview and exam  Disposition Plan:    Truett Mainland, DO

## 2020-12-06 NOTE — Sepsis Progress Note (Signed)
Sepsis protocol is being monitored by eLink. 

## 2020-12-06 NOTE — ED Triage Notes (Signed)
Lower abdominal pain and lower back pain x Fever up to 101.8 rectally at home. Pt had Acetaminophen at 1100.

## 2020-12-06 NOTE — Progress Notes (Signed)
Pharmacy Antibiotic Note  Joshua Logan is a 40 y.o. male admitted on 12/06/2020 with UTI.  Pharmacy has been consulted for aztreonam dosing.  Plan: Aztreonam 2000 mg IV every 8 hours. Monitor labs, c/s, and patient improvement.  Height: 5\' 6"  (167.6 cm) Weight: 53.5 kg (117 lb 15.1 oz) IBW/kg (Calculated) : 63.8  Temp (24hrs), Avg:100.8 F (38.2 C), Min:100.8 F (38.2 C), Max:100.8 F (38.2 C)  Recent Labs  Lab 12/06/20 1644  WBC 17.0*  CREATININE 0.72  LATICACIDVEN 1.1    Estimated Creatinine Clearance: 93.8 mL/min (by C-G formula based on SCr of 0.72 mg/dL).    Allergies  Allergen Reactions   Amoxicillin-Pot Clavulanate Other (See Comments) and Rash    blisters Blisters. Pt can take Amoxicillin, but NOT Augmentin    Sulfamethoxazole-Trimethoprim Rash   Doxycycline Other (See Comments)    Other reaction(s): Other (See Comments) Bad reflux Bad reflux    Whole Blood    Cefazolin Rash   Midazolam Hcl Other (See Comments)    Un-reactive pupils after surgery   Septra [Bactrim] Hives   Vancomycin Rash    Antimicrobials this admission: Aztreonam 11/20 >>   Microbiology results: 11/20 BCx: pending 11/20 UCx: pending    Thank you for allowing pharmacy to be a part of this patient's care.  12/20 12/06/2020 6:21 PM

## 2020-12-06 NOTE — ED Notes (Signed)
Pt rectal temp 100.9, pt found in thick fleece clothing and multiple pairs of socks on as well as blankets. Pt was undressed and this RN explained to pts mother at bedside that since he does have a temp that it would be best to undress him from all the layers.

## 2020-12-06 NOTE — ED Notes (Signed)
CT just called and stated IV is infiltrated. Nurse stated to turn NS off.

## 2020-12-06 NOTE — ED Notes (Signed)
Pt is warm to touch in triage, unable to get temp orally, will need rectal temp when placed in a bed.

## 2020-12-06 NOTE — ED Notes (Signed)
Pt is a hard stick 

## 2020-12-06 NOTE — ED Notes (Signed)
2 nurse attempted second IV with ultrasound unsuccessful x 2 each.

## 2020-12-06 NOTE — ED Provider Notes (Signed)
[facility] EMERGENCY DEPARTMENT Provider Note   CSN: [id number] Arrival date & time: 12/06/20  1422     History Chief Complaint  Patient presents with   Abdominal Pain   Back Pain    Lower abdominal and lower back pain. Pt thinks that it may be a kidney stone.    Joshua Logan is a 40 y.o. male.  Patient has cerebral palsy.  Patient developed a fever today.  His mother states that he frequently has urinary tract infections and sometimes pneumonias.  The history is provided by a relative and the patient.  Abdominal Pain Pain location:  Suprapubic Pain quality: aching   Pain radiates to:  Does not radiate Pain severity:  Mild Onset quality:  Sudden Timing:  Intermittent Progression:  Waxing and waning Chronicity:  New Context: not alcohol use   Associated symptoms: no chest pain, no cough, no diarrhea, no fatigue and no hematuria   Back Pain Associated symptoms: abdominal pain   Associated symptoms: no chest pain and no headaches       Past Medical History:  Diagnosis Date   Cerebral palsy (HCC)    with spastic quadriparesis with numerous orthopedic procedures for contractures and fratures   Chronic constipation    Chronic pulmonary aspiration    CSA (central sleep apnea)    GERD (gastroesophageal reflux disease)    Hydrocephalus (HCC)    s/p VP shunt   Hypoxemia    chronic   Iron deficiency anemia    OSA (obstructive sleep apnea)    Protein calorie malnutrition (HCC)    chronic   Tachycardia    chronic    Patient Active Problem List   Diagnosis Date Noted   PSVT (paroxysmal supraventricular tachycardia) ([place]) 05/19/2011   Altered mental status 05/03/2011   Tachycardia    OSA (obstructive sleep apnea)     Past Surgical History:  Procedure Laterality Date   BACK SURGERY     [place] RODS PLACED FOR SCOLIOSIS   BACLOFEN PUMP PLACEMENT     CSF SHUNT     EYE SURGERY     MULTIPLE   HIP SURGERY     MULTIPLE   INGUINAL HERNIA REPAIR      BILATERAL   NEPHRECTOMY     [facility]   VENTRICULAR ATRIAL SHUNT         Family History  Problem Relation Age of Onset   Colon cancer Father    Hypertension Father    Osteoporosis Mother    Osteoarthritis Mother        in her back    Social History   Tobacco Use   Smoking status: Never   Smokeless tobacco: Never  Substance Use Topics   Alcohol use: No    Alcohol/week: 0.0 standard drinks   Drug use: No    Home Medications Prior to Admission medications   Medication Sig Start Date End Date Taking? Authorizing Provider  amoxicillin (AMOXIL) 500 MG capsule Take 4 capsules by mouth as directed. Prior to dental procedures 11/27/12  Yes [provider]  BACLOFEN IT 1,449 mcg by Intrathecal route daily.   Yes [provider]  cephALEXin (KEFLEX) 250 MG capsule  12/15/15  Yes [provider]  diltiazem (CARDIZEM SR) 60 MG 12 hr capsule Take 1 capsule (60 mg total) by mouth 2 (two) times daily. 09/10/20  Yes BranchAlphonse Guild, MD  ketoconazole (NIZORAL) 2 % shampoo Apply 1 application topically as needed. 11/20/12  Yes [provider]  Lacosamide (VIMPAT) 150 MG TABS Take 1 tablet by mouth every morning.   Yes [provider]  lacosamide (VIMPAT) 200 MG TABS tablet Take 200 mg by mouth at bedtime.   Yes [provider]  lidocaine-prilocaine (EMLA) cream Apply topically. 12/01/20  Yes [provider]  LINZESS 145 MCG CAPS Take 145 mcg by mouth daily.  09/06/11  Yes [provider]  nitrofurantoin, macrocrystal-monohydrate, (MACROBID) 100 MG capsule Take 1 capsule by mouth daily. 05/07/14  Yes [provider]  omeprazole (PRILOSEC) 40 MG capsule Take 1 capsule by mouth 2 (two) times daily. 09/02/14  Yes [provider]  polyethylene glycol (MIRALAX / GLYCOLAX) packet Take 8.5 g by mouth daily as needed.   Yes [provider]  RESTASIS 0.05 % ophthalmic emulsion 1 drop 2 (two) times  daily. 08/31/20  Yes [provider]  sodium chloride HYPERTONIC 3 % nebulizer solution Take by nebulization 2 (two) times daily.   Yes [provider]  sucralfate (CARAFATE) 1 GM/10ML suspension Take 500 mg by mouth 2 (two) times daily.   Yes [provider]  tamsulosin (FLOMAX) 0.4 MG CAPS capsule Take 0.4 mg by mouth daily. 12/17/12  Yes [provider]  tretinoin (RETIN-A) 0.025 % cream Apply a a thin layer to affected areas nightly 20 min after washing. Start with two nights weekly and increase as tolerated. 05/30/13  Yes [provider]  Urea 40 % LOTN APPLY 1-2 TIMES DAILY TO KERTOSIS PILARIS ON ARMS 01/15/14  Yes [provider]  Lacosamide 150 MG TABS Take 1 tablet by mouth daily. Patient not taking: Reported on 12/06/2020    [provider]    Allergies    Amoxicillin-pot clavulanate, Sulfamethoxazole-trimethoprim, Doxycycline, Whole blood, Cefazolin, Midazolam hcl, Septra [bactrim], and Vancomycin  Review of Systems   Review of Systems  Constitutional:  Negative for appetite change and fatigue.  HENT:  Negative for congestion, ear discharge and sinus pressure.   Eyes:  Negative for discharge.  Respiratory:  Negative for cough.   Cardiovascular:  Negative for chest pain.  Gastrointestinal:  Positive for abdominal pain. Negative for diarrhea.  Genitourinary:  Negative for frequency and hematuria.  Musculoskeletal:  Positive for back pain.  Skin:  Negative for rash.  Neurological:  Negative for seizures and headaches.  Psychiatric/Behavioral:  Negative for hallucinations.    Physical Exam Updated Vital Signs BP (!) 148/112   Pulse (!) 124   Temp (!) 100.8 F (38.2 C) (Rectal)   Resp 13   Ht 5\' 6"  (1.676 m)   Wt 53.5 kg   SpO2 95%   BMI 19.04 kg/m   Physical Exam Vitals and nursing note reviewed.  Constitutional:      Comments: Cachectic  HENT:     Head: Normocephalic.  Eyes:     General: No scleral  icterus.    Conjunctiva/sclera: Conjunctivae normal.  Neck:     Thyroid: No thyromegaly.  Cardiovascular:     Rate and Rhythm: Regular rhythm.     Heart sounds: No murmur heard.   No friction rub. No gallop.     Comments: Tachycardia Pulmonary:     Breath sounds: No stridor. No wheezing or rales.  Chest:     Chest wall: No tenderness.  Abdominal:     General: There is no distension.     Tenderness: There is no abdominal tenderness. There is no rebound.     Comments: Minimal suprapubic tenderness  Musculoskeletal:     Cervical back:  Neck supple.     Comments: Contractures in his arms  Lymphadenopathy:     Cervical: No cervical adenopathy.  Skin:    Findings: No erythema or rash.  Neurological:     Mental Status: He is alert.     Motor: No abnormal muscle tone.     Coordination: Coordination normal.  Psychiatric:        Behavior: Behavior normal.    ED Results / Procedures / Treatments   Labs (all labs ordered are listed, but only abnormal results are displayed) Labs Reviewed  COMPREHENSIVE METABOLIC PANEL - Abnormal; Notable for the following components:      Result Value   Potassium 3.3 (*)    Glucose, Bld 106 (*)    All other components within normal limits  CBC WITH DIFFERENTIAL/PLATELET - Abnormal; Notable for the following components:   WBC 17.0 (*)    Neutro Abs 14.6 (*)    Monocytes Absolute 1.4 (*)    Abs Immature Granulocytes 0.08 (*)    All other components within normal limits  URINALYSIS, ROUTINE W REFLEX MICROSCOPIC - Abnormal; Notable for the following components:   APPearance HAZY (*)    Glucose, UA 50 (*)    Hgb urine dipstick MODERATE (*)    Leukocytes,Ua LARGE (*)    WBC, UA >50 (*)    Bacteria, UA RARE (*)    All other components within normal limits  RESP PANEL BY RT-PCR (FLU A&B, COVID) ARPGX2  CULTURE, BLOOD (ROUTINE X 2)  CULTURE, BLOOD (ROUTINE X 2) W REFLEX TO ID PANEL  URINE CULTURE  LACTIC ACID, PLASMA  PROTIME-INR  APTT  LACTIC  ACID, PLASMA    EKG None  Radiology DG Chest Port 1 View  Result Date: 12/06/2020 CLINICAL DATA:  Abdominal pain and back pain.  Possible sepsis. EXAM: PORTABLE CHEST 1 VIEW COMPARISON:  09/12/2020 FINDINGS: Partially visualized thoracolumbar fixation hardware. Stable shunt catheters. Stable cardiomediastinal contours. Slightly low lung volumes. No focal airspace consolidation, pleural effusion, or pneumothorax. IMPRESSION: No acute cardiopulmonary disease. Electronically Signed   By: Duanne Guess D.O.   On: 12/06/2020 16:11    Procedures Procedures   Medications Ordered in ED Medications  aztreonam (AZACTAM) 2 g in sodium chloride 0.9 % 100 mL IVPB (has no administration in time range)  aztreonam (AZACTAM) 2 g in sodium chloride 0.9 % 100 mL IVPB (0 g Intravenous Stopped 12/06/20 1745)  sodium chloride 0.9 % bolus 2,000 mL (0 mLs Intravenous Stopped 12/06/20 1757)  acetaminophen (TYLENOL) tablet 650 mg (650 mg Oral Given 12/06/20 1658)    ED Course  I have reviewed the triage vital signs and the nursing notes.  Pertinent labs & imaging results that were available during my care of the patient were reviewed by me and considered in my medical decision making (see chart for details). CRITICAL CARE Performed by: Bethann Berkshire Total critical care time: 40 minutes Critical care time was exclusive of separately billable procedures and treating other patients. Critical care was necessary to treat or prevent imminent or life-threatening deterioration. Critical care was time spent personally by me on the following activities: development of treatment plan with patient and/or surrogate as well as nursing, discussions with consultants, evaluation of patient's response to treatment, examination of patient, obtaining history from patient or surrogate, ordering and performing treatments and interventions, ordering and review of laboratory studies, ordering and review of radiographic studies,  pulse oximetry and re-evaluation of patient's condition.    MDM Rules/Calculators/A&P  Patient with urinary tract infection causing tachycardia and fever and elevated white count.  He will be admitted for IV antibiotics Final Clinical Impression(s) / ED Diagnoses Final diagnoses:  Acute cystitis without hematuria    Rx / DC Orders ED Discharge Orders     None        Milton Ferguson, MD 12/06/20 1845

## 2020-12-06 NOTE — ED Notes (Signed)
Mother does not want pt to have a foley cath. Pt cooperates well for rectal temp.

## 2020-12-06 NOTE — ED Notes (Signed)
Pt takes med crushed with honey thick liquids.

## 2020-12-07 ENCOUNTER — Encounter (HOSPITAL_COMMUNITY): Payer: Self-pay | Admitting: Family Medicine

## 2020-12-07 DIAGNOSIS — A419 Sepsis, unspecified organism: Secondary | ICD-10-CM

## 2020-12-07 DIAGNOSIS — N390 Urinary tract infection, site not specified: Principal | ICD-10-CM

## 2020-12-07 DIAGNOSIS — G800 Spastic quadriplegic cerebral palsy: Secondary | ICD-10-CM

## 2020-12-07 DIAGNOSIS — G919 Hydrocephalus, unspecified: Secondary | ICD-10-CM

## 2020-12-07 DIAGNOSIS — I471 Supraventricular tachycardia: Secondary | ICD-10-CM

## 2020-12-07 LAB — CBC
HCT: 46.5 % (ref 39.0–52.0)
Hemoglobin: 15.8 g/dL (ref 13.0–17.0)
MCH: 31.9 pg (ref 26.0–34.0)
MCHC: 34 g/dL (ref 30.0–36.0)
MCV: 93.9 fL (ref 80.0–100.0)
Platelets: 174 10*3/uL (ref 150–400)
RBC: 4.95 MIL/uL (ref 4.22–5.81)
RDW: 13.8 % (ref 11.5–15.5)
WBC: 16.5 10*3/uL — ABNORMAL HIGH (ref 4.0–10.5)
nRBC: 0 % (ref 0.0–0.2)

## 2020-12-07 LAB — BASIC METABOLIC PANEL
Anion gap: 6 (ref 5–15)
BUN: 11 mg/dL (ref 6–20)
CO2: 25 mmol/L (ref 22–32)
Calcium: 8.8 mg/dL — ABNORMAL LOW (ref 8.9–10.3)
Chloride: 108 mmol/L (ref 98–111)
Creatinine, Ser: 0.56 mg/dL — ABNORMAL LOW (ref 0.61–1.24)
GFR, Estimated: >60 mL/min (ref >60)
Glucose, Bld: 94 mg/dL (ref 70–99)
Potassium: 3.5 mmol/L (ref 3.5–5.1)
Sodium: 139 mmol/L (ref 135–145)

## 2020-12-07 LAB — URINE CULTURE

## 2020-12-07 LAB — HIV ANTIBODY (ROUTINE TESTING W REFLEX): HIV Screen 4th Generation wRfx: NONREACTIVE

## 2020-12-07 LAB — MRSA NEXT GEN BY PCR, NASAL: MRSA by PCR Next Gen: NOT DETECTED

## 2020-12-07 MED ORDER — LORAZEPAM 2 MG/ML IJ SOLN: 1.0000 mg | Freq: Four times a day (QID) | INTRAMUSCULAR | Status: DC | PRN

## 2020-12-07 MED ORDER — SODIUM CHLORIDE 0.9 % IV SOLN
2.0000 g | INTRAVENOUS | Status: DC
  Administered 2020-12-08 – 2020-12-11 (×4): 2 g via INTRAVENOUS
  Filled 2020-12-07 (×4): qty 20

## 2020-12-07 MED ORDER — CHLORHEXIDINE GLUCONATE CLOTH 2 % EX PADS
6.0000 | MEDICATED_PAD | Freq: Every day | CUTANEOUS | Status: DC
  Administered 2020-12-07 – 2020-12-11 (×6): 6 via TOPICAL

## 2020-12-07 MED ORDER — SODIUM CHLORIDE 0.9 % IV SOLN: 200.0000 mg | Freq: Once | INTRAVENOUS | Status: DC

## 2020-12-07 MED ORDER — SODIUM CHLORIDE 0.9 % IV SOLN
200.0000 mg | Freq: Once | INTRAVENOUS | Status: AC
  Administered 2020-12-07: 200 mg via INTRAVENOUS
  Filled 2020-12-07: qty 20

## 2020-12-07 MED ORDER — SODIUM CHLORIDE 0.9 % IV SOLN: INTRAVENOUS | Status: DC

## 2020-12-07 MED ORDER — LACTULOSE 10 GM/15ML PO SOLN
30.0000 g | Freq: Once | ORAL | Status: AC
  Administered 2020-12-07: 30 g via ORAL
  Filled 2020-12-07: qty 60

## 2020-12-07 MED ORDER — DILTIAZEM HCL 25 MG/5ML IV SOLN
INTRAVENOUS | Status: AC
  Filled 2020-12-07: qty 5

## 2020-12-07 MED ORDER — LACOSAMIDE 200 MG/20ML IV SOLN
INTRAVENOUS | Status: AC
  Filled 2020-12-07: qty 20

## 2020-12-07 MED ORDER — SODIUM CHLORIDE 0.9 % IV BOLUS
1000.0000 mL | Freq: Once | INTRAVENOUS | Status: AC
  Administered 2020-12-07: 1000 mL via INTRAVENOUS

## 2020-12-07 MED ORDER — DILTIAZEM HCL ER 90 MG PO CP12
90.0000 mg | ORAL_CAPSULE | Freq: Two times a day (BID) | ORAL | Status: DC
  Administered 2020-12-07 – 2020-12-11 (×7): 90 mg via ORAL
  Filled 2020-12-07 (×14): qty 1

## 2020-12-07 MED ORDER — BISACODYL 10 MG RE SUPP
10.0000 mg | Freq: Every day | RECTAL | Status: DC
  Administered 2020-12-07 – 2020-12-11 (×4): 10 mg via RECTAL
  Filled 2020-12-07 (×5): qty 1

## 2020-12-07 MED ORDER — ORAL CARE MOUTH RINSE
15.0000 mL | Freq: Two times a day (BID) | OROMUCOSAL | Status: DC
  Administered 2020-12-07 – 2020-12-11 (×9): 15 mL via OROMUCOSAL

## 2020-12-07 MED ORDER — POLYETHYLENE GLYCOL 3350 17 G PO PACK
17.0000 g | PACK | Freq: Two times a day (BID) | ORAL | Status: DC
  Administered 2020-12-07 – 2020-12-11 (×8): 17 g via ORAL
  Filled 2020-12-07 (×7): qty 1

## 2020-12-07 NOTE — Progress Notes (Signed)
PROGRESS NOTE   Joshua Logan, is a 40 y.o. male, DOB - May 08, 1980, [id number]  Admit date - 12/06/2020   Admitting Physician Levie Heritage, DO  Outpatient Primary MD for the patient is Sasser, Clarene Critchley, MD  LOS - 1  Chief Complaint  Patient presents with   Abdominal Pain   Back Pain    Lower abdominal and lower back pain. Pt thinks that it may be a kidney stone.       Brief Narrative:  40 y.o. male with a history of spastic quadriplegia due to  cerebral palsy, hydrocephalus with VP shunt/revised to AP Shunt, with seizures GERD, chronic protein calorie malnutrition, chronic tachycardia on cardizem admitted on 08/05/2020 with sepsis secondary to presumed UTI  Assessment & Plan:   Principal Problem:   Sepsis (HCC) Active Problems:   PSVT (paroxysmal supraventricular tachycardia) (HCC)   Cerebral palsy (HCC)   Hydrocephalus (HCC)   Protein calorie malnutrition (HCC)   GERD (gastroesophageal reflux disease)   Acute lower UTI   1)Sepsis due to Presumed UTI----POA -With fevers, tachycardia, tachypnea and leukocytosis -IV fluids as ordered -Lactic acid noted -IV Rocephin pending culture data =-Cooling Blanket for persistent fevers  2)Cerebral Palsy with spastic quadriplegia and hydrocephalus -Status post VP shunt revised to AP Shunt--  3)Chronic tachycardia/paroxysmal SVTs--- increase Cardizem SR  to 90 mg twice daily  4)GERD-stable continue Protonix and Carafate  5)History of seizures----resume PTA Vimpat, lorazepam as needed seizures  6)Social/Ethics--plan of care discussed with patient's mom at bedside, patient is a full code  Disposition/Need for in-Hospital Stay- patient unable to be discharged at this time due to sepsis secondary to presumed UTI requiring IV fluids and IV antibiotics pending further culture data -Anticipate discharge home in 2 to 3 days pending culture data and clinical improvement  Status is: Inpatient  Remains inpatient appropriate  because: See disposition above  Disposition: The patient is from: Home              Anticipated d/c is to: Home              Anticipated d/c date is: 2 days              Patient currently is not medically stable to d/c. Barriers: Not Clinically Stable-   Code Status :  -  Code Status: Full Code   Family Communication:   Discussed with mother at bedside  Consults  :  na  DVT Prophylaxis  :   - SCDs   enoxaparin (LOVENOX) injection 40 mg Start: 12/06/20 2130   Lab Results  Component Value Date   PLT 174 12/07/2020    Inpatient Medications  Scheduled Meds:  bisacodyl  10 mg Rectal Daily   Chlorhexidine Gluconate Cloth  6 each Topical Daily   cycloSPORINE  1 drop Both Eyes BID   diltiazem  90 mg Oral BID   diltiazem       enoxaparin (LOVENOX) injection  40 mg Subcutaneous Q24H   lacosamide  150 mg Oral q morning   lacosamide  200 mg Oral QHS   linaclotide  145 mcg Oral Daily   mouth rinse  15 mL Mouth Rinse BID   pantoprazole  40 mg Oral Daily   polyethylene glycol  17 g Oral BID   sodium chloride HYPERTONIC  4 mL Nebulization BID   sucralfate  0.5 g Oral BID   tamsulosin  0.4 mg Oral Daily   Continuous Infusions:  [START ON 12/08/2020] cefTRIAXone (ROCEPHIN)  IV  lactated ringers 100 mL/hr at 12/07/20 0847   PRN Meds:.acetaminophen **OR** acetaminophen, LORazepam, ondansetron **OR** ondansetron (ZOFRAN) IV   Anti-infectives (From admission, onward)    Start     Dose/Rate Route Frequency Ordered Stop   12/08/20 0100  cefTRIAXone (ROCEPHIN) 2 g in sodium chloride 0.9 % 100 mL IVPB        2 g 200 mL/hr over 30 Minutes Intravenous Every 24 hours 12/07/20 1121     12/07/20 0200  aztreonam (AZACTAM) 2 g in sodium chloride 0.9 % 100 mL IVPB  Status:  Discontinued        2 g 200 mL/hr over 30 Minutes Intravenous Every 8 hours 12/06/20 1821 12/06/20 1906   12/07/20 0100  cefTRIAXone (ROCEPHIN) 1 g in sodium chloride 0.9 % 100 mL IVPB  Status:  Discontinued        1  g 200 mL/hr over 30 Minutes Intravenous Every 24 hours 12/06/20 1906 12/07/20 1121   12/06/20 1530  aztreonam (AZACTAM) 2 g in sodium chloride 0.9 % 100 mL IVPB        2 g 200 mL/hr over 30 Minutes Intravenous  Once 12/06/20 1528 12/06/20 1745        Subjective: Joshua Logan today has no emesis,  No chest pain,    No Nausea, Vomiting or Diarrhea -- Fevers persist  Objective: Vitals:   12/07/20 1400 12/07/20 1500 12/07/20 1525 12/07/20 1540  BP: (!) 150/105 (!) 154/86 (!) 162/90   Pulse: (!) 112 (!) 128 (!) 134 (!) 124  Resp: 15 (!) 24 (!) 23 (!) 33  Temp:   (!) 100.4 F (38 C)   TempSrc:   Rectal   SpO2: 98% 96% 96% 98%  Weight:      Height:       No intake or output data in the 24 hours ending 12/07/20 1559 Filed Weights   12/06/20 1510  Weight: 53.5 kg    Physical Exam  Gen:- Awake Alert, in no acute distress Neck-Supple Neck,No JVD,. VP/AP/Shunt Lungs-  CTAB , fair symmetrical air movement CV- S1, S2 normal, regular , tachycardic Abd-  +ve B.Sounds, Abd Soft, No tenderness,    Extremity/Skin:- No  edema, pedal pulses present  NeuroPsych--chronic neuromuscular deficits/deformities  due to cerebral palsy with spastic quadriplegia  Data Reviewed: I have personally reviewed following labs and imaging studies  CBC: Recent Labs  Lab 12/06/20 1644 12/07/20 0533  WBC 17.0* 16.5*  NEUTROABS 14.6*  --   HGB 16.1 15.8  HCT 47.9 46.5  MCV 94.5 93.9  PLT 211 [id number]   Basic Metabolic Panel: Recent Labs  Lab 12/06/20 1644 12/07/20 0533  NA 139 139  K 3.3* 3.5  CL 102 108  CO2 28 25  GLUCOSE 106* 94  BUN 13 11  CREATININE 0.72 0.56*  CALCIUM 9.4 8.8*   GFR: Estimated Creatinine Clearance: 93.8 mL/min (A) (by C-G formula based on SCr of 0.56 mg/dL (L)). Liver Function Tests: Recent Labs  Lab 12/06/20 1644  AST 16  ALT 15  ALKPHOS 112  BILITOT 0.6  PROT 7.3  ALBUMIN 4.1   No results for input(s): LIPASE, AMYLASE in the last 168 hours. No results for  input(s): AMMONIA in the last 168 hours. Coagulation Profile: Recent Labs  Lab 12/06/20 1644  INR 1.0   Cardiac Enzymes: No results for input(s): CKTOTAL, CKMB, CKMBINDEX, TROPONINI in the last 168 hours. BNP (last 3 results) No results for input(s): PROBNP in the last 8760 hours. HbA1C: No results  for input(s): HGBA1C in the last 72 hours. CBG: No results for input(s): GLUCAP in the last 168 hours. Lipid Profile: No results for input(s): CHOL, HDL, LDLCALC, TRIG, CHOLHDL, LDLDIRECT in the last 72 hours. Thyroid Function Tests: No results for input(s): TSH, T4TOTAL, FREET4, T3FREE, THYROIDAB in the last 72 hours. Anemia Panel: No results for input(s): VITAMINB12, FOLATE, FERRITIN, TIBC, IRON, RETICCTPCT in the last 72 hours. Urine analysis:    Component Value Date/Time   COLORURINE YELLOW 12/06/2020 1745   APPEARANCEUR HAZY (A) 12/06/2020 1745   LABSPEC 1.008 12/06/2020 1745   PHURINE 7.0 12/06/2020 1745   GLUCOSEU 50 (A) 12/06/2020 1745   HGBUR MODERATE (A) 12/06/2020 1745   BILIRUBINUR NEGATIVE 12/06/2020 [place] 12/06/2020 1745   PROTEINUR NEGATIVE 12/06/2020 1745   NITRITE NEGATIVE 12/06/2020 1745   LEUKOCYTESUR LARGE (A) 12/06/2020 1745   Sepsis Labs: @LABRCNTIP (procalcitonin:4,lacticidven:4)  ) Recent Results (from the past 240 hour(s))  Blood Culture (routine x 2)     Status: None (Preliminary result)   Collection Time: 12/06/20  3:32 PM   Specimen: BLOOD LEFT ARM  Result Value Ref Range Status   Specimen Description BLOOD LEFT ARM BOTTLES DRAWN AEROBIC AND ANAEROBIC  Final   Special Requests Blood Culture adequate volume  Final   Culture   Final    NO GROWTH < 24 HOURS Performed at [facility], [address], [place], [place] [address]    Report Status PENDING  Incomplete  Resp Panel by RT-PCR (Flu A&B, Covid) Nasopharyngeal Swab     Status: None   Collection Time: 12/06/20  4:10 PM   Specimen: Nasopharyngeal Swab; Nasopharyngeal(NP)  swabs in vial transport medium  Result Value Ref Range Status   SARS Coronavirus 2 by RT PCR NEGATIVE NEGATIVE Final    Comment: (NOTE) SARS-CoV-2 target nucleic acids are NOT DETECTED.  The SARS-CoV-2 RNA is generally detectable in upper respiratory specimens during the acute phase of infection. The lowest concentration of SARS-CoV-2 viral copies this assay can detect is 138 copies/mL. A negative result does not preclude SARS-Cov-2 infection and should not be used as the sole basis for treatment or other patient management decisions. A negative result may occur with  improper specimen collection/handling, submission of specimen other than nasopharyngeal swab, presence of viral mutation(s) within the areas targeted by this assay, and inadequate number of viral copies(<138 copies/mL). A negative result must be combined with clinical observations, patient history, and epidemiological information. The expected result is Negative.  Fact Sheet for Patients:  EntrepreneurPulse.com.au  Fact Sheet for Healthcare Providers:  IncredibleEmployment.be  This test is no t yet approved or cleared by the [place] FDA and  has been authorized for detection and/or diagnosis of SARS-CoV-2 by FDA under an Emergency Use Authorization (EUA). This EUA will remain  in effect (meaning this test can be used) for the duration of the COVID-19 declaration under Section 564(b)(1) of the Act, 21 U.S.C.section 360bbb-3(b)(1), unless the authorization is terminated  or revoked sooner.       Influenza A by PCR NEGATIVE NEGATIVE Final   Influenza B by PCR NEGATIVE NEGATIVE Final    Comment: (NOTE) The Xpert Xpress SARS-CoV-2/FLU/RSV plus assay is intended as an aid in the diagnosis of influenza from Nasopharyngeal swab specimens and should not be used as a sole basis for treatment. Nasal washings and aspirates are unacceptable for Xpert Xpress  SARS-CoV-2/FLU/RSV testing.  Fact Sheet for Patients: EntrepreneurPulse.com.au  Fact Sheet for Healthcare Providers: IncredibleEmployment.be  This test  is not yet approved or cleared by the [place] and has been authorized for detection and/or diagnosis of SARS-CoV-2 by FDA under an Emergency Use Authorization (EUA). This EUA will remain in effect (meaning this test can be used) for the duration of the COVID-19 declaration under Section 564(b)(1) of the Act, 21 U.S.C. section 360bbb-3(b)(1), unless the authorization is terminated or revoked.  Performed at [facility], [address], [place], [place] [address]   Culture, blood (Routine X 2) w Reflex to ID Panel     Status: None (Preliminary result)   Collection Time: 12/06/20  5:17 PM   Specimen: BLOOD LEFT HAND  Result Value Ref Range Status   Specimen Description   Final    BLOOD LEFT HAND BOTTLES DRAWN AEROBIC AND ANAEROBIC   Special Requests Blood Culture adequate volume  Final   Culture   Final    NO GROWTH < 24 HOURS Performed at [facility], [address] [place], [place] [address]    Report Status PENDING  Incomplete      Radiology Studies: DG Chest Port 1 View  Result Date: 12/06/2020 CLINICAL DATA:  Abdominal pain and back pain.  Possible sepsis. EXAM: PORTABLE CHEST 1 VIEW COMPARISON:  09/12/2020 FINDINGS: Partially visualized thoracolumbar fixation hardware. Stable shunt catheters. Stable cardiomediastinal contours. Slightly low lung volumes. No focal airspace consolidation, pleural effusion, or pneumothorax. IMPRESSION: No acute cardiopulmonary disease. Electronically Signed   By: Duanne Guess D.O.   On: 12/06/2020 16:11   CT Renal Stone Study  Result Date: 12/06/2020 CLINICAL DATA:  Lower abdominal back pain, fever EXAM: CT ABDOMEN AND PELVIS WITHOUT CONTRAST TECHNIQUE: Multidetector CT imaging of the abdomen and pelvis was performed following the standard  protocol without IV contrast. Unenhanced CT was performed per clinician order. Lack of IV contrast limits sensitivity and specificity, especially for evaluation of abdominal/pelvic solid viscera. COMPARISON:  None. FINDINGS: Lower chest: Minimal hypoventilatory changes at the lung bases. No acute airspace disease. Hepatobiliary: No focal liver abnormality is seen. No gallstones, gallbladder wall thickening, or biliary dilatation. Pancreas: Unremarkable. No pancreatic ductal dilatation or surrounding inflammatory changes. Spleen: Normal in size without focal abnormality. Adrenals/Urinary Tract: The left kidney is surgically absent. Right kidney is unremarkable without nephrolithiasis or hydronephrosis. Bladder is moderately distended without focal abnormality. Adrenals are grossly normal. Stomach/Bowel: No bowel obstruction or ileus. Significant retained stool throughout the colon compatible constipation. Normal appendix right lower quadrant. No bowel wall thickening or inflammatory change. Vascular/Lymphatic: No significant vascular findings are present. No enlarged abdominal or pelvic lymph nodes. Reproductive: Moderate enlargement the prostate measuring up to 5.8 x 3.6 cm in transverse dimension. Other: No free fluid or free gas. No abdominal wall hernia. Implanted pump within the left lower quadrant anterior abdominal wall, with catheter tubing extending toward the central canal. The superior extent of the tubing is excluded by slice selection. Musculoskeletal: Postsurgical changes are seen from thoracolumbar fusion. There are no acute displaced fractures. Congenital deformities of the bilateral hips, with postsurgical changes of the proximal right femur. Reconstructed images demonstrate no additional findings. IMPRESSION: 1. No evidence of urinary tract calculi or obstructive uropathy. Patient is status post left nephrectomy. 2. Significant retained stool throughout the colon consistent with constipation. No  bowel obstruction or ileus. 3. Enlarged prostate. Electronically Signed   By: Sharlet Salina M.D.   On: 12/06/2020 18:56     Scheduled Meds:  bisacodyl  10 mg Rectal Daily   Chlorhexidine Gluconate Cloth  6 each Topical Daily  cycloSPORINE  1 drop Both Eyes BID   diltiazem  90 mg Oral BID   diltiazem       enoxaparin (LOVENOX) injection  40 mg Subcutaneous Q24H   lacosamide  150 mg Oral q morning   lacosamide  200 mg Oral QHS   linaclotide  145 mcg Oral Daily   mouth rinse  15 mL Mouth Rinse BID   pantoprazole  40 mg Oral Daily   polyethylene glycol  17 g Oral BID   sodium chloride HYPERTONIC  4 mL Nebulization BID   sucralfate  0.5 g Oral BID   tamsulosin  0.4 mg Oral Daily   Continuous Infusions:  [START ON 12/08/2020] cefTRIAXone (ROCEPHIN)  IV     lactated ringers 100 mL/hr at 12/07/20 0847     LOS: 1 day   Roxan Hockey M.D on 12/07/2020 at 3:59 PM  Go to [email or web address] - for contact info  [facility] - Office  [phone]  If 7PM-7AM, please contact night-coverage [email or web address] Password [facility] 12/07/2020, 3:59 PM

## 2020-12-07 NOTE — ED Notes (Signed)
Pt placed on air mattress, set to alternate every 5 mins per the pt mothers request.

## 2020-12-08 ENCOUNTER — Inpatient Hospital Stay (HOSPITAL_COMMUNITY): Payer: Medicare PPO

## 2020-12-08 DIAGNOSIS — E440 Moderate protein-calorie malnutrition: Secondary | ICD-10-CM

## 2020-12-08 MED ORDER — NOREPINEPHRINE 4 MG/250ML-% IV SOLN
2.0000 ug/min | INTRAVENOUS | Status: DC
  Administered 2020-12-08: 2 ug/min via INTRAVENOUS
  Filled 2020-12-08: qty 250

## 2020-12-08 MED ORDER — FUROSEMIDE 10 MG/ML IJ SOLN
20.0000 mg | Freq: Once | INTRAMUSCULAR | Status: AC
  Administered 2020-12-08: 20 mg via INTRAVENOUS
  Filled 2020-12-08: qty 2

## 2020-12-08 MED ORDER — SODIUM CHLORIDE 0.9 % IV SOLN: 250.0000 mL | INTRAVENOUS | Status: DC

## 2020-12-08 NOTE — Progress Notes (Signed)
PROGRESS NOTE   Joshua Logan, is a 40 y.o. male, DOB - 1980/06/04, HKV:[id number]  Admit date - 12/06/2020   Admitting Physician No admitting provider for patient encounter.  Outpatient Primary MD for the patient is Sasser, Clarene Critchley, MD  LOS - 2  Chief Complaint  Patient presents with   Abdominal Pain   Back Pain    Lower abdominal and lower back pain. Pt thinks that it may be a kidney stone.       Brief Narrative:  40 y.o. male with a history of spastic quadriplegia due to  cerebral palsy, hydrocephalus with VP shunt/revised to AP Shunt, with seizures GERD, chronic protein calorie malnutrition, chronic tachycardia on cardizem admitted on 08/05/2020 with sepsis secondary to presumed UTI  Assessment & Plan:   Principal Problem:   Sepsis (HCC) Active Problems:   PSVT (paroxysmal supraventricular tachycardia) (HCC)   Cerebral palsy (HCC)   Hydrocephalus (HCC)   Protein calorie malnutrition (HCC)   GERD (gastroesophageal reflux disease)   Acute lower UTI   1)Sepsis due to Presumed UTI----POA -With fevers, tachycardia, tachypnea and leukocytosis Required IV Levophed overnight for transient hypotension BP is improved -Patient is now off IV Levophed -Lactic acid noted -IV Rocephin pending culture data -=-Cooling Blanket for persistent fevers--fever curve is trending down -Urine culture appears contaminated will repeat urine culture, -Blood culture NGTD  2)Cerebral Palsy with spastic quadriplegia and hydrocephalus -Status post VP shunt revised to AP Shunt-- -Supportive care  3)Chronic tachycardia/paroxysmal SVTs--- increase Cardizem SR  to 90 mg twice daily -Tachycardia worsened with fevers and sepsis  4)GERD-stable continue Protonix and Carafate  5)History of seizures---- continue PTA Vimpat, lorazepam as needed seizures  6)Social/Ethics--plan of care discussed with patient's mom at bedside, patient is a full code  7) acute respiratory failure/Ho OSA----currently  requiring 3 L of oxygen via nasal cannula -Chest x-ray suggestive of fluid overload/CHF --We will stop IV fluids and try iv Lasix -PTA patient used BiPAP for OSA but was not on home O2   Disposition/Need for in-Hospital Stay- patient unable to be discharged at this time due to sepsis secondary to presumed UTI requiring IV fluids and IV antibiotics pending further culture data -Anticipate discharge home in 2 to 3 days pending culture data and clinical improvement  Status is: Inpatient  Remains inpatient appropriate because: See disposition above  Disposition: The patient is from: Home              Anticipated d/c is to: Home              Anticipated d/c date is: 2 days              Patient currently is not medically stable to d/c. Barriers: Not Clinically Stable-   Code Status :  -  Code Status: Full Code   Family Communication:   Discussed with mother at bedside  Consults  :  na  DVT Prophylaxis  :   - SCDs   enoxaparin (LOVENOX) injection 40 mg Start: 12/06/20 2130   Lab Results  Component Value Date   PLT 174 12/07/2020    Inpatient Medications  Scheduled Meds:  bisacodyl  10 mg Rectal Daily   Chlorhexidine Gluconate Cloth  6 each Topical Daily   cycloSPORINE  1 drop Both Eyes BID   diltiazem  90 mg Oral BID   enoxaparin (LOVENOX) injection  40 mg Subcutaneous Q24H   lacosamide  150 mg Oral q morning   lacosamide  200 mg Oral QHS  linaclotide  145 mcg Oral Daily   mouth rinse  15 mL Mouth Rinse BID   pantoprazole  40 mg Oral Daily   polyethylene glycol  17 g Oral BID   sodium chloride HYPERTONIC  4 mL Nebulization BID   sucralfate  0.5 g Oral BID   tamsulosin  0.4 mg Oral Daily   Continuous Infusions:  sodium chloride 150 mL/hr at 12/08/20 1551   sodium chloride     cefTRIAXone (ROCEPHIN)  IV Stopped (12/08/20 0813)   lactated ringers Stopped (12/07/20 1157)   norepinephrine (LEVOPHED) Adult infusion Stopped (12/08/20 0610)   PRN Meds:.acetaminophen **OR**  acetaminophen, LORazepam, ondansetron **OR** ondansetron (ZOFRAN) IV   Anti-infectives (From admission, onward)    Start     Dose/Rate Route Frequency Ordered Stop   12/08/20 0100  cefTRIAXone (ROCEPHIN) 2 g in sodium chloride 0.9 % 100 mL IVPB        2 g 200 mL/hr over 30 Minutes Intravenous Every 24 hours 12/07/20 1121     12/07/20 0200  aztreonam (AZACTAM) 2 g in sodium chloride 0.9 % 100 mL IVPB  Status:  Discontinued        2 g 200 mL/hr over 30 Minutes Intravenous Every 8 hours 12/06/20 1821 12/06/20 1906   12/07/20 0100  cefTRIAXone (ROCEPHIN) 1 g in sodium chloride 0.9 % 100 mL IVPB  Status:  Discontinued        1 g 200 mL/hr over 30 Minutes Intravenous Every 24 hours 12/06/20 1906 12/07/20 1121   12/06/20 1530  aztreonam (AZACTAM) 2 g in sodium chloride 0.9 % 100 mL IVPB        2 g 200 mL/hr over 30 Minutes Intravenous  Once 12/06/20 1528 12/06/20 1745        Subjective: Joshua Logan today has no emesis,  No chest pain,    Patient's mother and father at bedside, -Oral intake is fair -Fever curve is trending down   Objective: Vitals:   12/08/20 0900 12/08/20 0915 12/08/20 1037 12/08/20 1515  BP: (!) 170/95     Pulse: (!) 110     Resp: 17     Temp:  99.1 F (37.3 C) 99.3 F (37.4 C) 99.4 F (37.4 C)  TempSrc:  Rectal Rectal Rectal  SpO2: 96%     Weight:      Height:        Intake/Output Summary (Last 24 hours) at 12/08/2020 1826 Last data filed at 12/08/2020 1551 Gross per 24 hour  Intake 3494.7 ml  Output 1225 ml  Net 2269.7 ml   Filed Weights   12/06/20 1510  Weight: 53.5 kg    Physical Exam  Gen:- Awake Alert, in no acute distress Neck-Supple Neck,No JVD,. VP/AP/Shunt Nose- [place] 3L/min Lungs-slightly diminished in bases, no wheezing no rhonchi CV- S1, S2 normal, regular , tachycardic Abd-  +ve B.Sounds, Abd Soft, No tenderness,    Extremity/Skin:- No  edema, pedal pulses present  NeuroPsych--chronic neuromuscular deficits/deformities  due to  cerebral palsy with spastic quadriplegia  Data Reviewed: I have personally reviewed following labs and imaging studies  CBC: Recent Labs  Lab 12/06/20 1644 12/07/20 0533  WBC 17.0* 16.5*  NEUTROABS 14.6*  --   HGB 16.1 15.8  HCT 47.9 46.5  MCV 94.5 93.9  PLT 211 [id number]   Basic Metabolic Panel: Recent Labs  Lab 12/06/20 1644 12/07/20 0533  NA 139 139  K 3.3* 3.5  CL 102 108  CO2 28 25  GLUCOSE 106* 94  BUN  13 11  CREATININE 0.72 0.56*  CALCIUM 9.4 8.8*   GFR: Estimated Creatinine Clearance: 93.8 mL/min (A) (by C-G formula based on SCr of 0.56 mg/dL (L)). Liver Function Tests: Recent Labs  Lab 12/06/20 1644  AST 16  ALT 15  ALKPHOS 112  BILITOT 0.6  PROT 7.3  ALBUMIN 4.1   No results for input(s): LIPASE, AMYLASE in the last 168 hours. No results for input(s): AMMONIA in the last 168 hours. Coagulation Profile: Recent Labs  Lab 12/06/20 1644  INR 1.0   Cardiac Enzymes: No results for input(s): CKTOTAL, CKMB, CKMBINDEX, TROPONINI in the last 168 hours. BNP (last 3 results) No results for input(s): PROBNP in the last 8760 hours. HbA1C: No results for input(s): HGBA1C in the last 72 hours. CBG: No results for input(s): GLUCAP in the last 168 hours. Lipid Profile: No results for input(s): CHOL, HDL, LDLCALC, TRIG, CHOLHDL, LDLDIRECT in the last 72 hours. Thyroid Function Tests: No results for input(s): TSH, T4TOTAL, FREET4, T3FREE, THYROIDAB in the last 72 hours. Anemia Panel: No results for input(s): VITAMINB12, FOLATE, FERRITIN, TIBC, IRON, RETICCTPCT in the last 72 hours. Urine analysis:    Component Value Date/Time   COLORURINE YELLOW 12/06/2020 1745   APPEARANCEUR HAZY (A) 12/06/2020 1745   LABSPEC 1.008 12/06/2020 1745   PHURINE 7.0 12/06/2020 1745   GLUCOSEU 50 (A) 12/06/2020 1745   HGBUR MODERATE (A) 12/06/2020 1745   BILIRUBINUR NEGATIVE 12/06/2020 [place] 12/06/2020 1745   PROTEINUR NEGATIVE 12/06/2020 1745   NITRITE  NEGATIVE 12/06/2020 1745   LEUKOCYTESUR LARGE (A) 12/06/2020 1745   Sepsis Labs: @LABRCNTIP (procalcitonin:4,lacticidven:4)  ) Recent Results (from the past 240 hour(s))  Blood Culture (routine x 2)     Status: None (Preliminary result)   Collection Time: 12/06/20  3:32 PM   Specimen: BLOOD LEFT ARM  Result Value Ref Range Status   Specimen Description BLOOD LEFT ARM BOTTLES DRAWN AEROBIC AND ANAEROBIC  Final   Special Requests Blood Culture adequate volume  Final   Culture   Final    NO GROWTH 2 DAYS Performed at [facility], [address], [place], [place] [address]    Report Status PENDING  Incomplete  Resp Panel by RT-PCR (Flu A&B, Covid) Nasopharyngeal Swab     Status: None   Collection Time: 12/06/20  4:10 PM   Specimen: Nasopharyngeal Swab; Nasopharyngeal(NP) swabs in vial transport medium  Result Value Ref Range Status   SARS Coronavirus 2 by RT PCR NEGATIVE NEGATIVE Final    Comment: (NOTE) SARS-CoV-2 target nucleic acids are NOT DETECTED.  The SARS-CoV-2 RNA is generally detectable in upper respiratory specimens during the acute phase of infection. The lowest concentration of SARS-CoV-2 viral copies this assay can detect is 138 copies/mL. A negative result does not preclude SARS-Cov-2 infection and should not be used as the sole basis for treatment or other patient management decisions. A negative result may occur with  improper specimen collection/handling, submission of specimen other than nasopharyngeal swab, presence of viral mutation(s) within the areas targeted by this assay, and inadequate number of viral copies(<138 copies/mL). A negative result must be combined with clinical observations, patient history, and epidemiological information. The expected result is Negative.  Fact Sheet for Patients:  EntrepreneurPulse.com.au  Fact Sheet for Healthcare Providers:  IncredibleEmployment.be  This test is no t yet approved or  cleared by the [place] FDA and  has been authorized for detection and/or diagnosis of SARS-CoV-2 by FDA under an Emergency Use Authorization (EUA).  This EUA will remain  in effect (meaning this test can be used) for the duration of the COVID-19 declaration under Section 564(b)(1) of the Act, 21 U.S.C.section 360bbb-3(b)(1), unless the authorization is terminated  or revoked sooner.       Influenza A by PCR NEGATIVE NEGATIVE Final   Influenza B by PCR NEGATIVE NEGATIVE Final    Comment: (NOTE) The Xpert Xpress SARS-CoV-2/FLU/RSV plus assay is intended as an aid in the diagnosis of influenza from Nasopharyngeal swab specimens and should not be used as a sole basis for treatment. Nasal washings and aspirates are unacceptable for Xpert Xpress SARS-CoV-2/FLU/RSV testing.  Fact Sheet for Patients: EntrepreneurPulse.com.au  Fact Sheet for Healthcare Providers: IncredibleEmployment.be  This test is not yet approved or cleared by the [place] FDA and has been authorized for detection and/or diagnosis of SARS-CoV-2 by FDA under an Emergency Use Authorization (EUA). This EUA will remain in effect (meaning this test can be used) for the duration of the COVID-19 declaration under Section 564(b)(1) of the Act, 21 U.S.C. section 360bbb-3(b)(1), unless the authorization is terminated or revoked.  Performed at [facility], [address] [place], [place] [address]   Culture, blood (Routine X 2) w Reflex to ID Panel     Status: None (Preliminary result)   Collection Time: 12/06/20  5:17 PM   Specimen: BLOOD LEFT HAND  Result Value Ref Range Status   Specimen Description   Final    BLOOD LEFT HAND BOTTLES DRAWN AEROBIC AND ANAEROBIC   Special Requests Blood Culture adequate volume  Final   Culture   Final    NO GROWTH 2 DAYS Performed at [facility], [address], [place], [place] [address]    Report Status PENDING  Incomplete  Urine  Culture     Status: Abnormal   Collection Time: 12/06/20  5:45 PM   Specimen: In/Out Cath Urine  Result Value Ref Range Status   Specimen Description   Final    IN/OUT CATH URINE Performed at [facility], [address], [place], [place] [address]    Special Requests   Final    NONE Performed at [facility], [address] [place], [place] [address]    Culture MULTIPLE SPECIES PRESENT, SUGGEST RECOLLECTION (A)  Final   Report Status 12/07/2020 FINAL  Final  MRSA Next Gen by PCR, Nasal     Status: None   Collection Time: 12/07/20 12:29 PM   Specimen: Nasal Mucosa; Nasal Swab  Result Value Ref Range Status   MRSA by PCR Next Gen NOT DETECTED NOT DETECTED Final    Comment: (NOTE) The GeneXpert MRSA Assay (FDA approved for NASAL specimens only), is one component of a comprehensive MRSA colonization surveillance program. It is not intended to diagnose MRSA infection nor to guide or monitor treatment for MRSA infections. Test performance is not FDA approved in patients less than 18 years old. Performed at [facility], [address] [place],  [address]       Radiology Studies: DG CHEST PORT 1 VIEW  Result Date: 12/08/2020 CLINICAL DATA:  Shortness of breath EXAM: PORTABLE CHEST 1 VIEW COMPARISON:  Previous studies including the examination of 09/12/2020 FINDINGS: Transverse diameter of heart is increased. Central pulmonary vessels are more prominent. There is poor inspiration. Increased interstitial markings are seen in the parahilar regions and lower lung fields. There is no focal consolidation. Right lateral CP angle is indistinct. There is no pneumothorax. IMPRESSION: Cardiomegaly. Central pulmonary vessels are more prominent with increased  interstitial markings in the parahilar regions and lower lung fields suggesting CHF. There is no focal pulmonary consolidation. Possible minimal right pleural effusion. Electronically Signed   By: Elmer Picker M.D.   On: 12/08/2020  09:23     Scheduled Meds:  bisacodyl  10 mg Rectal Daily   Chlorhexidine Gluconate Cloth  6 each Topical Daily   cycloSPORINE  1 drop Both Eyes BID   diltiazem  90 mg Oral BID   enoxaparin (LOVENOX) injection  40 mg Subcutaneous Q24H   lacosamide  150 mg Oral q morning   lacosamide  200 mg Oral QHS   linaclotide  145 mcg Oral Daily   mouth rinse  15 mL Mouth Rinse BID   pantoprazole  40 mg Oral Daily   polyethylene glycol  17 g Oral BID   sodium chloride HYPERTONIC  4 mL Nebulization BID   sucralfate  0.5 g Oral BID   tamsulosin  0.4 mg Oral Daily   Continuous Infusions:  sodium chloride 150 mL/hr at 12/08/20 1551   sodium chloride     cefTRIAXone (ROCEPHIN)  IV Stopped (12/08/20 0813)   lactated ringers Stopped (12/07/20 1157)   norepinephrine (LEVOPHED) Adult infusion Stopped (12/08/20 0610)     LOS: 2 days   Roxan Hockey M.D on 12/08/2020 at 6:26 PM  Go to [email or web address] - for contact info  [facility] - Office  [phone]  If 7PM-7AM, please contact night-coverage [email or web address] Password [facility] 12/08/2020, 6:26 PM

## 2020-12-09 LAB — RENAL FUNCTION PANEL
Albumin: 3.1 g/dL — ABNORMAL LOW (ref 3.5–5.0)
Anion gap: 12 (ref 5–15)
BUN: 11 mg/dL (ref 6–20)
CO2: 22 mmol/L (ref 22–32)
Calcium: 8.7 mg/dL — ABNORMAL LOW (ref 8.9–10.3)
Chloride: 107 mmol/L (ref 98–111)
Creatinine, Ser: 0.64 mg/dL (ref 0.61–1.24)
GFR, Estimated: >60 mL/min (ref >60)
Glucose, Bld: 77 mg/dL (ref 70–99)
Phosphorus: 1.8 mg/dL — ABNORMAL LOW (ref 2.5–4.6)
Potassium: 3.9 mmol/L (ref 3.5–5.1)
Sodium: 141 mmol/L (ref 135–145)

## 2020-12-09 LAB — CBC
HCT: 44.3 % (ref 39.0–52.0)
Hemoglobin: 15.4 g/dL (ref 13.0–17.0)
MCH: 32.7 pg (ref 26.0–34.0)
MCHC: 34.8 g/dL (ref 30.0–36.0)
MCV: 94.1 fL (ref 80.0–100.0)
Platelets: 174 10*3/uL (ref 150–400)
RBC: 4.71 MIL/uL (ref 4.22–5.81)
RDW: 13.9 % (ref 11.5–15.5)
WBC: 16.2 10*3/uL — ABNORMAL HIGH (ref 4.0–10.5)
nRBC: 0 % (ref 0.0–0.2)

## 2020-12-09 LAB — URINE CULTURE: Culture: NO GROWTH

## 2020-12-09 MED ORDER — SODIUM CHLORIDE 3 % IN NEBU
4.0000 mL | INHALATION_SOLUTION | Freq: Two times a day (BID) | RESPIRATORY_TRACT | Status: DC
  Administered 2020-12-09 – 2020-12-11 (×4): 4 mL via RESPIRATORY_TRACT
  Filled 2020-12-09 (×3): qty 4

## 2020-12-09 MED ORDER — FUROSEMIDE 10 MG/ML IJ SOLN
20.0000 mg | Freq: Two times a day (BID) | INTRAMUSCULAR | Status: DC
  Administered 2020-12-09 – 2020-12-11 (×4): 20 mg via INTRAVENOUS
  Filled 2020-12-09 (×4): qty 2

## 2020-12-09 MED ORDER — CARVEDILOL 3.125 MG PO TABS
6.2500 mg | ORAL_TABLET | Freq: Two times a day (BID) | ORAL | Status: DC
  Administered 2020-12-10: 6.25 mg via ORAL
  Filled 2020-12-09: qty 2

## 2020-12-09 MED ORDER — POTASSIUM PHOSPHATES 15 MMOLE/5ML IV SOLN
30.0000 mmol | Freq: Once | INTRAVENOUS | Status: AC
  Administered 2020-12-09: 30 mmol via INTRAVENOUS
  Filled 2020-12-09: qty 10

## 2020-12-09 NOTE — Plan of Care (Signed)

## 2020-12-09 NOTE — Progress Notes (Signed)
PROGRESS NOTE   Joshua Logan, is a 40 y.o. male, DOB - Apr 07, 1980, ZOX:[id number]  Admit date - 12/06/2020   Admitting Physician No admitting provider for patient encounter.  Outpatient Primary MD for the patient is Sasser, Clarene Critchley, MD  LOS - 3  Chief Complaint  Patient presents with   Abdominal Pain   Back Pain    Lower abdominal and lower back pain. Pt thinks that it may be a kidney stone.       Brief Narrative:  40 y.o. male with a history of spastic quadriplegia due to  cerebral palsy, hydrocephalus with VP shunt/revised to AP Shunt, with seizures GERD, chronic protein calorie malnutrition, chronic tachycardia on cardizem admitted on 08/05/2020 with sepsis secondary to presumed UTI  Assessment & Plan:   Principal Problem:   Sepsis (HCC) Active Problems:   PSVT (paroxysmal supraventricular tachycardia) (HCC)   Cerebral palsy (HCC)   Hydrocephalus (HCC)   Protein calorie malnutrition (HCC)   GERD (gastroesophageal reflux disease)   Acute lower UTI   1)Sepsis due to Presumed UTI----POA -Patient had fevers, tachycardia, tachypnea and leukocytosis -Fever curve is improving--required cooling blanket overnight  -WBC 16.2  -Patient is off IV Levophed -Lactic acid noted -c/n IV Rocephin pending culture data -=-Cooling Blanket for persistent fevers--fever curve is trending down -Urine culture appears contaminated, urine culture pending -Blood culture NGTD  2)Cerebral Palsy with spastic quadriplegia and hydrocephalus -Status post VP shunt revised to AP Shunt-- -Supportive care  3)Chronic tachycardia/paroxysmal SVTs--- increase Cardizem SR  to 90 mg twice daily -Tachycardia worsened with fevers and sepsis - 4)GERD-stable continue Protonix and Carafate  5)History of seizures---- continue PTA Vimpat, lorazepam as needed seizures  6)Social/Ethics--plan of care discussed with patient's mom and dad at bedside, patient is a full code  7) acute respiratory failure/Ho  OSA----currently requiring 3 L of oxygen via nasal cannula -Chest x-ray suggestive of fluid overload/CHF -Continue IV Lasix, -IV fluids discontinued -PTA patient used BiPAP for OSA but was not on home O2  8)HTN-elevated, Cardizem increased as above #3, add Coreg  may use IV Hydralazine 10 mg  Every 4 hours Prn for systolic blood pressure over 160 mmhg  Disposition/Need for in-Hospital Stay- patient unable to be discharged at this time due to sepsis secondary to presumed UTI requiring IV antibiotics pending further culture data -Anticipate discharge home in 2 to 3 days pending culture data and clinical improvement  Status is: Inpatient  Remains inpatient appropriate because: See disposition above  Disposition: The patient is from: Home              Anticipated d/c is to: Home              Anticipated d/c date is: 2 days              Patient currently is not medically stable to d/c. Barriers: Not Clinically Stable-   Code Status :  -  Code Status: Full Code   Family Communication:   Discussed with mother and father at bedside  Consults  :  na  DVT Prophylaxis  :   - SCDs   enoxaparin (LOVENOX) injection 40 mg Start: 12/06/20 2130   Lab Results  Component Value Date   PLT 174 12/09/2020    Inpatient Medications  Scheduled Meds:  bisacodyl  10 mg Rectal Daily   Chlorhexidine Gluconate Cloth  6 each Topical Daily   cycloSPORINE  1 drop Both Eyes BID   diltiazem  90 mg Oral BID  enoxaparin (LOVENOX) injection  40 mg Subcutaneous Q24H   furosemide  20 mg Intravenous Q12H   lacosamide  150 mg Oral q morning   lacosamide  200 mg Oral QHS   linaclotide  145 mcg Oral Daily   mouth rinse  15 mL Mouth Rinse BID   pantoprazole  40 mg Oral Daily   polyethylene glycol  17 g Oral BID   sodium chloride HYPERTONIC  4 mL Nebulization BID   sucralfate  0.5 g Oral BID   tamsulosin  0.4 mg Oral Daily   Continuous Infusions:  sodium chloride Stopped (12/09/20 1043)   sodium chloride      cefTRIAXone (ROCEPHIN)  IV 2 g (12/09/20 0133)   norepinephrine (LEVOPHED) Adult infusion Stopped (12/08/20 0610)   PRN Meds:.acetaminophen **OR** acetaminophen, LORazepam, ondansetron **OR** ondansetron (ZOFRAN) IV   Anti-infectives (From admission, onward)    Start     Dose/Rate Route Frequency Ordered Stop   12/08/20 0100  cefTRIAXone (ROCEPHIN) 2 g in sodium chloride 0.9 % 100 mL IVPB        2 g 200 mL/hr over 30 Minutes Intravenous Every 24 hours 12/07/20 1121     12/07/20 0200  aztreonam (AZACTAM) 2 g in sodium chloride 0.9 % 100 mL IVPB  Status:  Discontinued        2 g 200 mL/hr over 30 Minutes Intravenous Every 8 hours 12/06/20 1821 12/06/20 1906   12/07/20 0100  cefTRIAXone (ROCEPHIN) 1 g in sodium chloride 0.9 % 100 mL IVPB  Status:  Discontinued        1 g 200 mL/hr over 30 Minutes Intravenous Every 24 hours 12/06/20 1906 12/07/20 1121   12/06/20 1530  aztreonam (AZACTAM) 2 g in sodium chloride 0.9 % 100 mL IVPB        2 g 200 mL/hr over 30 Minutes Intravenous  Once 12/06/20 1528 12/06/20 1745        Subjective: Joshua Manthei today has no emesis,  No chest pain,    Oral intake is good, -Required cooling  overnight Elevated BP noted hypoxia persist -Patient's mother at bedside  Objective: Vitals:   12/09/20 0852 12/09/20 0854 12/09/20 1127 12/09/20 1603  BP:  (!) 184/96    Pulse:  (!) 105 (!) 114 (!) 106  Resp:  20 16 14   Temp:   99.1 F (37.3 C) 99.6 F (37.6 C)  TempSrc:   Rectal Rectal  SpO2: 95% 96% 92% 91%  Weight:      Height:        Intake/Output Summary (Last 24 hours) at 12/09/2020 1748 Last data filed at 12/09/2020 1147 Gross per 24 hour  Intake 981.55 ml  Output 2300 ml  Net -1318.45 ml   Filed Weights   12/06/20 1510  Weight: 53.5 kg    Physical Exam  Gen:- Awake Alert, in no acute distress Neck-Supple Neck,No JVD,. VP/AP/Shunt Nose- [place] 3L/min Lungs-slightly diminished in bases, no wheezing no rhonchi CV- S1, S2 normal,  regular , tachycardic Abd-  +ve B.Sounds, Abd Soft, No tenderness,    Extremity/Skin:- No  edema, pedal pulses present  NeuroPsych--chronic neuromuscular deficits/deformities  due to cerebral palsy with spastic quadriplegia  Data Reviewed: I have personally reviewed following labs and imaging studies  CBC: Recent Labs  Lab 12/06/20 1644 12/07/20 0533 12/09/20 0419  WBC 17.0* 16.5* 16.2*  NEUTROABS 14.6*  --   --   HGB 16.1 15.8 15.4  HCT 47.9 46.5 44.3  MCV 94.5 93.9 94.1  PLT 211  174 174   Basic Metabolic Panel: Recent Labs  Lab 12/06/20 1644 12/07/20 0533 12/09/20 0419  NA 139 139 141  K 3.3* 3.5 3.9  CL 102 108 107  CO2 28 25 22   GLUCOSE 106* 94 77  BUN 13 11 11   CREATININE 0.72 0.56* 0.64  CALCIUM 9.4 8.8* 8.7*  PHOS  --   --  1.8*   GFR: Estimated Creatinine Clearance: 93.8 mL/min (by C-G formula based on SCr of 0.64 mg/dL). Liver Function Tests: Recent Labs  Lab 12/06/20 1644 12/09/20 0419  AST 16  --   ALT 15  --   ALKPHOS 112  --   BILITOT 0.6  --   PROT 7.3  --   ALBUMIN 4.1 3.1*   No results for input(s): LIPASE, AMYLASE in the last 168 hours. No results for input(s): AMMONIA in the last 168 hours. Coagulation Profile: Recent Labs  Lab 12/06/20 1644  INR 1.0   Cardiac Enzymes: No results for input(s): CKTOTAL, CKMB, CKMBINDEX, TROPONINI in the last 168 hours. BNP (last 3 results) No results for input(s): PROBNP in the last 8760 hours. HbA1C: No results for input(s): HGBA1C in the last 72 hours. CBG: No results for input(s): GLUCAP in the last 168 hours. Lipid Profile: No results for input(s): CHOL, HDL, LDLCALC, TRIG, CHOLHDL, LDLDIRECT in the last 72 hours. Thyroid Function Tests: No results for input(s): TSH, T4TOTAL, FREET4, T3FREE, THYROIDAB in the last 72 hours. Anemia Panel: No results for input(s): VITAMINB12, FOLATE, FERRITIN, TIBC, IRON, RETICCTPCT in the last 72 hours. Urine analysis:    Component Value Date/Time    COLORURINE YELLOW 12/06/2020 1745   APPEARANCEUR HAZY (A) 12/06/2020 1745   LABSPEC 1.008 12/06/2020 1745   PHURINE 7.0 12/06/2020 1745   GLUCOSEU 50 (A) 12/06/2020 1745   HGBUR MODERATE (A) 12/06/2020 1745   BILIRUBINUR NEGATIVE 12/06/2020 1745   KETONESUR NEGATIVE 12/06/2020 1745   PROTEINUR NEGATIVE 12/06/2020 1745   NITRITE NEGATIVE 12/06/2020 1745   LEUKOCYTESUR LARGE (A) 12/06/2020 1745   Sepsis Labs: @LABRCNTIP (procalcitonin:4,lacticidven:4)  ) Recent Results (from the past 240 hour(s))  Blood Culture (routine x 2)     Status: None (Preliminary result)   Collection Time: 12/06/20  3:32 PM   Specimen: BLOOD LEFT ARM  Result Value Ref Range Status   Specimen Description BLOOD LEFT ARM BOTTLES DRAWN AEROBIC AND ANAEROBIC  Final   Special Requests Blood Culture adequate volume  Final   Culture   Final    NO GROWTH 3 DAYS Performed at [facility], [address] [place], [place] [address]    Report Status PENDING  Incomplete  Resp Panel by RT-PCR (Flu A&B, Covid) Nasopharyngeal Swab     Status: None   Collection Time: 12/06/20  4:10 PM   Specimen: Nasopharyngeal Swab; Nasopharyngeal(NP) swabs in vial transport medium  Result Value Ref Range Status   SARS Coronavirus 2 by RT PCR NEGATIVE NEGATIVE Final    Comment: (NOTE) SARS-CoV-2 target nucleic acids are NOT DETECTED.  The SARS-CoV-2 RNA is generally detectable in upper respiratory specimens during the acute phase of infection. The lowest concentration of SARS-CoV-2 viral copies this assay can detect is 138 copies/mL. A negative result does not preclude SARS-Cov-2 infection and should not be used as the sole basis for treatment or other patient management decisions. A negative result may occur with  improper specimen collection/handling, submission of specimen other than nasopharyngeal swab, presence of viral mutation(s) within the areas targeted by this assay, and inadequate number of  viral copies(<138 copies/mL). A  negative result must be combined with clinical observations, patient history, and epidemiological information. The expected result is Negative.  Fact Sheet for Patients:  BloggerCourse.com  Fact Sheet for Healthcare Providers:  SeriousBroker.it  This test is no t yet approved or cleared by the [place] FDA and  has been authorized for detection and/or diagnosis of SARS-CoV-2 by FDA under an Emergency Use Authorization (EUA). This EUA will remain  in effect (meaning this test can be used) for the duration of the COVID-19 declaration under Section 564(b)(1) of the Act, 21 U.S.C.section 360bbb-3(b)(1), unless the authorization is terminated  or revoked sooner.       Influenza A by PCR NEGATIVE NEGATIVE Final   Influenza B by PCR NEGATIVE NEGATIVE Final    Comment: (NOTE) The Xpert Xpress SARS-CoV-2/FLU/RSV plus assay is intended as an aid in the diagnosis of influenza from Nasopharyngeal swab specimens and should not be used as a sole basis for treatment. Nasal washings and aspirates are unacceptable for Xpert Xpress SARS-CoV-2/FLU/RSV testing.  Fact Sheet for Patients: BloggerCourse.com  Fact Sheet for Healthcare Providers: SeriousBroker.it  This test is not yet approved or cleared by the [place] FDA and has been authorized for detection and/or diagnosis of SARS-CoV-2 by FDA under an Emergency Use Authorization (EUA). This EUA will remain in effect (meaning this test can be used) for the duration of the COVID-19 declaration under Section 564(b)(1) of the Act, 21 U.S.C. section 360bbb-3(b)(1), unless the authorization is terminated or revoked.  Performed at [facility], [address], [place], [place] [address]   Culture, blood (Routine X 2) w Reflex to ID Panel     Status: None (Preliminary result)   Collection Time: 12/06/20  5:17 PM   Specimen: BLOOD LEFT HAND   Result Value Ref Range Status   Specimen Description   Final    BLOOD LEFT HAND BOTTLES DRAWN AEROBIC AND ANAEROBIC   Special Requests Blood Culture adequate volume  Final   Culture   Final    NO GROWTH 3 DAYS Performed at [facility], [address] [place], [place] [address]    Report Status PENDING  Incomplete  Urine Culture     Status: Abnormal   Collection Time: 12/06/20  5:45 PM   Specimen: In/Out Cath Urine  Result Value Ref Range Status   Specimen Description   Final    IN/OUT CATH URINE Performed at [facility], [address], [place], [place] [address]    Special Requests   Final    NONE Performed at [facility], [address], [place], [place] [address]    Culture MULTIPLE SPECIES PRESENT, SUGGEST RECOLLECTION (A)  Final   Report Status 12/07/2020 FINAL  Final  MRSA Next Gen by PCR, Nasal     Status: None   Collection Time: 12/07/20 12:29 PM   Specimen: Nasal Mucosa; Nasal Swab  Result Value Ref Range Status   MRSA by PCR Next Gen NOT DETECTED NOT DETECTED Final    Comment: (NOTE) The GeneXpert MRSA Assay (FDA approved for NASAL specimens only), is one component of a comprehensive MRSA colonization surveillance program. It is not intended to diagnose MRSA infection nor to guide or monitor treatment for MRSA infections. Test performance is not FDA approved in patients less than 1 years old. Performed at [facility], [address], [place], [place] [address]   Urine Culture     Status: None   Collection Time: 12/08/20  9:40 AM  Specimen: Urine, Clean Catch  Result Value Ref Range Status   Specimen Description   Final    URINE, CLEAN CATCH Performed at [facility], [address] [place], [place] [address]    Special Requests   Final    NONE Performed at [facility], [address], [place], [place] [address]    Culture   Final    NO GROWTH Performed at [facility], [address] [place], [place] [address]    Report Status 12/09/2020 FINAL   Final      Radiology Studies: DG CHEST PORT 1 VIEW  Result Date: 12/08/2020 CLINICAL DATA:  Shortness of breath EXAM: PORTABLE CHEST 1 VIEW COMPARISON:  Previous studies including the examination of 09/12/2020 FINDINGS: Transverse diameter of heart is increased. Central pulmonary vessels are more prominent. There is poor inspiration. Increased interstitial markings are seen in the parahilar regions and lower lung fields. There is no focal consolidation. Right lateral CP angle is indistinct. There is no pneumothorax. IMPRESSION: Cardiomegaly. Central pulmonary vessels are more prominent with increased interstitial markings in the parahilar regions and lower lung fields suggesting CHF. There is no focal pulmonary consolidation. Possible minimal right pleural effusion. Electronically Signed   By: Ernie Avena M.D.   On: 12/08/2020 09:23     Scheduled Meds:  bisacodyl  10 mg Rectal Daily   Chlorhexidine Gluconate Cloth  6 each Topical Daily   cycloSPORINE  1 drop Both Eyes BID   diltiazem  90 mg Oral BID   enoxaparin (LOVENOX) injection  40 mg Subcutaneous Q24H   furosemide  20 mg Intravenous Q12H   lacosamide  150 mg Oral q morning   lacosamide  200 mg Oral QHS   linaclotide  145 mcg Oral Daily   mouth rinse  15 mL Mouth Rinse BID   pantoprazole  40 mg Oral Daily   polyethylene glycol  17 g Oral BID   sodium chloride HYPERTONIC  4 mL Nebulization BID   sucralfate  0.5 g Oral BID   tamsulosin  0.4 mg Oral Daily   Continuous Infusions:  sodium chloride Stopped (12/09/20 1043)   sodium chloride     cefTRIAXone (ROCEPHIN)  IV 2 g (12/09/20 0133)   norepinephrine (LEVOPHED) Adult infusion Stopped (12/08/20 0610)     LOS: 3 days   Shon Hale M.D on 12/09/2020 at 5:48 PM  Go to [email or web address] - for contact info  [facility] - Office  [phone]  If 7PM-7AM, please contact night-coverage [email or web address] Password [facility] 12/09/2020, 5:48 PM

## 2020-12-10 MED ORDER — METOPROLOL TARTRATE 5 MG/5ML IV SOLN
5.0000 mg | Freq: Once | INTRAVENOUS | Status: AC
  Administered 2020-12-10: 5 mg via INTRAVENOUS
  Filled 2020-12-10: qty 5

## 2020-12-10 MED ORDER — CARVEDILOL 12.5 MG PO TABS
12.5000 mg | ORAL_TABLET | Freq: Two times a day (BID) | ORAL | Status: DC
  Administered 2020-12-10 – 2020-12-11 (×2): 12.5 mg via ORAL
  Filled 2020-12-10 (×2): qty 1

## 2020-12-10 MED ORDER — LABETALOL HCL 5 MG/ML IV SOLN
10.0000 mg | INTRAVENOUS | Status: DC | PRN
  Administered 2020-12-10: 10 mg via INTRAVENOUS
  Filled 2020-12-10: qty 4

## 2020-12-10 NOTE — Progress Notes (Signed)
1353 Patient's BP 174/109, changed BP cuff again & BP still consistently reading high, PRN Labetalol given as ordered, BP 134/96

## 2020-12-10 NOTE — Progress Notes (Signed)
Patient's BP readings consistently running high with diastolic pressures over 100, repositioned the BP cuff x 3 times & continue resulting in high BPs, no PRN medications ordered at this time for high BP, MD notified

## 2020-12-10 NOTE — Progress Notes (Signed)
PROGRESS NOTE   Joshua Logan, is a 40 y.o. male, DOB - 08/11/1980, TN:[id number]  Admit date - 12/06/2020   Admitting Physician No admitting provider for patient encounter.  Outpatient Primary MD for the patient is Sasser, Silvestre Moment, MD  LOS - 4  Chief Complaint  Patient presents with   Abdominal Pain   Back Pain    Lower abdominal and lower back pain. Pt thinks that it may be a kidney stone.       Brief Narrative:  40 y.o. male with a history of spastic quadriplegia due to  cerebral palsy, hydrocephalus with VP shunt/revised to AP Shunt, with seizures GERD, chronic protein calorie malnutrition, chronic tachycardia on cardizem admitted on 08/05/2020 with sepsis secondary to presumed UTI  Assessment & Plan:   Principal Problem:   Sepsis ([place]) Active Problems:   PSVT (paroxysmal supraventricular tachycardia) (HCC)   Cerebral palsy (HCC)   Hydrocephalus (HCC)   Protein calorie malnutrition (HCC)   GERD (gastroesophageal reflux disease)   Acute lower UTI   1)Sepsis due to Presumed UTI----POA -Patient had fevers, tachycardia, tachypnea and leukocytosis -Fever curve is improving--discontinued cooling blanket overnight  -WBC 16.2  -Patient is off IV Levophed -Lactic acid noted -c/n IV Rocephin pending culture data -=-Cooling Blanket for persistent fevers--fever curve is trending down -Urine culture appears contaminated, urine culture pending -Blood culture NGTD  2)Cerebral Palsy with spastic quadriplegia and hydrocephalus -Status post VP shunt revised to AP Shunt-- -Patient has baclofen pump in left lower quadrant -Supportive care  3)Chronic Tachycardia/paroxysmal SVTs--- c/n  Cardizem SR  to 90 mg twice daily -Tachycardia worsened with fevers and sepsis - 4)GERD-stable continue Protonix and Carafate  5)History of seizures---- continue PTA Vimpat, lorazepam as needed seizures  6)Social/Ethics--plan of care discussed with patient's mom and dad at bedside, patient is a  full code  7)Acute respiratory failure/Ho OSA----suspect due to #volume overload in the setting of sepsis --Chest x-ray suggestive of fluid overload/CHF -Continue IV Lasix, -IV fluids discontinued -PTA patient used BiPAP for OSA but was not on home O2 -Off oxygen currently requiring 1 to 2 L of oxygen via nasal cannula  8)HTN-elevated, Cardizem increased as above #3, added Coreg for better BP and heart rate control  may use IV Hydralazine 10 mg  Every 4 hours Prn for systolic blood pressure over 160 mmhg  Disposition/Need for in-Hospital Stay- patient unable to be discharged at this time due to sepsis secondary to presumed UTI requiring IV antibiotics pending further culture data -Anticipate discharge home in 1 to 2 days pending culture data and clinical improvement especially if cultures remain negative  Status is: Inpatient  Remains inpatient appropriate because: See disposition above  Disposition: The patient is from: Home              Anticipated d/c is to: Home              Anticipated d/c date is: 1 day              Patient currently is not medically stable to d/c. Barriers: Not Clinically Stable-   Code Status :  -  Code Status: Full Code   Family Communication:   Discussed with mother and father at bedside  Consults  :  na  DVT Prophylaxis  :   - SCDs   enoxaparin (LOVENOX) injection 40 mg Start: 12/06/20 2130   Lab Results  Component Value Date   PLT 174 12/09/2020    Inpatient Medications  Scheduled Meds:  bisacodyl  10 mg Rectal Daily   carvedilol  6.25 mg Oral BID WC   Chlorhexidine Gluconate Cloth  6 each Topical Daily   cycloSPORINE  1 drop Both Eyes BID   diltiazem  90 mg Oral BID   enoxaparin (LOVENOX) injection  40 mg Subcutaneous Q24H   furosemide  20 mg Intravenous Q12H   lacosamide  150 mg Oral q morning   lacosamide  200 mg Oral QHS   linaclotide  145 mcg Oral Daily   mouth rinse  15 mL Mouth Rinse BID   pantoprazole  40 mg Oral Daily    polyethylene glycol  17 g Oral BID   sodium chloride HYPERTONIC  4 mL Nebulization BID   sucralfate  0.5 g Oral BID   tamsulosin  0.4 mg Oral Daily   Continuous Infusions:  sodium chloride Stopped (12/09/20 1043)   sodium chloride     cefTRIAXone (ROCEPHIN)  IV 2 g (12/10/20 0030)   PRN Meds:.acetaminophen **OR** acetaminophen, LORazepam, ondansetron **OR** ondansetron (ZOFRAN) IV   Anti-infectives (From admission, onward)    Start     Dose/Rate Route Frequency Ordered Stop   12/08/20 0100  cefTRIAXone (ROCEPHIN) 2 g in sodium chloride 0.9 % 100 mL IVPB        2 g 200 mL/hr over 30 Minutes Intravenous Every 24 hours 12/07/20 1121     12/07/20 0200  aztreonam (AZACTAM) 2 g in sodium chloride 0.9 % 100 mL IVPB  Status:  Discontinued        2 g 200 mL/hr over 30 Minutes Intravenous Every 8 hours 12/06/20 1821 12/06/20 1906   12/07/20 0100  cefTRIAXone (ROCEPHIN) 1 g in sodium chloride 0.9 % 100 mL IVPB  Status:  Discontinued        1 g 200 mL/hr over 30 Minutes Intravenous Every 24 hours 12/06/20 1906 12/07/20 1121   12/06/20 1530  aztreonam (AZACTAM) 2 g in sodium chloride 0.9 % 100 mL IVPB        2 g 200 mL/hr over 30 Minutes Intravenous  Once 12/06/20 1528 12/06/20 1745        Subjective: Joshua Logan today has no emesis,  No chest pain,    =-Able to come off cooling blanket, voiding well, -Hypoxia improving and weaning down oxygen -Mother at bedside -  Objective: Vitals:   12/10/20 0500 12/10/20 0750 12/10/20 0805 12/10/20 0809  BP: (!) 153/87   (!) 169/112  Pulse: 74 96  95  Resp: 10 18    Temp:  98.1 F (36.7 C)    TempSrc:  Axillary    SpO2: 98% 97% 99%   Weight:      Height:        Intake/Output Summary (Last 24 hours) at 12/10/2020 0953 Last data filed at 12/10/2020 0500 Gross per 24 hour  Intake 327.88 ml  Output 2800 ml  Net -2472.12 ml   Filed Weights   12/06/20 1510  Weight: 53.5 kg    Physical Exam  Gen:- Awake Alert, in no acute  distress Neck-Supple Neck,No JVD,. VP/AP/Shunt Nose- Foss 1L/min Lungs-improving air movement, no wheezing  CV- S1, S2 normal, regular , tachycardic Abd-  +ve B.Sounds, Abd Soft, No tenderness, -Patient has baclofen pump in left lower quadrant  Extremity/Skin:- No  edema, pedal pulses present  NeuroPsych--chronic neuromuscular deficits/deformities  due to cerebral palsy with spastic quadriplegia  Data Reviewed: I have personally reviewed following labs and imaging studies  CBC: Recent Labs  Lab 12/06/20 1644 12/07/20  0533 12/09/20 0419  WBC 17.0* 16.5* 16.2*  NEUTROABS 14.6*  --   --   HGB 16.1 15.8 15.4  HCT 47.9 46.5 44.3  MCV 94.5 93.9 94.1  PLT 211 174 [id number]   Basic Metabolic Panel: Recent Labs  Lab 12/06/20 1644 12/07/20 0533 12/09/20 0419  NA 139 139 141  K 3.3* 3.5 3.9  CL 102 108 107  CO2 28 25 22   GLUCOSE 106* 94 77  BUN 13 11 11   CREATININE 0.72 0.56* 0.64  CALCIUM 9.4 8.8* 8.7*  PHOS  --   --  1.8*   GFR: Estimated Creatinine Clearance: 93.8 mL/min (by C-G formula based on SCr of 0.64 mg/dL). Liver Function Tests: Recent Labs  Lab 12/06/20 1644 12/09/20 0419  AST 16  --   ALT 15  --   ALKPHOS 112  --   BILITOT 0.6  --   PROT 7.3  --   ALBUMIN 4.1 3.1*   No results for input(s): LIPASE, AMYLASE in the last 168 hours. No results for input(s): AMMONIA in the last 168 hours. Coagulation Profile: Recent Labs  Lab 12/06/20 1644  INR 1.0   Cardiac Enzymes: No results for input(s): CKTOTAL, CKMB, CKMBINDEX, TROPONINI in the last 168 hours. BNP (last 3 results) No results for input(s): PROBNP in the last 8760 hours. HbA1C: No results for input(s): HGBA1C in the last 72 hours. CBG: No results for input(s): GLUCAP in the last 168 hours. Lipid Profile: No results for input(s): CHOL, HDL, LDLCALC, TRIG, CHOLHDL, LDLDIRECT in the last 72 hours. Thyroid Function Tests: No results for input(s): TSH, T4TOTAL, FREET4, T3FREE, THYROIDAB in the last 72  hours. Anemia Panel: No results for input(s): VITAMINB12, FOLATE, FERRITIN, TIBC, IRON, RETICCTPCT in the last 72 hours. Urine analysis:    Component Value Date/Time   COLORURINE YELLOW 12/06/2020 1745   APPEARANCEUR HAZY (A) 12/06/2020 1745   LABSPEC 1.008 12/06/2020 1745   PHURINE 7.0 12/06/2020 1745   GLUCOSEU 50 (A) 12/06/2020 1745   HGBUR MODERATE (A) 12/06/2020 1745   BILIRUBINUR NEGATIVE 12/06/2020 [place] 12/06/2020 1745   PROTEINUR NEGATIVE 12/06/2020 1745   NITRITE NEGATIVE 12/06/2020 1745   LEUKOCYTESUR LARGE (A) 12/06/2020 1745   Sepsis Labs: @LABRCNTIP (procalcitonin:4,lacticidven:4)  ) Recent Results (from the past 240 hour(s))  Blood Culture (routine x 2)     Status: None (Preliminary result)   Collection Time: 12/06/20  3:32 PM   Specimen: BLOOD LEFT ARM  Result Value Ref Range Status   Specimen Description BLOOD LEFT ARM BOTTLES DRAWN AEROBIC AND ANAEROBIC  Final   Special Requests Blood Culture adequate volume  Final   Culture   Final    NO GROWTH 4 DAYS Performed at [facility], [address] [place] [address], [place], [place] [address]    Report Status PENDING  Incomplete  Resp Panel by RT-PCR (Flu A&B, Covid) Nasopharyngeal Swab     Status: None   Collection Time: 12/06/20  4:10 PM   Specimen: Nasopharyngeal Swab; Nasopharyngeal(NP) swabs in vial transport medium  Result Value Ref Range Status   SARS Coronavirus 2 by RT PCR NEGATIVE NEGATIVE Final    Comment: (NOTE) SARS-CoV-2 target nucleic acids are NOT DETECTED.  The SARS-CoV-2 RNA is generally detectable in upper respiratory specimens during the acute phase of infection. The lowest concentration of SARS-CoV-2 viral copies this assay can detect is 138 copies/mL. A negative result does not preclude SARS-Cov-2 infection and should not be used as the sole basis for treatment or other  patient management decisions. A negative result may occur with  improper specimen collection/handling, submission  of specimen other than nasopharyngeal swab, presence of viral mutation(s) within the areas targeted by this assay, and inadequate number of viral copies(<138 copies/mL). A negative result must be combined with clinical observations, patient history, and epidemiological information. The expected result is Negative.  Fact Sheet for Patients:  EntrepreneurPulse.com.au  Fact Sheet for Healthcare Providers:  IncredibleEmployment.be  This test is no t yet approved or cleared by the [place] FDA and  has been authorized for detection and/or diagnosis of SARS-CoV-2 by FDA under an Emergency Use Authorization (EUA). This EUA will remain  in effect (meaning this test can be used) for the duration of the COVID-19 declaration under Section 564(b)(1) of the Act, 21 U.S.C.section 360bbb-3(b)(1), unless the authorization is terminated  or revoked sooner.       Influenza A by PCR NEGATIVE NEGATIVE Final   Influenza B by PCR NEGATIVE NEGATIVE Final    Comment: (NOTE) The Xpert Xpress SARS-CoV-2/FLU/RSV plus assay is intended as an aid in the diagnosis of influenza from Nasopharyngeal swab specimens and should not be used as a sole basis for treatment. Nasal washings and aspirates are unacceptable for Xpert Xpress SARS-CoV-2/FLU/RSV testing.  Fact Sheet for Patients: EntrepreneurPulse.com.au  Fact Sheet for Healthcare Providers: IncredibleEmployment.be  This test is not yet approved or cleared by the [place] FDA and has been authorized for detection and/or diagnosis of SARS-CoV-2 by FDA under an Emergency Use Authorization (EUA). This EUA will remain in effect (meaning this test can be used) for the duration of the COVID-19 declaration under Section 564(b)(1) of the Act, 21 U.S.C. section 360bbb-3(b)(1), unless the authorization is terminated or revoked.  Performed at [facility], [address]  [place], [place] [address]   Culture, blood (Routine X 2) w Reflex to ID Panel     Status: None (Preliminary result)   Collection Time: 12/06/20  5:17 PM   Specimen: BLOOD LEFT HAND  Result Value Ref Range Status   Specimen Description   Final    BLOOD LEFT HAND BOTTLES DRAWN AEROBIC AND ANAEROBIC   Special Requests Blood Culture adequate volume  Final   Culture   Final    NO GROWTH 4 DAYS Performed at [facility], [address] [place], [place] [address]    Report Status PENDING  Incomplete  Urine Culture     Status: Abnormal   Collection Time: 12/06/20  5:45 PM   Specimen: In/Out Cath Urine  Result Value Ref Range Status   Specimen Description   Final    IN/OUT CATH URINE Performed at [facility], [address], [place], [place] [address]    Special Requests   Final    NONE Performed at [facility], [address], [place], [place] [address]    Culture MULTIPLE SPECIES PRESENT, SUGGEST RECOLLECTION (A)  Final   Report Status 12/07/2020 FINAL  Final  MRSA Next Gen by PCR, Nasal     Status: None   Collection Time: 12/07/20 12:29 PM   Specimen: Nasal Mucosa; Nasal Swab  Result Value Ref Range Status   MRSA by PCR Next Gen NOT DETECTED NOT DETECTED Final    Comment: (NOTE) The GeneXpert MRSA Assay (FDA approved for NASAL specimens only), is one component of a comprehensive MRSA colonization surveillance program. It is not intended to diagnose MRSA infection nor to guide or monitor treatment for MRSA infections. Test performance is not FDA approved in patients less  than 67 years old. Performed at [facility], [address], [place], [place] [address]   Urine Culture     Status: None   Collection Time: 12/08/20  9:40 AM   Specimen: Urine, Clean Catch  Result Value Ref Range Status   Specimen Description   Final    URINE, CLEAN CATCH Performed at [facility], [address], [place], [place] [address]    Special Requests   Final    NONE Performed at [facility],  [address], [place], [place] [address]    Culture   Final    NO GROWTH Performed at [facility], [address], [place], [place] [address]    Report Status 12/09/2020 FINAL  Final      Radiology Studies: No results found.   Scheduled Meds:  bisacodyl  10 mg Rectal Daily   carvedilol  6.25 mg Oral BID WC   Chlorhexidine Gluconate Cloth  6 each Topical Daily   cycloSPORINE  1 drop Both Eyes BID   diltiazem  90 mg Oral BID   enoxaparin (LOVENOX) injection  40 mg Subcutaneous Q24H   furosemide  20 mg Intravenous Q12H   lacosamide  150 mg Oral q morning   lacosamide  200 mg Oral QHS   linaclotide  145 mcg Oral Daily   mouth rinse  15 mL Mouth Rinse BID   pantoprazole  40 mg Oral Daily   polyethylene glycol  17 g Oral BID   sodium chloride HYPERTONIC  4 mL Nebulization BID   sucralfate  0.5 g Oral BID   tamsulosin  0.4 mg Oral Daily   Continuous Infusions:  sodium chloride Stopped (12/09/20 1043)   sodium chloride     cefTRIAXone (ROCEPHIN)  IV 2 g (12/10/20 0030)     LOS: 4 days   Shon Hale M.D on 12/10/2020 at 9:53 AM  Go to [email or web address] - for contact info  [facility] - Office  [phone]  If 7PM-7AM, please contact night-coverage [email or web address] Password [facility] 12/10/2020, 9:53 AM

## 2020-12-11 ENCOUNTER — Encounter: Payer: Self-pay | Admitting: Cardiology

## 2020-12-11 ENCOUNTER — Inpatient Hospital Stay (HOSPITAL_COMMUNITY): Payer: Medicare PPO

## 2020-12-11 DIAGNOSIS — J9600 Acute respiratory failure, unspecified whether with hypoxia or hypercapnia: Secondary | ICD-10-CM

## 2020-12-11 DIAGNOSIS — Z8669 Personal history of other diseases of the nervous system and sense organs: Secondary | ICD-10-CM

## 2020-12-11 DIAGNOSIS — R6511 Systemic inflammatory response syndrome (SIRS) of non-infectious origin with acute organ dysfunction: Secondary | ICD-10-CM | POA: Diagnosis present

## 2020-12-11 DIAGNOSIS — Z8709 Personal history of other diseases of the respiratory system: Secondary | ICD-10-CM

## 2020-12-11 DIAGNOSIS — R6510 Systemic inflammatory response syndrome (SIRS) of non-infectious origin without acute organ dysfunction: Secondary | ICD-10-CM

## 2020-12-11 DIAGNOSIS — I10 Essential (primary) hypertension: Secondary | ICD-10-CM

## 2020-12-11 LAB — BASIC METABOLIC PANEL
Anion gap: 13 (ref 5–15)
BUN: 22 mg/dL — ABNORMAL HIGH (ref 6–20)
CO2: 27 mmol/L (ref 22–32)
Calcium: 8.6 mg/dL — ABNORMAL LOW (ref 8.9–10.3)
Chloride: 99 mmol/L (ref 98–111)
Creatinine, Ser: 0.86 mg/dL (ref 0.61–1.24)
GFR, Estimated: >60 mL/min (ref >60)
Glucose, Bld: 107 mg/dL — ABNORMAL HIGH (ref 70–99)
Potassium: 3.9 mmol/L (ref 3.5–5.1)
Sodium: 139 mmol/L (ref 135–145)

## 2020-12-11 LAB — CBC
HCT: 44.5 % (ref 39.0–52.0)
Hemoglobin: 14.8 g/dL (ref 13.0–17.0)
MCH: 31.8 pg (ref 26.0–34.0)
MCHC: 33.3 g/dL (ref 30.0–36.0)
MCV: 95.5 fL (ref 80.0–100.0)
Platelets: 207 10*3/uL (ref 150–400)
RBC: 4.66 MIL/uL (ref 4.22–5.81)
RDW: 14.1 % (ref 11.5–15.5)
WBC: 7.1 10*3/uL (ref 4.0–10.5)
nRBC: 0 % (ref 0.0–0.2)

## 2020-12-11 LAB — CULTURE, BLOOD (ROUTINE X 2)
Culture: NO GROWTH
Culture: NO GROWTH
Special Requests: ADEQUATE
Special Requests: ADEQUATE

## 2020-12-11 MED ORDER — ACETAMINOPHEN 325 MG PO TABS
650.0000 mg | ORAL_TABLET | Freq: Four times a day (QID) | ORAL | 0 refills | Status: DC | PRN
Start: 2020-12-11 — End: 2021-11-10

## 2020-12-11 MED ORDER — CEFDINIR 300 MG PO CAPS: 300.0000 mg | ORAL_CAPSULE | Freq: Two times a day (BID) | ORAL | 0 refills | Status: AC

## 2020-12-11 MED ORDER — DILTIAZEM HCL ER 90 MG PO CP12
90.0000 mg | ORAL_CAPSULE | Freq: Two times a day (BID) | ORAL | 3 refills | Status: DC
Start: 2020-12-11 — End: 2021-04-19

## 2020-12-11 NOTE — Discharge Instructions (Signed)
1) complete cefdinir/Omnicef antibiotic as prescribed 2) please increase Cardizem to 90 mg twice daily from 60 mg twice a day for better heart rate control 3) please maintain adequate hydration and push oral fluids

## 2020-12-11 NOTE — Progress Notes (Signed)
Patient discharge instructions complete with patient's mother/caregiver & patient ready for discharge home, vital signs stable, condom catheter & two IV catheters removed intact with no complications, personal wheelchair at bedside & mother confirmed that she has her wheelchair Zenaida Niece here to transport him home, MD notified

## 2020-12-11 NOTE — Progress Notes (Signed)
1409 Patient assisted into wheelchair van with mother's assistance in route home

## 2020-12-11 NOTE — Discharge Summary (Signed)
Joshua Logan, is a 40 y.o. male  DOB 26-Jan-1980  MRN [id number].  Admission date:  12/06/2020  Admitting Physician  No admitting provider for patient encounter.  Discharge Date:  12/11/2020   Primary MD  Quintin Alto, Silvestre Moment, MD  Recommendations for primary care physician for things to follow:   1) complete cefdinir/Omnicef antibiotic as prescribed 2) please increase Cardizem to 90 mg twice daily from 60 mg twice a day for better heart rate control 3) please maintain adequate hydration and push oral fluids   Admission Diagnosis  Acute cystitis without hematuria [N30.00] Sepsis ([place]) [A41.9]  Discharge Diagnosis  Acute cystitis without hematuria [N30.00] Sepsis ([place]) [A41.9]    Principal Problem:   SIRS without infection with organ dysfunction - Active Problems:   PSVT (paroxysmal supraventricular tachycardia) (HCC)   Cerebral palsy (HCC)   Hydrocephalus (HCC)   OSA (obstructive sleep apnea)   Protein calorie malnutrition (HCC)   GERD (gastroesophageal reflux disease)   Acute lower UTI      Past Medical History:  Diagnosis Date   Cerebral palsy (HCC)    with spastic quadriparesis with numerous orthopedic procedures for contractures and fratures   Chronic constipation    Chronic pulmonary aspiration    CSA (central sleep apnea)    GERD (gastroesophageal reflux disease)    Hydrocephalus (HCC)    s/p VP shunt   Hypoxemia    chronic   Iron deficiency anemia    OSA (obstructive sleep apnea)    Protein calorie malnutrition ([place])    chronic   Tachycardia    chronic    Past Surgical History:  Procedure Laterality Date   BACK SURGERY     [place] RODS PLACED FOR SCOLIOSIS   BACLOFEN PUMP PLACEMENT     CSF SHUNT     EYE SURGERY     MULTIPLE   HIP SURGERY     MULTIPLE   INGUINAL HERNIA REPAIR     BILATERAL   NEPHRECTOMY     [place]       HPI   from the history and physical done on the day of admission:    Patient Coming From: home   Chief Complaint: Fever   HPI: Joshua C Barbuto is a 40 y.o. male with a history of spastic quadriplegia can [place] to cerebral palsy, hydrocephalus with VP shunt, GERD, chronic protein calorie malnutrition, chronic tachycardia.  Due to the cerebral palsy, the patient is largely nonverbal and history is obtained by the patient's mother.  Patient has had frequent UTIs and is currently on prophylaxis.  Patient developed a fever today of 101.8 rectally.  He was given Tylenol at 11:00.  He also began having suprapubic pain and was brought to the hospital for evaluation.  No palliating or provoking factors.   Emergency Department Course: Suggestive of UTI.  Patient given aztreonam and urine culture obtained.  Blood cultures were also obtained.  White count of 17,000.  Initial lactic acid 1.1 with repeat of 2.3.  Hospital Course:      Brief Narrative:  40 y.o. male with a history of spastic quadriplegia due to  cerebral palsy, hydrocephalus with VP shunt/revised to AP Shunt, with seizures GERD, chronic protein calorie malnutrition, chronic tachycardia on cardizem admitted on 08/05/2020 with sepsis secondary to presumed UTI   Assessment & Plan:   Principal Problem: Problem  SIRS without infection with organ dysfunction -  Cerebral Palsy (Hcc)  Hydrocephalus (Hcc)  PSVT (paroxysmal supraventricular tachycardia) (HCC)  Protein Calorie Malnutrition (Hcc)  Gerd (Gastroesophageal Reflux Disease)  Osa (Obstructive Sleep Apnea)  Sepsis (Hcc) (Resolved)      1)SIRS--- sepsis appears to have been ruled out , urine and blood cultures without growth -Patient had fevers, tachycardia, tachypnea and leukocytosis -Fever curve is improving--discontinued cooling blanket overnight  -WBC 16.2 >>7.1 -Patient is off IV Levophed -Treated with IV Rocephin no longer requiring coolingBlanket -Urine culture appears  contaminated, urine culture pending -Blood culture NGTD -Hemodynamically more stable -Okay to discharge on Omnicef   2)Cerebral Palsy with spastic quadriplegia and hydrocephalus -Status post VP shunt revised to AP Shunt-- -Patient has baclofen pump in left lower quadrant -Supportive care   3)Chronic Tachycardia/paroxysmal SVTs--- c/n  Cardizem SR  to 90 mg twice daily -Tachycardia worsened with fevers and sepsis -Heart rate control improved with increased dose of Cardizem - 4)GERD-stable continue Protonix and Carafate   5)History of seizures---- continue PTA Vimpat, lorazepam as needed seizures   6)Social/Ethics--plan of care discussed with patient's mom and dad at bedside, patient is a full code   7)Acute respiratory failure/Ho OSA----suspect due to #volume overload in the setting of sepsis --Chest x-ray suggestive of fluid overload/CHF--- clinically and radiologically improved with IV Lasix -PTA patient used BiPAP for OSA but was not on home O2 -Off oxygen currently requiring 1 to 2 L of oxygen via nasal cannula -Hypoxia resolved, patient was weaned off O2   8)HTN-elevated, Cardizem increased as above #3,   Disposition--discharge home with mother   Disposition: The patient is from: Home              Anticipated d/c is to: Home               Code Status :  -  Code Status: Full Code    Family Communication:   Discussed with mother and father at bedside   Discharge Condition: stable  Diet and Activity recommendation:  As advised  Discharge Instructions    Discharge Instructions     Call MD for:  difficulty breathing, headache or visual disturbances   Complete by: As directed    Call MD for:  persistant dizziness or light-headedness   Complete by: As directed    Call MD for:  persistant nausea and vomiting   Complete by: As directed    Call MD for:  temperature >100.4   Complete by: As directed    Diet - low sodium heart healthy   Complete by: As directed     Discharge instructions   Complete by: As directed    1) complete cefdinir/Omnicef antibiotic as prescribed 2) please increase Cardizem to 90 mg twice daily from 60 mg twice a day for better heart rate control 3) please maintain adequate hydration and push oral fluids   Increase activity slowly   Complete by: As directed         Discharge Medications     Allergies as of 12/11/2020       Reactions   Amoxicillin-pot Clavulanate Other (See Comments),  Rash   blisters Blisters. Pt can take Amoxicillin, but NOT Augmentin   Sulfamethoxazole-trimethoprim Rash   Doxycycline Other (See Comments)   Other reaction(s): Other (See Comments) Bad reflux Bad reflux   Whole Blood    Cefazolin Rash   Midazolam Hcl Other (See Comments)   Un-reactive pupils after surgery   Septra [bactrim] Hives   Vancomycin Rash        Medication List     STOP taking these medications    amoxicillin 500 MG capsule Commonly known as: AMOXIL   nitrofurantoin (macrocrystal-monohydrate) 100 MG capsule Commonly known as: MACROBID       TAKE these medications    acetaminophen 325 MG tablet Commonly known as: TYLENOL Take 2 tablets (650 mg total) by mouth every 6 (six) hours as needed for mild pain or headache (or Fever >/= 101).   BACLOFEN IT 1,449 mcg by Intrathecal route daily.   cefdinir 300 MG capsule Commonly known as: OMNICEF Take 1 capsule (300 mg total) by mouth 2 (two) times daily for 3 days. Start taking on: December 12, 2020   cephALEXin 250 MG capsule Commonly known as: KEFLEX   diltiazem 90 MG 12 hr capsule Commonly known as: CARDIZEM SR Take 1 capsule (90 mg total) by mouth 2 (two) times daily. What changed:  medication strength how much to take   ketoconazole 2 % shampoo Commonly known as: NIZORAL Apply 1 application topically as needed.   lacosamide 200 MG Tabs tablet Commonly known as: VIMPAT Take 200 mg by mouth at bedtime. What changed: Another medication with  the same name was removed. Continue taking this medication, and follow the directions you see here.   Vimpat 150 MG Tabs Generic drug: Lacosamide Take 1 tablet by mouth every morning. What changed: Another medication with the same name was removed. Continue taking this medication, and follow the directions you see here.   lidocaine-prilocaine cream Commonly known as: EMLA Apply topically.   Linzess 145 MCG Caps capsule Generic drug: linaclotide Take 145 mcg by mouth daily.   omeprazole 40 MG capsule Commonly known as: PRILOSEC Take 1 capsule by mouth 2 (two) times daily.   polyethylene glycol 17 g packet Commonly known as: MIRALAX / GLYCOLAX Take 8.5 g by mouth daily as needed.   Restasis 0.05 % ophthalmic emulsion Generic drug: cycloSPORINE 1 drop 2 (two) times daily.   sodium chloride HYPERTONIC 3 % nebulizer solution Take by nebulization 2 (two) times daily.   sucralfate 1 GM/10ML suspension Commonly known as: CARAFATE Take 500 mg by mouth 2 (two) times daily.   tamsulosin 0.4 MG Caps capsule Commonly known as: FLOMAX Take 0.4 mg by mouth daily.   tretinoin 0.025 % cream Commonly known as: RETIN-A Apply a a thin layer to affected areas nightly 20 min after washing. Start with two nights weekly and increase as tolerated.   Urea 40 % Lotn APPLY 1-2 TIMES DAILY TO KERTOSIS PILARIS ON ARMS        Major procedures and Radiology Reports - PLEASE review detailed and final reports for all details, in brief -   DG CHEST PORT 1 VIEW  Result Date: 12/11/2020 CLINICAL DATA:  Shortness of breath, fever, aspiration EXAM: PORTABLE CHEST 1 VIEW COMPARISON:  Chest radiograph 12/08/2020 FINDINGS: The patient is rotated to the right. Catheter tubing projecting over the right aspect of the mediastinum with the tip terminating in the region of the cavoatrial junction is unchanged. The heart is enlarged, unchanged. Lung volumes are low. There  is no focal consolidation. Previously  seen vascular congestion appears improved. There is no overt pulmonary edema. There is a trace right pleural effusion. There is no significant left pleural effusion. There is no appreciable pneumothorax. Spinal rods are again noted. IMPRESSION: 1. Low lung volumes with a trace right pleural effusion. 2. Improved vascular congestion since the prior study. No new or worsening focal airspace disease. Electronically Signed   By: Lesia Hausen M.D.   On: 12/11/2020 08:23   DG CHEST PORT 1 VIEW  Result Date: 12/08/2020 CLINICAL DATA:  Shortness of breath EXAM: PORTABLE CHEST 1 VIEW COMPARISON:  Previous studies including the examination of 09/12/2020 FINDINGS: Transverse diameter of heart is increased. Central pulmonary vessels are more prominent. There is poor inspiration. Increased interstitial markings are seen in the parahilar regions and lower lung fields. There is no focal consolidation. Right lateral CP angle is indistinct. There is no pneumothorax. IMPRESSION: Cardiomegaly. Central pulmonary vessels are more prominent with increased interstitial markings in the parahilar regions and lower lung fields suggesting CHF. There is no focal pulmonary consolidation. Possible minimal right pleural effusion. Electronically Signed   By: Ernie Avena M.D.   On: 12/08/2020 09:23   DG Chest Port 1 View  Result Date: 12/06/2020 CLINICAL DATA:  Abdominal pain and back pain.  Possible sepsis. EXAM: PORTABLE CHEST 1 VIEW COMPARISON:  09/12/2020 FINDINGS: Partially visualized thoracolumbar fixation hardware. Stable shunt catheters. Stable cardiomediastinal contours. Slightly low lung volumes. No focal airspace consolidation, pleural effusion, or pneumothorax. IMPRESSION: No acute cardiopulmonary disease. Electronically Signed   By: Duanne Guess D.O.   On: 12/06/2020 16:11   CT Renal Stone Study  Result Date: 12/06/2020 CLINICAL DATA:  Lower abdominal back pain, fever EXAM: CT ABDOMEN AND PELVIS WITHOUT  CONTRAST TECHNIQUE: Multidetector CT imaging of the abdomen and pelvis was performed following the standard protocol without IV contrast. Unenhanced CT was performed per clinician order. Lack of IV contrast limits sensitivity and specificity, especially for evaluation of abdominal/pelvic solid viscera. COMPARISON:  None. FINDINGS: Lower chest: Minimal hypoventilatory changes at the lung bases. No acute airspace disease. Hepatobiliary: No focal liver abnormality is seen. No gallstones, gallbladder wall thickening, or biliary dilatation. Pancreas: Unremarkable. No pancreatic ductal dilatation or surrounding inflammatory changes. Spleen: Normal in size without focal abnormality. Adrenals/Urinary Tract: The left kidney is surgically absent. Right kidney is unremarkable without nephrolithiasis or hydronephrosis. Bladder is moderately distended without focal abnormality. Adrenals are grossly normal. Stomach/Bowel: No bowel obstruction or ileus. Significant retained stool throughout the colon compatible constipation. Normal appendix right lower quadrant. No bowel wall thickening or inflammatory change. Vascular/Lymphatic: No significant vascular findings are present. No enlarged abdominal or pelvic lymph nodes. Reproductive: Moderate enlargement the prostate measuring up to 5.8 x 3.6 cm in transverse dimension. Other: No free fluid or free gas. No abdominal wall hernia. Implanted pump within the left lower quadrant anterior abdominal wall, with catheter tubing extending toward the central canal. The superior extent of the tubing is excluded by slice selection. Musculoskeletal: Postsurgical changes are seen from thoracolumbar fusion. There are no acute displaced fractures. Congenital deformities of the bilateral hips, with postsurgical changes of the proximal right femur. Reconstructed images demonstrate no additional findings. IMPRESSION: 1. No evidence of urinary tract calculi or obstructive uropathy. Patient is status  post left nephrectomy. 2. Significant retained stool throughout the colon consistent with constipation. No bowel obstruction or ileus. 3. Enlarged prostate. Electronically Signed   By: Sharlet Salina M.D.   On: 12/06/2020 18:56  Micro Results   Recent Results (from the past 240 hour(s))  Blood Culture (routine x 2)     Status: None   Collection Time: 12/06/20  3:32 PM   Specimen: BLOOD LEFT ARM  Result Value Ref Range Status   Specimen Description BLOOD LEFT ARM BOTTLES DRAWN AEROBIC AND ANAEROBIC  Final   Special Requests Blood Culture adequate volume  Final   Culture   Final    NO GROWTH 5 DAYS Performed at [facility], [address] [place], [place] [address]    Report Status 12/11/2020 FINAL  Final  Resp Panel by RT-PCR (Flu A&B, Covid) Nasopharyngeal Swab     Status: None   Collection Time: 12/06/20  4:10 PM   Specimen: Nasopharyngeal Swab; Nasopharyngeal(NP) swabs in vial transport medium  Result Value Ref Range Status   SARS Coronavirus 2 by RT PCR NEGATIVE NEGATIVE Final    Comment: (NOTE) SARS-CoV-2 target nucleic acids are NOT DETECTED.  The SARS-CoV-2 RNA is generally detectable in upper respiratory specimens during the acute phase of infection. The lowest concentration of SARS-CoV-2 viral copies this assay can detect is 138 copies/mL. A negative result does not preclude SARS-Cov-2 infection and should not be used as the sole basis for treatment or other patient management decisions. A negative result may occur with  improper specimen collection/handling, submission of specimen other than nasopharyngeal swab, presence of viral mutation(s) within the areas targeted by this assay, and inadequate number of viral copies(<138 copies/mL). A negative result must be combined with clinical observations, patient history, and epidemiological information. The expected result is Negative.  Fact Sheet for Patients:  EntrepreneurPulse.com.au  Fact Sheet for  Healthcare Providers:  IncredibleEmployment.be  This test is no t yet approved or cleared by the [place] FDA and  has been authorized for detection and/or diagnosis of SARS-CoV-2 by FDA under an Emergency Use Authorization (EUA). This EUA will remain  in effect (meaning this test can be used) for the duration of the COVID-19 declaration under Section 564(b)(1) of the Act, 21 U.S.C.section 360bbb-3(b)(1), unless the authorization is terminated  or revoked sooner.       Influenza A by PCR NEGATIVE NEGATIVE Final   Influenza B by PCR NEGATIVE NEGATIVE Final    Comment: (NOTE) The Xpert Xpress SARS-CoV-2/FLU/RSV plus assay is intended as an aid in the diagnosis of influenza from Nasopharyngeal swab specimens and should not be used as a sole basis for treatment. Nasal washings and aspirates are unacceptable for Xpert Xpress SARS-CoV-2/FLU/RSV testing.  Fact Sheet for Patients: EntrepreneurPulse.com.au  Fact Sheet for Healthcare Providers: IncredibleEmployment.be  This test is not yet approved or cleared by the [place] FDA and has been authorized for detection and/or diagnosis of SARS-CoV-2 by FDA under an Emergency Use Authorization (EUA). This EUA will remain in effect (meaning this test can be used) for the duration of the COVID-19 declaration under Section 564(b)(1) of the Act, 21 U.S.C. section 360bbb-3(b)(1), unless the authorization is terminated or revoked.  Performed at [facility], [address], [place], [place] [address]   Culture, blood (Routine X 2) w Reflex to ID Panel     Status: None   Collection Time: 12/06/20  5:17 PM   Specimen: BLOOD LEFT HAND  Result Value Ref Range Status   Specimen Description   Final    BLOOD LEFT HAND BOTTLES DRAWN AEROBIC AND ANAEROBIC   Special Requests Blood Culture adequate volume  Final   Culture   Final    NO GROWTH  5 DAYS Performed at [facility], [address], [place], [place] [address]    Report Status 12/11/2020 FINAL  Final  Urine Culture     Status: Abnormal   Collection Time: 12/06/20  5:45 PM   Specimen: In/Out Cath Urine  Result Value Ref Range Status   Specimen Description   Final    IN/OUT CATH URINE Performed at [facility], [address], [place], [place] [address]    Special Requests   Final    NONE Performed at [facility], [address] [place], [place] [address]    Culture MULTIPLE SPECIES PRESENT, SUGGEST RECOLLECTION (A)  Final   Report Status 12/07/2020 FINAL  Final  MRSA Next Gen by PCR, Nasal     Status: None   Collection Time: 12/07/20 12:29 PM   Specimen: Nasal Mucosa; Nasal Swab  Result Value Ref Range Status   MRSA by PCR Next Gen NOT DETECTED NOT DETECTED Final    Comment: (NOTE) The GeneXpert MRSA Assay (FDA approved for NASAL specimens only), is one component of a comprehensive MRSA colonization surveillance program. It is not intended to diagnose MRSA infection nor to guide or monitor treatment for MRSA infections. Test performance is not FDA approved in patients less than 30 years old. Performed at [facility], [address], [place], [place] [address]   Urine Culture     Status: None   Collection Time: 12/08/20  9:40 AM   Specimen: Urine, Clean Catch  Result Value Ref Range Status   Specimen Description   Final    URINE, CLEAN CATCH Performed at [facility], [address], [place], [place] [address]    Special Requests   Final    NONE Performed at [facility], [address], [place], [place] [address]    Culture   Final    NO GROWTH Performed at [facility], [place] [address], [place], [place] [address]    Report Status 12/09/2020 FINAL  Final       Today   Subjective    Joshua Logan today has no new complaints - No further fevers, hypoxia has resolved -Mom at bedside, questions answered          Patient has been seen and examined prior to discharge   Objective    Blood pressure 125/89, pulse 95, temperature 97.6 F (36.4 C), temperature source Axillary, resp. rate 11, height 5\' 6"  (1.676 m), weight 53.5 kg, SpO2 94 %.   Intake/Output Summary (Last 24 hours) at 12/11/2020 1229 Last data filed at 12/11/2020 0600 Gross per 24 hour  Intake 349.61 ml  Output 1800 ml  Net -1450.39 ml    Exam   Gen:- Awake Alert, in no acute distress Neck-Supple Neck,No JVD,. VP/AP/Shunt Lungs-improved air movement, no wheezing  CV- S1, S2 normal, regular ,  Abd-  +ve B.Sounds, Abd Soft, No tenderness, -Patient has baclofen pump in left lower quadrant  Extremity/Skin:- No  edema, pedal pulses present  NeuroPsych--chronic neuromuscular deficits/deformities  due to cerebral palsy with spastic quadriplegia   Data Review   CBC w Diff:  Lab Results  Component Value Date   WBC 7.1 12/11/2020   HGB 14.8 12/11/2020   HCT 44.5 12/11/2020   PLT 207 12/11/2020   LYMPHOPCT 4 12/06/2020   MONOPCT 8 12/06/2020   EOSPCT 0 12/06/2020   BASOPCT 0 12/06/2020    CMP:  Lab Results  Component Value Date   NA 139 12/11/2020   K 3.9 12/11/2020  CL 99 12/11/2020   CO2 27 12/11/2020   BUN 22 (H) 12/11/2020   CREATININE 0.86 12/11/2020   PROT 7.3 12/06/2020   ALBUMIN 3.1 (L) 12/09/2020   BILITOT 0.6 12/06/2020   ALKPHOS 112 12/06/2020   AST 16 12/06/2020   ALT 15 12/06/2020  .   Total Discharge time is about 33 minutes  Roxan Hockey M.D on 12/11/2020 at 12:29 PM  Go to [email or web address] -  for contact info  [facility] - Office  [phone]

## 2020-12-15 NOTE — Telephone Encounter (Signed)
Records reviewed, I think the fluid built up likely due to the IV fluids they were giving him for his infection but would like to repeat an echo to see if any changes in his heart function, can we order an echo for SOB. Also in setting of his infection HRs were elevated, this is common and HR's should get back to normal as he recovers from infection. Has she noticed any elevated heart rates at home on the current dose of diltiazem?   Dina Rich MD

## 2020-12-16 NOTE — Telephone Encounter (Signed)
I think 90mg  in the AM and 60mg  of diltiazem in the evening is fine for now. The HR and bp often natually come down at night and if that was an issue in the hospital then I would continue her current dosing.  J Esco Joslyn MD

## 2020-12-21 DIAGNOSIS — G800 Spastic quadriplegic cerebral palsy: Secondary | ICD-10-CM | POA: Diagnosis not present

## 2020-12-21 DIAGNOSIS — N390 Urinary tract infection, site not specified: Secondary | ICD-10-CM | POA: Diagnosis not present

## 2020-12-21 DIAGNOSIS — N3000 Acute cystitis without hematuria: Secondary | ICD-10-CM | POA: Diagnosis not present

## 2020-12-21 DIAGNOSIS — R509 Fever, unspecified: Secondary | ICD-10-CM | POA: Diagnosis not present

## 2020-12-28 DIAGNOSIS — R300 Dysuria: Secondary | ICD-10-CM | POA: Diagnosis not present

## 2021-01-07 DIAGNOSIS — Z79899 Other long term (current) drug therapy: Secondary | ICD-10-CM | POA: Diagnosis not present

## 2021-01-07 DIAGNOSIS — R252 Cramp and spasm: Secondary | ICD-10-CM | POA: Diagnosis not present

## 2021-01-07 DIAGNOSIS — Z451 Encounter for adjustment and management of infusion pump: Secondary | ICD-10-CM | POA: Diagnosis not present

## 2021-01-07 DIAGNOSIS — G919 Hydrocephalus, unspecified: Secondary | ICD-10-CM | POA: Diagnosis not present

## 2021-01-07 DIAGNOSIS — Z982 Presence of cerebrospinal fluid drainage device: Secondary | ICD-10-CM | POA: Diagnosis not present

## 2021-01-07 DIAGNOSIS — G800 Spastic quadriplegic cerebral palsy: Secondary | ICD-10-CM | POA: Diagnosis not present

## 2021-01-10 DIAGNOSIS — N3000 Acute cystitis without hematuria: Secondary | ICD-10-CM | POA: Diagnosis not present

## 2021-01-10 DIAGNOSIS — R509 Fever, unspecified: Secondary | ICD-10-CM | POA: Diagnosis not present

## 2021-01-10 DIAGNOSIS — G800 Spastic quadriplegic cerebral palsy: Secondary | ICD-10-CM | POA: Diagnosis not present

## 2021-01-10 DIAGNOSIS — N390 Urinary tract infection, site not specified: Secondary | ICD-10-CM | POA: Diagnosis not present

## 2021-01-13 DIAGNOSIS — R300 Dysuria: Secondary | ICD-10-CM | POA: Diagnosis not present

## 2021-01-13 DIAGNOSIS — I471 Supraventricular tachycardia: Secondary | ICD-10-CM | POA: Diagnosis not present

## 2021-01-13 DIAGNOSIS — I10 Essential (primary) hypertension: Secondary | ICD-10-CM | POA: Diagnosis not present

## 2021-01-13 DIAGNOSIS — Z982 Presence of cerebrospinal fluid drainage device: Secondary | ICD-10-CM | POA: Diagnosis not present

## 2021-01-14 DIAGNOSIS — J90 Pleural effusion, not elsewhere classified: Secondary | ICD-10-CM | POA: Diagnosis not present

## 2021-01-14 DIAGNOSIS — G4731 Primary central sleep apnea: Secondary | ICD-10-CM | POA: Diagnosis not present

## 2021-02-18 DIAGNOSIS — R252 Cramp and spasm: Secondary | ICD-10-CM | POA: Diagnosis not present

## 2021-02-18 DIAGNOSIS — G808 Other cerebral palsy: Secondary | ICD-10-CM | POA: Diagnosis not present

## 2021-02-23 DIAGNOSIS — G808 Other cerebral palsy: Secondary | ICD-10-CM | POA: Diagnosis not present

## 2021-02-24 DIAGNOSIS — G4730 Sleep apnea, unspecified: Secondary | ICD-10-CM | POA: Diagnosis not present

## 2021-02-24 DIAGNOSIS — R0683 Snoring: Secondary | ICD-10-CM | POA: Diagnosis not present

## 2021-04-01 DIAGNOSIS — R252 Cramp and spasm: Secondary | ICD-10-CM | POA: Diagnosis not present

## 2021-04-01 DIAGNOSIS — G40219 Localization-related (focal) (partial) symptomatic epilepsy and epileptic syndromes with complex partial seizures, intractable, without status epilepticus: Secondary | ICD-10-CM | POA: Diagnosis not present

## 2021-04-01 DIAGNOSIS — Z79899 Other long term (current) drug therapy: Secondary | ICD-10-CM | POA: Diagnosis not present

## 2021-04-01 DIAGNOSIS — G800 Spastic quadriplegic cerebral palsy: Secondary | ICD-10-CM | POA: Diagnosis not present

## 2021-04-01 DIAGNOSIS — Z451 Encounter for adjustment and management of infusion pump: Secondary | ICD-10-CM | POA: Diagnosis not present

## 2021-04-01 DIAGNOSIS — Z982 Presence of cerebrospinal fluid drainage device: Secondary | ICD-10-CM | POA: Diagnosis not present

## 2021-04-01 DIAGNOSIS — G918 Other hydrocephalus: Secondary | ICD-10-CM | POA: Diagnosis not present

## 2021-04-14 ENCOUNTER — Ambulatory Visit: Payer: Medicare PPO | Admitting: Cardiology

## 2021-04-17 DIAGNOSIS — R300 Dysuria: Secondary | ICD-10-CM | POA: Diagnosis not present

## 2021-04-19 ENCOUNTER — Encounter: Payer: Self-pay | Admitting: Cardiology

## 2021-04-19 ENCOUNTER — Other Ambulatory Visit: Payer: Self-pay | Admitting: *Deleted

## 2021-04-19 MED ORDER — DILTIAZEM HCL ER 90 MG PO CP12: 90.0000 mg | ORAL_CAPSULE | Freq: Two times a day (BID) | ORAL | 3 refills | Status: DC

## 2021-04-20 DIAGNOSIS — K136 Irritative hyperplasia of oral mucosa: Secondary | ICD-10-CM | POA: Diagnosis not present

## 2021-04-29 ENCOUNTER — Ambulatory Visit (INDEPENDENT_AMBULATORY_CARE_PROVIDER_SITE_OTHER): Payer: Medicare PPO | Admitting: Student

## 2021-04-29 ENCOUNTER — Encounter: Payer: Self-pay | Admitting: Student

## 2021-04-29 VITALS — BP 127/80 | HR 102 | Ht 66.0 in | Wt 115.0 lb

## 2021-04-29 DIAGNOSIS — I10 Essential (primary) hypertension: Secondary | ICD-10-CM | POA: Diagnosis not present

## 2021-04-29 DIAGNOSIS — R000 Tachycardia, unspecified: Secondary | ICD-10-CM | POA: Diagnosis not present

## 2021-04-29 DIAGNOSIS — G4731 Primary central sleep apnea: Secondary | ICD-10-CM

## 2021-04-29 NOTE — Progress Notes (Signed)
? ?Virtual Visit via Telephone Note  ? ?This visit type was conducted due to national recommendations for restrictions regarding the COVID-19 Pandemic (e.g. social distancing) in an effort to limit this patient's exposure and mitigate transmission in our community.  Due to his co-morbid illnesses, this patient is at least at moderate risk for complications without adequate follow up.  This format is felt to be most appropriate for this patient at this time.  The patient did not have access to video technology/had technical difficulties with video requiring transitioning to audio format only (telephone).  All issues noted in this document were discussed and addressed.  No physical exam could be performed with this format.  Please refer to the patient's chart for his  consent to telehealth for [facility].  ? ? ?Date:  04/29/2021  ? ?ID:  Joshua Logan, DOB 1981/01/09, MRN [id number] ?The patient was identified using 2 identifiers. ? ?Patient Location: Home ?Provider Location: Home Office ? ? ?PCP:  Manon Hilding, MD ?  ?[facility] Providers ?Cardiologist:  Carlyle Dolly, MD    ? ?Evaluation Performed:  Follow-Up Visit ? ?Chief Complaint: 6 month visit ? ?History of Present Illness:   ? ?Joshua Logan is a 41 y.o. male with with past medical history of SVT, HTN, hydrocephalus, central sleep apnea and cerebral palsy who presents for a 13-month follow-up telehealth visit.  ? ?He was last examined by Dr. Harl Bowie in 08/2020 and family members reported he complained of occasional palpitations at times. Therefore, short-acting Cardizem was increased from 30mg  BID to 60mg  BID.  ? ?He was hospitalized at [facility] in 11/2020 for SIRS and symptoms and vitals improved with Rocephin. Given his tachycardia during admission, Cardizem was further titrated to 90mg  BID.  ? ?In talking with the patient today, most history is provided by his mom and he does use a number system to report yes and no. They mention that his  palpitations have overall improved since Cardizem was titrated to 90 mg twice daily and his heart rate has been well controlled when checked at home except when he becomes anxious. His breathing has been stable but he does use 2 L  at night due to having prior desaturations. He is fatigued during the day but no progressive dyspnea on exertion. No specific orthopnea or PND. ? ?The patient does not have symptoms concerning for COVID-19 infection (fever, chills, cough, or new shortness of breath).  ? ? ?Past Medical History:  ?Diagnosis Date  ? Cerebral palsy ([place])   ? with spastic quadriparesis with numerous orthopedic procedures for contractures and fratures  ? Chronic constipation   ? Chronic pulmonary aspiration   ? CSA (central sleep apnea)   ? GERD (gastroesophageal reflux disease)   ? Hydrocephalus ([place])   ? s/p VP shunt  ? Hypoxemia   ? chronic  ? Iron deficiency anemia   ? OSA (obstructive sleep apnea)   ? Protein calorie malnutrition ([place])   ? chronic  ? Tachycardia   ? chronic  ? ?Past Surgical History:  ?Procedure Laterality Date  ? BACK SURGERY    ? [place] RODS PLACED FOR SCOLIOSIS  ? BACLOFEN PUMP PLACEMENT    ? CSF SHUNT    ? EYE SURGERY    ? MULTIPLE  ? HIP SURGERY    ? MULTIPLE  ? INGUINAL HERNIA REPAIR    ? BILATERAL  ? NEPHRECTOMY    ? [place]  ? VENTRICULAR ATRIAL SHUNT    ?  ? ?  Current Meds  ?Medication Sig  ? acetaminophen (TYLENOL) 325 MG tablet Take 2 tablets (650 mg total) by mouth every 6 (six) hours as needed for mild pain or headache (or Fever >/= 101).  ? BACLOFEN IT 1,449 mcg by Intrathecal route daily.  ? cephALEXin (KEFLEX) 250 MG capsule   ? diltiazem (CARDIZEM SR) 90 MG 12 hr capsule Take 1 capsule (90 mg total) by mouth 2 (two) times daily.  ? ketoconazole (NIZORAL) 2 % shampoo Apply 1 application topically as needed.  ? Lacosamide (VIMPAT) 150 MG TABS Take 1 tablet by mouth every morning.  ? lacosamide (VIMPAT) 200 MG TABS tablet Take 200 mg by mouth at bedtime.  ?  lidocaine-prilocaine (EMLA) cream Apply topically.  ? LINZESS 145 MCG CAPS Take 145 mcg by mouth daily.   ? omeprazole (PRILOSEC) 40 MG capsule Take 1 capsule by mouth 2 (two) times daily.  ? polyethylene glycol (MIRALAX / GLYCOLAX) packet Take 8.5 g by mouth daily as needed.  ? RESTASIS 0.05 % ophthalmic emulsion 1 drop 2 (two) times daily.  ? sodium chloride HYPERTONIC 3 % nebulizer solution Take by nebulization 2 (two) times daily.  ? sucralfate (CARAFATE) 1 GM/10ML suspension Take 500 mg by mouth 2 (two) times daily.  ? tamsulosin (FLOMAX) 0.4 MG CAPS capsule Take 0.4 mg by mouth daily.  ? tretinoin (RETIN-A) 0.025 % cream Apply a a thin layer to affected areas nightly 20 min after washing. Start with two nights weekly and increase as tolerated.  ? Urea 40 % LOTN APPLY 1-2 TIMES DAILY TO KERTOSIS PILARIS ON ARMS  ?  ? ?Allergies:   Amoxicillin-pot clavulanate, Sulfamethoxazole-trimethoprim, Doxycycline, Whole blood, Cefazolin, Midazolam hcl, Septra [bactrim], and Vancomycin  ? ?Social History  ? ?Tobacco Use  ? Smoking status: Never  ? Smokeless tobacco: Never  ?Vaping Use  ? Vaping Use: Never used  ?Substance Use Topics  ? Alcohol use: No  ?  Alcohol/week: 0.0 standard drinks  ? Drug use: No  ?  ? ?Family Hx: ?The patient's family history includes Colon cancer in his father; Hypertension in his father; Osteoarthritis in his mother; Osteoporosis in his mother. ? ?ROS:   ?Please see the history of present illness.    ? ?All other systems reviewed and are negative. ? ? ?Prior CV studies:   ?The following studies were reviewed today: ? ?Echocardiogram: 05/2016 ?Study Conclusions  ? ?- Left ventricle: The cavity size was normal. Wall thickness was  ?  normal. Systolic function was vigorous. The estimated ejection  ?  fraction was in the range of 65% to 70%. Wall motion was normal;  ?  there were no regional wall motion abnormalities. Left  ?  ventricular diastolic function parameters were normal.  ?- Mitral valve:  There was trivial regurgitation.  ?- Right atrium: Central venous pressure (est): 3 mm Hg.  ?- Tricuspid valve: There was trivial regurgitation.  ?- Pulmonary arteries: Systolic pressure could not be accurately  ?  estimated.  ?- Pericardium, extracardiac: A probable small pericardial effusion  ?  with organization was identified circumferential to the heart.  ? ?Labs/Other Tests and Data Reviewed:   ? ?EKG:  No ECG reviewed. ? ?Recent Labs: ?12/06/2020: ALT 15 ?12/11/2020: BUN 22; Creatinine, Ser 0.86; Hemoglobin 14.8; Platelets 207; Potassium 3.9; Sodium 139  ? ?Recent Lipid Panel ?No results found for: CHOL, TRIG, HDL, CHOLHDL, LDLCALC, LDLDIRECT ? ?Wt Readings from Last 3 Encounters:  ?04/29/21 115 lb (52.2 kg)  ?12/06/20 117 lb 15.1 oz (  53.5 kg)  ?09/10/20 118 lb (53.5 kg)  ?  ? ?Objective:   ? ?Vital Signs:  BP 127/80   Pulse (!) 102   Ht 5\' 6"  (1.676 m)   Wt 115 lb (52.2 kg)   BMI 18.56 kg/m?   ? ?Physical Exam not performed as this was a Virtual Visit ? ?ASSESSMENT & PLAN:   ? ?1. SVT/Sinus Tachycardia ?- His palpitations have improved and his heart rate has been under better control since Cardizem was titrated to 90 mg twice daily. Will continue at this dose for now. I encouraged them to make [place] aware if he develops any worsening symptoms as we could arrange for a repeat echocardiogram as previously discussed but will hold off on this for now given stability in his symptoms. ? ?2. HTN ?- His blood pressure has been well-controlled when checked at home, at 127/80 on most recent check this morning. Continue Cardizem 90 mg twice daily. ? ?3. Sleep Apnea ?- He is using 2L [place] at night and his desaturations have improved. Followed by Pulmonology.  ? ? ?Time:   ?Today, I have spent 22 minutes with the patient with telehealth technology discussing the above problems.   ? ? ?Medication Adjustments/Labs and Tests Ordered: ?Current medicines are reviewed at length with the patient today.  Concerns regarding  medicines are outlined above.  ? ?Tests Ordered: ?No orders of the defined types were placed in this encounter. ? ? ?Medication Changes: ?No orders of the defined types were placed in this encounter. ? ? ?Follow Up: 6 mo

## 2021-04-29 NOTE — Patient Instructions (Signed)
Medication Instructions:  Your physician recommends that you continue on your current medications as directed. Please refer to the Current Medication list given to you today.  *If you need a refill on your cardiac medications before your next appointment, please call your pharmacy*   Lab Work: NONE   If you have labs (blood work) drawn today and your tests are completely normal, you will receive your results only by: MyChart Message (if you have MyChart) OR A paper copy in the mail If you have any lab test that is abnormal or we need to change your treatment, we will call you to review the results.   Testing/Procedures: NONE    Follow-Up: At [facility], you and your health needs are our priority.  As part of our continuing mission to provide you with exceptional heart care, we have created designated Provider Care Teams.  These Care Teams include your primary Cardiologist (physician) and Advanced Practice Providers (APPs -  Physician Assistants and Nurse Practitioners) who all work together to provide you with the care you need, when you need it.  We recommend signing up for the patient portal called "MyChart".  Sign up information is provided on this After Visit Summary.  MyChart is used to connect with patients for Virtual Visits (Telemedicine).  Patients are able to view lab/test results, encounter notes, upcoming appointments, etc.  Non-urgent messages can be sent to your provider as well.   To learn more about what you can do with MyChart, go to [email or web address].    Your next appointment:   6 month(s)  The format for your next appointment:   In Person  Provider:   Jonathan Branch, MD    Other Instructions Thank you for choosing [facility]!    Important Information About Sugar       

## 2021-05-11 DIAGNOSIS — G918 Other hydrocephalus: Secondary | ICD-10-CM | POA: Diagnosis not present

## 2021-05-11 DIAGNOSIS — Z451 Encounter for adjustment and management of infusion pump: Secondary | ICD-10-CM | POA: Diagnosis not present

## 2021-05-11 DIAGNOSIS — G800 Spastic quadriplegic cerebral palsy: Secondary | ICD-10-CM | POA: Diagnosis not present

## 2021-05-11 DIAGNOSIS — N319 Neuromuscular dysfunction of bladder, unspecified: Secondary | ICD-10-CM | POA: Diagnosis not present

## 2021-05-11 DIAGNOSIS — K5900 Constipation, unspecified: Secondary | ICD-10-CM | POA: Diagnosis not present

## 2021-05-11 DIAGNOSIS — G4731 Primary central sleep apnea: Secondary | ICD-10-CM | POA: Diagnosis not present

## 2021-05-11 DIAGNOSIS — R252 Cramp and spasm: Secondary | ICD-10-CM | POA: Diagnosis not present

## 2021-05-11 DIAGNOSIS — Z87440 Personal history of urinary (tract) infections: Secondary | ICD-10-CM | POA: Diagnosis not present

## 2021-05-11 DIAGNOSIS — K219 Gastro-esophageal reflux disease without esophagitis: Secondary | ICD-10-CM | POA: Diagnosis not present

## 2021-05-11 DIAGNOSIS — G808 Other cerebral palsy: Secondary | ICD-10-CM | POA: Diagnosis not present

## 2021-06-03 ENCOUNTER — Encounter: Payer: Self-pay | Admitting: Podiatry

## 2021-06-03 ENCOUNTER — Ambulatory Visit (INDEPENDENT_AMBULATORY_CARE_PROVIDER_SITE_OTHER): Payer: Medicare PPO | Admitting: Podiatry

## 2021-06-03 DIAGNOSIS — B351 Tinea unguium: Secondary | ICD-10-CM

## 2021-06-03 DIAGNOSIS — M79674 Pain in right toe(s): Secondary | ICD-10-CM

## 2021-06-03 DIAGNOSIS — M79675 Pain in left toe(s): Secondary | ICD-10-CM

## 2021-06-04 NOTE — Progress Notes (Signed)
Subjective:   Patient ID: Joshua Logan, male   DOB: 41 y.o.   MRN: [id number]   HPI Patient presents with a caregiver with nail disease 1-5 both feet that they had trouble taking care of and the fifth nail right partially detached secondary to injury at home.  Patient is in a wheelchair and does have cerebral palsy   Review of Systems  All other systems reviewed and are negative.      Objective:  Physical Exam Vitals and nursing note reviewed.  Constitutional:      Appearance: He is well-developed.  Pulmonary:     Effort: Pulmonary effort is normal.  Musculoskeletal:        General: Normal range of motion.  Skin:    General: Skin is warm.  Neurological:     Mental Status: He is alert.    Circulatory status is adequate for his current medical condition neurological diminished diminished range of motion diminished muscle strength secondary to being in a wheelchair at all times.  He is not communicative and I did communicate through his mother and patient was noted to have nail disease 1-5 both feet that are incurvated they are sore and they cannot take care of them.  The right fifth nail is partially detached     Assessment:  Patient who is in wheelchair who has nail disease that is hard for them to take care of with partially detached fifth of all nails are moderately tender     Plan:  H&P reviewed and then I went ahead debrided nailbeds 1-5 both feet I did not note any drainage or any pathology associated with the fifth nail and it should heal uneventfully

## 2021-06-15 DIAGNOSIS — R300 Dysuria: Secondary | ICD-10-CM | POA: Diagnosis not present

## 2021-06-30 DIAGNOSIS — Z79899 Other long term (current) drug therapy: Secondary | ICD-10-CM | POA: Diagnosis not present

## 2021-06-30 DIAGNOSIS — G800 Spastic quadriplegic cerebral palsy: Secondary | ICD-10-CM | POA: Diagnosis not present

## 2021-06-30 DIAGNOSIS — R252 Cramp and spasm: Secondary | ICD-10-CM | POA: Diagnosis not present

## 2021-06-30 DIAGNOSIS — G918 Other hydrocephalus: Secondary | ICD-10-CM | POA: Diagnosis not present

## 2021-06-30 DIAGNOSIS — Z451 Encounter for adjustment and management of infusion pump: Secondary | ICD-10-CM | POA: Diagnosis not present

## 2021-07-26 DIAGNOSIS — L858 Other specified epidermal thickening: Secondary | ICD-10-CM | POA: Diagnosis not present

## 2021-07-26 DIAGNOSIS — L720 Epidermal cyst: Secondary | ICD-10-CM | POA: Diagnosis not present

## 2021-07-26 DIAGNOSIS — L0291 Cutaneous abscess, unspecified: Secondary | ICD-10-CM | POA: Diagnosis not present

## 2021-07-29 DIAGNOSIS — K219 Gastro-esophageal reflux disease without esophagitis: Secondary | ICD-10-CM | POA: Diagnosis not present

## 2021-08-10 DIAGNOSIS — G808 Other cerebral palsy: Secondary | ICD-10-CM | POA: Diagnosis not present

## 2021-08-11 ENCOUNTER — Encounter: Payer: Self-pay | Admitting: Cardiology

## 2021-08-16 DIAGNOSIS — R300 Dysuria: Secondary | ICD-10-CM | POA: Diagnosis not present

## 2021-08-17 DIAGNOSIS — G808 Other cerebral palsy: Secondary | ICD-10-CM | POA: Diagnosis not present

## 2021-08-17 DIAGNOSIS — R252 Cramp and spasm: Secondary | ICD-10-CM | POA: Diagnosis not present

## 2021-08-18 ENCOUNTER — Other Ambulatory Visit: Payer: Self-pay | Admitting: Cardiology

## 2021-08-25 DIAGNOSIS — N390 Urinary tract infection, site not specified: Secondary | ICD-10-CM | POA: Diagnosis not present

## 2021-08-25 DIAGNOSIS — N319 Neuromuscular dysfunction of bladder, unspecified: Secondary | ICD-10-CM | POA: Diagnosis not present

## 2021-08-25 DIAGNOSIS — K5900 Constipation, unspecified: Secondary | ICD-10-CM | POA: Diagnosis not present

## 2021-08-25 DIAGNOSIS — Z79899 Other long term (current) drug therapy: Secondary | ICD-10-CM | POA: Diagnosis not present

## 2021-08-25 DIAGNOSIS — Z905 Acquired absence of kidney: Secondary | ICD-10-CM | POA: Diagnosis not present

## 2021-09-21 DIAGNOSIS — G40219 Localization-related (focal) (partial) symptomatic epilepsy and epileptic syndromes with complex partial seizures, intractable, without status epilepticus: Secondary | ICD-10-CM | POA: Diagnosis not present

## 2021-09-30 DIAGNOSIS — Z87440 Personal history of urinary (tract) infections: Secondary | ICD-10-CM | POA: Diagnosis not present

## 2021-09-30 DIAGNOSIS — N319 Neuromuscular dysfunction of bladder, unspecified: Secondary | ICD-10-CM | POA: Diagnosis not present

## 2021-09-30 DIAGNOSIS — Z79899 Other long term (current) drug therapy: Secondary | ICD-10-CM | POA: Diagnosis not present

## 2021-09-30 DIAGNOSIS — G4731 Primary central sleep apnea: Secondary | ICD-10-CM | POA: Diagnosis not present

## 2021-09-30 DIAGNOSIS — G40219 Localization-related (focal) (partial) symptomatic epilepsy and epileptic syndromes with complex partial seizures, intractable, without status epilepticus: Secondary | ICD-10-CM | POA: Diagnosis not present

## 2021-09-30 DIAGNOSIS — R252 Cramp and spasm: Secondary | ICD-10-CM | POA: Diagnosis not present

## 2021-09-30 DIAGNOSIS — K219 Gastro-esophageal reflux disease without esophagitis: Secondary | ICD-10-CM | POA: Diagnosis not present

## 2021-09-30 DIAGNOSIS — Z451 Encounter for adjustment and management of infusion pump: Secondary | ICD-10-CM | POA: Diagnosis not present

## 2021-09-30 DIAGNOSIS — K5900 Constipation, unspecified: Secondary | ICD-10-CM | POA: Diagnosis not present

## 2021-10-20 DIAGNOSIS — H47033 Optic nerve hypoplasia, bilateral: Secondary | ICD-10-CM | POA: Diagnosis not present

## 2021-10-20 DIAGNOSIS — Z09 Encounter for follow-up examination after completed treatment for conditions other than malignant neoplasm: Secondary | ICD-10-CM | POA: Diagnosis not present

## 2021-10-20 DIAGNOSIS — H47293 Other optic atrophy, bilateral: Secondary | ICD-10-CM | POA: Diagnosis not present

## 2021-10-20 DIAGNOSIS — H0100A Unspecified blepharitis right eye, upper and lower eyelids: Secondary | ICD-10-CM | POA: Diagnosis not present

## 2021-10-20 DIAGNOSIS — H5005 Alternating esotropia: Secondary | ICD-10-CM | POA: Diagnosis not present

## 2021-10-20 DIAGNOSIS — H5501 Congenital nystagmus: Secondary | ICD-10-CM | POA: Diagnosis not present

## 2021-10-22 DIAGNOSIS — Z982 Presence of cerebrospinal fluid drainage device: Secondary | ICD-10-CM | POA: Diagnosis not present

## 2021-10-22 DIAGNOSIS — G9389 Other specified disorders of brain: Secondary | ICD-10-CM | POA: Diagnosis not present

## 2021-10-22 DIAGNOSIS — Z09 Encounter for follow-up examination after completed treatment for conditions other than malignant neoplasm: Secondary | ICD-10-CM | POA: Diagnosis not present

## 2021-10-22 DIAGNOSIS — G319 Degenerative disease of nervous system, unspecified: Secondary | ICD-10-CM | POA: Diagnosis not present

## 2021-11-09 DIAGNOSIS — R252 Cramp and spasm: Secondary | ICD-10-CM | POA: Diagnosis not present

## 2021-11-10 ENCOUNTER — Ambulatory Visit: Payer: Medicare PPO | Attending: Cardiology | Admitting: Cardiology

## 2021-11-10 ENCOUNTER — Encounter: Payer: Self-pay | Admitting: Cardiology

## 2021-11-10 VITALS — BP 122/86 | HR 88 | Ht 65.0 in | Wt 114.0 lb

## 2021-11-10 DIAGNOSIS — I10 Essential (primary) hypertension: Secondary | ICD-10-CM | POA: Diagnosis not present

## 2021-11-10 DIAGNOSIS — R002 Palpitations: Secondary | ICD-10-CM | POA: Diagnosis not present

## 2021-11-10 DIAGNOSIS — Z79899 Other long term (current) drug therapy: Secondary | ICD-10-CM

## 2021-11-10 NOTE — Progress Notes (Signed)
Clinical Summary [name] is a 41 y.o.male seen today for follow up of the following medical problems.    1. Paroxysmal supraventricular tachycardia/Chronic tachycardia - discovered initially during workup for abnormal "spells" the patient was having per notes with cardiac monitoring. - prior echo shows  normal LVEF with no other significant abnormalities - tried on metoprolol however per report seemed to have increased frequency of episodes.   - he was later found to be having seizure activity during these spells by [facility], with improved episodes with alterations of his seizure therapy. The tachycardia may have been provoked by this seizure activity.       most recently on dilt 90mg  bid. Has done well as far as bp's and palpitations.  - gum overgrowth, has appt next week with peridontis -      2. Cerebal palsy - followed at [facility] neuro       3. HTN - compliant with meds Past Medical History:  Diagnosis Date   Cerebral palsy (H[facility])    with spastic quadriparesis with numerous orthopedic procedures for contractures and fratures   Chronic constipation    Chronic pulmonary aspiration    CSA (central sleep apnea)    GERD (gastroesophageal reflux disease)    Hydrocephalus (H[facility])    s/p VP shunt   Hypoxemia    chronic   Iron deficiency anemia    OSA (obstructive sleep apnea)    Protein calorie malnutrition (H[facility])    chronic   Tachycardia    chronic     Allergies  Allergen Reactions   Amoxicillin-Pot Clavulanate Other (See Comments) and Rash    blisters Blisters. Pt can take Amoxicillin, but NOT Augmentin    Sulfamethoxazole-Trimethoprim Rash   Doxycycline Other (See Comments)    Other reaction(s): Other (See Comments) Bad reflux Bad reflux    Whole Blood    Cefazolin Rash   Midazolam Hcl Other (See Comments)    Un-reactive pupils after surgery   Septra [Bactrim] Hives   Vancomycin Rash     Current Outpatient Medications  Medication Sig Dispense  Refill   acetaminophen (TYLENOL) 325 MG tablet Take 2 tablets (650 mg total) by mouth every 6 (six) hours as needed for mild pain or headache (or Fever >/= 101). 12 tablet 0   BACLOFEN IT 1,449 mcg by Intrathecal route daily.     cephALEXin (KEFLEX) 250 MG capsule      diltiazem (CARDIZEM SR) 90 MG 12 hr capsule TAKE 1 CAPSULE(90 MG) BY MOUTH TWICE DAILY 60 capsule 3   ketoconazole (NIZORAL) 2 % shampoo Apply 1 application topically as needed.     Lacosamide (VIMPAT) 150 MG TABS Take 1 tablet by mouth every morning.     lacosamide (VIMPAT) 200 MG TABS tablet Take 200 mg by mouth at bedtime.     lidocaine-prilocaine (EMLA) cream Apply topically.     LINZESS 145 MCG CAPS Take 145 mcg by mouth daily.      omeprazole (PRILOSEC) 40 MG capsule Take 1 capsule by mouth 2 (two) times daily.     polyethylene glycol (MIRALAX / GLYCOLAX) packet Take 8.5 g by mouth daily as needed.     RESTASIS 0.05 % ophthalmic emulsion 1 drop 2 (two) times daily.     sodium chloride HYPERTONIC 3 % nebulizer solution Take by nebulization 2 (two) times daily.     sucralfate (CARAFATE) 1 GM/10ML suspension Take 500 mg by mouth 2 (two) times daily.     tamsulosin (  FLOMAX) 0.4 MG CAPS capsule Take 0.4 mg by mouth daily.     tretinoin (RETIN-A) 0.025 % cream Apply a a thin layer to affected areas nightly 20 min after washing. Start with two nights weekly and increase as tolerated.     Urea 40 % LOTN APPLY 1-2 TIMES DAILY TO KERTOSIS PILARIS ON ARMS     No current facility-administered medications for this visit.     Past Surgical History:  Procedure Laterality Date   BACK SURGERY     H[place] RODS PLACED FOR SCOLIOSIS   BACLOFEN PUMP PLACEMENT     CSF SHUNT     EYE SURGERY     MULTIPLE   HIP SURGERY     MULTIPLE   INGUINAL HERNIA REPAIR     BILATERAL   NEPHRECTOMY     [facility]   VENTRICULAR ATRIAL SHUNT       Allergies  Allergen Reactions   Amoxicillin-Pot Clavulanate Other (See Comments) and Rash     blisters Blisters. Pt can take Amoxicillin, but NOT Augmentin    Sulfamethoxazole-Trimethoprim Rash   Doxycycline Other (See Comments)    Other reaction(s): Other (See Comments) Bad reflux Bad reflux    Whole Blood    Cefazolin Rash   Midazolam Hcl Other (See Comments)    Un-reactive pupils after surgery   Septra [Bactrim] Hives   Vancomycin Rash      Family History  Problem Relation Age of Onset   Osteoporosis Mother    Osteoarthritis Mother        in her back   Colon cancer Father    Hypertension Father      Social History Joshua Logan reports that he has never smoked. He has never used smokeless tobacco. Joshua Logan reports no history of alcohol use.   Review of Systems CONSTITUTIONAL: No weight loss, fever, chills, weakness or fatigue.  HEENT: Eyes: No visual loss, blurred vision, double vision or yellow sclerae.No hearing loss, sneezing, congestion, runny nose or sore throat.  SKIN: No rash or itching.  CARDIOVASCULAR: per hpi RESPIRATORY: No shortness of breath, cough or sputum.  GASTROINTESTINAL: No anorexia, nausea, vomiting or diarrhea. No abdominal pain or blood.  GENITOURINARY: No burning on urination, no polyuria NEUROLOGICAL: chornic spasticity  MUSCULOSKELETAL: No muscle, back pain, joint pain or stiffness.  LYMPHATICS: No enlarged nodes. No history of splenectomy.  PSYCHIATRIC: No history of depression or anxiety.  ENDOCRINOLOGIC: No reports of sweating, cold or heat intolerance. No polyuria or polydipsia.  Marland Kitchen   Physical Examination Today's Vitals   11/10/21 1023  BP: 122/86  Pulse: 88  Weight: 114 lb (51.7 kg)  Height: 5\' 5"  (1.651 m)   Body mass index is 18.97 kg/m.  Gen: resting comfortably, no acute distress HEENT: no scleral icterus, pupils equal round and reactive, no palptable cervical adenopathy,  CV: RRR, no m/r/g, no jvd Resp: Clear to auscultation bilaterally GI: abdomen is soft, non-tender, non-distended, normal bowel sounds, no  hepatosplenomegaly MSK: extremities are warm, no edema.  Skin: warm, no rash Psych: appropriate affect   Diagnostic Studies  02/2012 Echo  LVEF 16-60%, grade I diastolic dysfunction, normal LA size,    05/2016 echo Study Conclusions   - Left ventricle: The cavity size was normal. Wall thickness was    normal. Systolic function was vigorous. The estimated ejection    fraction was in the range of 65% to 70%. Wall motion was normal;    there were no regional wall motion abnormalities. Left  ventricular diastolic function parameters were normal.  - Mitral valve: There was trivial regurgitation.  - Right atrium: Central venous pressure (est): 3 mm Hg.  - Tricuspid valve: There was trivial regurgitation.  - Pulmonary arteries: Systolic pressure could not be accurately    estimated.  - Pericardium, extracardiac: A probable small pericardial effusion    with organization was identified circumferential to the heart.   Assessment and Plan  1. Tachycardia/PSVT - has done well on dilt 90mg  bid however there are some concerns about possible gingival hyperplasia. See peridontis soon, if confirmed then would change to coreg 6.25mg  bid as he is on for both palpitations and HTN - EKG today shows NSR   2. HTN - at goal - possible change to coreg as reported above pending peridontis appt - would choose beta blocker for HTN given he also has palpitations.       F/u 6 months [place]  Arnoldo Lenis, M.D.

## 2021-11-10 NOTE — Patient Instructions (Signed)
Medication Instructions:  Your physician recommends that you continue on your current medications as directed. Please refer to the Current Medication list given to you today.  *If you need a refill on your cardiac medications before your next appointment, please call your pharmacy*   Lab Work: Your physician recommends that you return for lab work in: F[place] (Lipid)   If you have labs (blood work) drawn today and your tests are completely normal, you will receive your results only by: [place] (if you have [place]) OR A paper copy in the mail If you have any lab test that is abnormal or we need to change your treatment, we will call you to review the results.   Testing/Procedures: NONE    Follow-Up: At [facility], you and your health needs are our priority.  As part of our continuing mission to provide you with exceptional heart care, we have created designated Provider Care Teams.  These Care Teams include your primary Cardiologist (physician) and Advanced Practice Providers (APPs -  Physician Assistants and Nurse Practitioners) who all work together to provide you with the care you need, when you need it.  We recommend signing up for the patient portal called "MyChart".  Sign up information is provided on this After Visit Summary.  MyChart is used to connect with patients for Virtual Visits (Telemedicine).  Patients are able to view lab/test results, encounter notes, upcoming appointments, etc.  Non-urgent messages can be sent to your provider as well.   To learn more about what you can do with MyChart, go to [email or web address].    Your next appointment:   6 month(s)  The format for your next appointment:   In Person  Provider:   Carlyle Dolly, MD    Other Instructions Thank you for choosing [facility]!    Important Information About Sugar

## 2021-11-16 ENCOUNTER — Encounter: Payer: Self-pay | Admitting: Cardiology

## 2021-11-17 NOTE — Telephone Encounter (Signed)
Thanks so much for update, we will stay on current medication  Joshua Abts MD

## 2021-12-03 ENCOUNTER — Other Ambulatory Visit (HOSPITAL_COMMUNITY)
Admission: RE | Admit: 2021-12-03 | Discharge: 2021-12-03 | Disposition: A | Discharge status: Home / Self Care | Payer: Medicare PPO | Source: Ambulatory Visit | Attending: Cardiology | Admitting: Cardiology

## 2021-12-03 DIAGNOSIS — Z79899 Other long term (current) drug therapy: Secondary | ICD-10-CM | POA: Diagnosis not present

## 2021-12-03 LAB — LIPID PANEL
Cholesterol: 156 mg/dL (ref 0–200)
HDL: 51 mg/dL (ref >40)
LDL Cholesterol: 85 mg/dL (ref 0–99)
Total CHOL/HDL Ratio: 3.1 RATIO
Triglycerides: 100 mg/dL (ref <150)
VLDL: 20 mg/dL (ref 0–40)

## 2021-12-06 ENCOUNTER — Telehealth: Payer: Self-pay

## 2021-12-06 NOTE — Telephone Encounter (Signed)
Patients guardian notified (ok per DPR). Pt's guardian had no questions or concerns at this time.

## 2021-12-06 NOTE — Telephone Encounter (Signed)
-----   Message from Antoine Poche, MD sent at 12/06/2021  9:00 AM EST ----- Cholesterol numbers look good  Dominga Ferry MD

## 2021-12-11 ENCOUNTER — Other Ambulatory Visit: Payer: Self-pay | Admitting: Cardiology

## 2021-12-22 DIAGNOSIS — R300 Dysuria: Secondary | ICD-10-CM | POA: Diagnosis not present

## 2021-12-28 DIAGNOSIS — Z451 Encounter for adjustment and management of infusion pump: Secondary | ICD-10-CM | POA: Diagnosis not present

## 2021-12-28 DIAGNOSIS — G4731 Primary central sleep apnea: Secondary | ICD-10-CM | POA: Diagnosis not present

## 2021-12-28 DIAGNOSIS — R252 Cramp and spasm: Secondary | ICD-10-CM | POA: Diagnosis not present

## 2021-12-28 DIAGNOSIS — G808 Other cerebral palsy: Secondary | ICD-10-CM | POA: Diagnosis not present

## 2021-12-28 DIAGNOSIS — G800 Spastic quadriplegic cerebral palsy: Secondary | ICD-10-CM | POA: Diagnosis not present

## 2021-12-28 DIAGNOSIS — G40909 Epilepsy, unspecified, not intractable, without status epilepticus: Secondary | ICD-10-CM | POA: Diagnosis not present

## 2021-12-28 DIAGNOSIS — Z79899 Other long term (current) drug therapy: Secondary | ICD-10-CM | POA: Diagnosis not present

## 2022-02-03 DIAGNOSIS — G4731 Primary central sleep apnea: Secondary | ICD-10-CM | POA: Diagnosis not present

## 2022-02-03 DIAGNOSIS — J9611 Chronic respiratory failure with hypoxia: Secondary | ICD-10-CM | POA: Diagnosis not present

## 2022-02-03 DIAGNOSIS — G809 Cerebral palsy, unspecified: Secondary | ICD-10-CM | POA: Diagnosis not present

## 2022-02-06 DIAGNOSIS — G4731 Primary central sleep apnea: Secondary | ICD-10-CM | POA: Diagnosis not present

## 2022-02-15 DIAGNOSIS — R252 Cramp and spasm: Secondary | ICD-10-CM | POA: Diagnosis not present

## 2022-02-17 DIAGNOSIS — G4731 Primary central sleep apnea: Secondary | ICD-10-CM | POA: Diagnosis not present

## 2022-02-24 DIAGNOSIS — K219 Gastro-esophageal reflux disease without esophagitis: Secondary | ICD-10-CM | POA: Diagnosis not present

## 2022-02-24 DIAGNOSIS — K5901 Slow transit constipation: Secondary | ICD-10-CM | POA: Diagnosis not present

## 2022-02-24 DIAGNOSIS — G808 Other cerebral palsy: Secondary | ICD-10-CM | POA: Diagnosis not present

## 2022-02-24 DIAGNOSIS — R1312 Dysphagia, oropharyngeal phase: Secondary | ICD-10-CM | POA: Diagnosis not present

## 2022-03-09 DIAGNOSIS — G4731 Primary central sleep apnea: Secondary | ICD-10-CM | POA: Diagnosis not present

## 2022-03-31 DIAGNOSIS — G808 Other cerebral palsy: Secondary | ICD-10-CM | POA: Diagnosis not present

## 2022-03-31 DIAGNOSIS — R252 Cramp and spasm: Secondary | ICD-10-CM | POA: Diagnosis not present

## 2022-03-31 DIAGNOSIS — Z451 Encounter for adjustment and management of infusion pump: Secondary | ICD-10-CM | POA: Diagnosis not present

## 2022-03-31 DIAGNOSIS — G918 Other hydrocephalus: Secondary | ICD-10-CM | POA: Diagnosis not present

## 2022-03-31 DIAGNOSIS — G919 Hydrocephalus, unspecified: Secondary | ICD-10-CM | POA: Diagnosis not present

## 2022-03-31 DIAGNOSIS — Z982 Presence of cerebrospinal fluid drainage device: Secondary | ICD-10-CM | POA: Diagnosis not present

## 2022-04-06 DIAGNOSIS — G4731 Primary central sleep apnea: Secondary | ICD-10-CM | POA: Diagnosis not present

## 2022-04-07 DIAGNOSIS — G4731 Primary central sleep apnea: Secondary | ICD-10-CM | POA: Diagnosis not present

## 2022-04-08 ENCOUNTER — Other Ambulatory Visit: Payer: Self-pay | Admitting: Cardiology

## 2022-04-26 DIAGNOSIS — G808 Other cerebral palsy: Secondary | ICD-10-CM | POA: Diagnosis not present

## 2022-04-26 DIAGNOSIS — G40219 Localization-related (focal) (partial) symptomatic epilepsy and epileptic syndromes with complex partial seizures, intractable, without status epilepticus: Secondary | ICD-10-CM | POA: Diagnosis not present

## 2022-05-08 DIAGNOSIS — G4731 Primary central sleep apnea: Secondary | ICD-10-CM | POA: Diagnosis not present

## 2022-05-09 DIAGNOSIS — R509 Fever, unspecified: Secondary | ICD-10-CM | POA: Diagnosis not present

## 2022-05-09 DIAGNOSIS — R300 Dysuria: Secondary | ICD-10-CM | POA: Diagnosis not present

## 2022-05-10 ENCOUNTER — Encounter: Payer: Self-pay | Admitting: Cardiology

## 2022-05-10 ENCOUNTER — Ambulatory Visit: Payer: Medicare PPO | Attending: Cardiology | Admitting: Cardiology

## 2022-05-10 VITALS — BP 127/80 | HR 84 | Ht 65.0 in | Wt 114.0 lb

## 2022-05-10 DIAGNOSIS — R000 Tachycardia, unspecified: Secondary | ICD-10-CM

## 2022-05-10 DIAGNOSIS — I471 Supraventricular tachycardia, unspecified: Secondary | ICD-10-CM | POA: Diagnosis not present

## 2022-05-10 DIAGNOSIS — I10 Essential (primary) hypertension: Secondary | ICD-10-CM

## 2022-05-10 NOTE — Progress Notes (Signed)
Clinical Summary Mr. Dombek is a 42 y.o.male seen today for follow up of the following medical problems.    1. Paroxysmal supraventricular tachycardia/Chronic tachycardia - discovered initially during workup for abnormal "spells" the patient was having per notes with cardiac monitoring. - prior echo shows  normal LVEF with no other significant abnormalities - tried on metoprolol however per report seemed to have increased frequency of episodes.   - he was later found to be having seizure activity during these spells by [facility], with improved episodes with alterations of his seizure therapy. The tachycardia may have been provoked by this seizure activity.       most recently on dilt 90mg  bid. Has done well as far as bp's and palpitations.  - gum overgrowth, has appt next week with peridontis  - phone note 11/16/21 patient had seen dentist and gums not felt significantly abnormal, was ok to stay on dilt   - compliant with meds      2. Cerebal palsy - followed at [facility]       3. HTN - he is compliant with meds   Past Medical History:  Diagnosis Date   Cerebral palsy    with spastic quadriparesis with numerous orthopedic procedures for contractures and fratures   Chronic constipation    Chronic pulmonary aspiration    CSA (central sleep apnea)    GERD (gastroesophageal reflux disease)    Hydrocephalus    s/p VP shunt   Hypoxemia    chronic   Iron deficiency anemia    OSA (obstructive sleep apnea)    Protein calorie malnutrition    chronic   Tachycardia    chronic     Allergies  Allergen Reactions   Amoxicillin-Pot Clavulanate Other (See Comments) and Rash    blisters Blisters. Pt can take Amoxicillin, but NOT Augmentin    Sulfamethoxazole-Trimethoprim Rash   Doxycycline Other (See Comments)    Other reaction(s): Other (See Comments) Bad reflux Bad reflux    Whole Blood    Cefazolin Rash   Midazolam Hcl Other (See Comments)    Un-reactive  pupils after surgery   Septra [Bactrim] Hives   Vancomycin Rash     Current Outpatient Medications  Medication Sig Dispense Refill   BACLOFEN IT 1,449 mcg by Intrathecal route daily.     cephALEXin (KEFLEX) 250 MG capsule      diltiazem (CARDIZEM SR) 90 MG 12 hr capsule TAKE 1 CAPSULE(90 MG) BY MOUTH TWICE DAILY 60 capsule 3   ketoconazole (NIZORAL) 2 % shampoo Apply 1 application topically as needed.     Lacosamide (VIMPAT) 150 MG TABS Take 1 tablet by mouth every morning.     lacosamide (VIMPAT) 200 MG TABS tablet Take 200 mg by mouth at bedtime.     lidocaine-prilocaine (EMLA) cream Apply topically.     LINZESS 145 MCG CAPS Take 145 mcg by mouth daily.      nitrofurantoin (MACRODANTIN) 100 MG capsule Take by mouth.     omeprazole (PRILOSEC) 40 MG capsule Take 1 capsule by mouth 2 (two) times daily.     polyethylene glycol (MIRALAX / GLYCOLAX) packet Take 8.5 g by mouth daily as needed.     RESTASIS 0.05 % ophthalmic emulsion 1 drop 2 (two) times daily.     sodium chloride HYPERTONIC 3 % nebulizer solution Take by nebulization 2 (two) times daily.     sucralfate (CARAFATE) 1 GM/10ML suspension Take 500 mg by mouth 2 (two) times  daily.     tamsulosin (FLOMAX) 0.4 MG CAPS capsule Take 0.4 mg by mouth daily.     tretinoin (RETIN-A) 0.025 % cream Apply a a thin layer to affected areas nightly 20 min after washing. Start with two nights weekly and increase as tolerated.     Urea 40 % LOTN APPLY 1-2 TIMES DAILY TO KERTOSIS PILARIS ON ARMS     No current facility-administered medications for this visit.     Past Surgical History:  Procedure Laterality Date   BACK SURGERY     [place] RODS PLACED FOR SCOLIOSIS   BACLOFEN PUMP PLACEMENT     CSF SHUNT     EYE SURGERY     MULTIPLE   HIP SURGERY     MULTIPLE   INGUINAL HERNIA REPAIR     BILATERAL   NEPHRECTOMY     [facility]   VENTRICULAR ATRIAL SHUNT       Allergies  Allergen Reactions   Amoxicillin-Pot Clavulanate Other  (See Comments) and Rash    blisters Blisters. Pt can take Amoxicillin, but NOT Augmentin    Sulfamethoxazole-Trimethoprim Rash   Doxycycline Other (See Comments)    Other reaction(s): Other (See Comments) Bad reflux Bad reflux    Whole Blood    Cefazolin Rash   Midazolam Hcl Other (See Comments)    Un-reactive pupils after surgery   Septra [Bactrim] Hives   Vancomycin Rash      Family History  Problem Relation Age of Onset   Osteoporosis Mother    Osteoarthritis Mother        in her back   Colon cancer Father    Hypertension Father      Social History Mr. Satter reports that he has never smoked. He has never used smokeless tobacco. Mr. Simones reports no history of alcohol use.   Review of Systems CONSTITUTIONAL: No weight loss, fever, chills, weakness or fatigue.  HEENT: Eyes: No visual loss, blurred vision, double vision or yellow sclerae.No hearing loss, sneezing, congestion, runny nose or sore throat.  SKIN: No rash or itching.  CARDIOVASCULAR: per hpi RESPIRATORY: No shortness of breath, cough or sputum.  GASTROINTESTINAL: No anorexia, nausea, vomiting or diarrhea. No abdominal pain or blood.  GENITOURINARY: No burning on urination, no polyuria NEUROLOGICAL: No headache, dizziness, syncope, paralysis, ataxia, numbness or tingling in the extremities. No change in bowel or bladder control.  MUSCULOSKELETAL: No muscle, back pain, joint pain or stiffness.  LYMPHATICS: No enlarged nodes. No history of splenectomy.  PSYCHIATRIC: No history of depression or anxiety.  ENDOCRINOLOGIC: No reports of sweating, cold or heat intolerance. No polyuria or polydipsia.  Marland Kitchen   Physical Examination Vitals:   05/10/22 1118 05/10/22 1234  BP: (!) 130/90 127/80  Pulse: 84   SpO2: 95%    Filed Weights   05/10/22 1118  Weight: 114 lb (51.7 kg)    Gen: resting comfortably, no acute distress HEENT: no scleral icterus, pupils equal round and reactive, no palptable cervical  adenopathy,  CV: RRR, no mrg, no jvd Resp: Clear to auscultation bilaterally GI: abdomen is soft, non-tender, non-distended, normal bowel sounds, no hepatosplenomegaly MSK: extremities are warm, no edema.  Skin: warm, no rash Neuro:  no focal deficits Psych: appropriate affect   Diagnostic Studies  02/2012 Echo  LVEF 60-65%, grade I diastolic dysfunction, normal LA size,    05/2016 echo Study Conclusions   - Left ventricle: The cavity size was normal. Wall thickness was    normal. Systolic function was vigorous.  The estimated ejection    fraction was in the range of 65% to 70%. Wall motion was normal;    there were no regional wall motion abnormalities. Left    ventricular diastolic function parameters were normal.  - Mitral valve: There was trivial regurgitation.  - Right atrium: Central venous pressure (est): 3 mm Hg.  - Tricuspid valve: There was trivial regurgitation.  - Pulmonary arteries: Systolic pressure could not be accurately    estimated.  - Pericardium, extracardiac: A probable small pericardial effusion    with organization was identified circumferential to the heart.   Assessment and Plan   1. Tachycardia/PSVT - has done well on d PbdQDL$  bid - no recent issues, continue current meds   2. HTN -bp is at goal, on dilt for both HTN and PSVT - continue current meds   F/u 1 year     Antoine Poche, M.D.

## 2022-05-10 NOTE — Patient Instructions (Addendum)

## 2022-05-16 DIAGNOSIS — G919 Hydrocephalus, unspecified: Secondary | ICD-10-CM | POA: Diagnosis not present

## 2022-05-16 DIAGNOSIS — Z451 Encounter for adjustment and management of infusion pump: Secondary | ICD-10-CM | POA: Diagnosis not present

## 2022-05-16 DIAGNOSIS — R252 Cramp and spasm: Secondary | ICD-10-CM | POA: Diagnosis not present

## 2022-05-16 DIAGNOSIS — Z982 Presence of cerebrospinal fluid drainage device: Secondary | ICD-10-CM | POA: Diagnosis not present

## 2022-05-22 DIAGNOSIS — Z882 Allergy status to sulfonamides status: Secondary | ICD-10-CM | POA: Diagnosis not present

## 2022-05-22 DIAGNOSIS — R569 Unspecified convulsions: Secondary | ICD-10-CM | POA: Diagnosis not present

## 2022-05-22 DIAGNOSIS — N3000 Acute cystitis without hematuria: Secondary | ICD-10-CM | POA: Diagnosis not present

## 2022-05-22 DIAGNOSIS — Z79899 Other long term (current) drug therapy: Secondary | ICD-10-CM | POA: Diagnosis not present

## 2022-05-22 DIAGNOSIS — J9811 Atelectasis: Secondary | ICD-10-CM | POA: Diagnosis not present

## 2022-05-22 DIAGNOSIS — Z881 Allergy status to other antibiotic agents status: Secondary | ICD-10-CM | POA: Diagnosis not present

## 2022-05-22 DIAGNOSIS — G809 Cerebral palsy, unspecified: Secondary | ICD-10-CM | POA: Diagnosis not present

## 2022-05-22 DIAGNOSIS — J40 Bronchitis, not specified as acute or chronic: Secondary | ICD-10-CM | POA: Diagnosis not present

## 2022-05-22 DIAGNOSIS — R059 Cough, unspecified: Secondary | ICD-10-CM | POA: Diagnosis not present

## 2022-05-22 DIAGNOSIS — Z792 Long term (current) use of antibiotics: Secondary | ICD-10-CM | POA: Diagnosis not present

## 2022-05-22 DIAGNOSIS — Z1152 Encounter for screening for COVID-19: Secondary | ICD-10-CM | POA: Diagnosis not present

## 2022-05-22 DIAGNOSIS — Z993 Dependence on wheelchair: Secondary | ICD-10-CM | POA: Diagnosis not present

## 2022-05-22 DIAGNOSIS — R9431 Abnormal electrocardiogram [ECG] [EKG]: Secondary | ICD-10-CM | POA: Diagnosis not present

## 2022-05-22 DIAGNOSIS — R051 Acute cough: Secondary | ICD-10-CM | POA: Diagnosis not present

## 2022-05-22 DIAGNOSIS — N3289 Other specified disorders of bladder: Secondary | ICD-10-CM | POA: Diagnosis not present

## 2022-05-22 DIAGNOSIS — R339 Retention of urine, unspecified: Secondary | ICD-10-CM | POA: Diagnosis not present

## 2022-05-22 DIAGNOSIS — N390 Urinary tract infection, site not specified: Secondary | ICD-10-CM | POA: Diagnosis not present

## 2022-05-22 DIAGNOSIS — R0981 Nasal congestion: Secondary | ICD-10-CM | POA: Diagnosis not present

## 2022-05-22 DIAGNOSIS — R509 Fever, unspecified: Secondary | ICD-10-CM | POA: Diagnosis not present

## 2022-05-22 DIAGNOSIS — Z880 Allergy status to penicillin: Secondary | ICD-10-CM | POA: Diagnosis not present

## 2022-05-23 DIAGNOSIS — G800 Spastic quadriplegic cerebral palsy: Secondary | ICD-10-CM | POA: Diagnosis not present

## 2022-05-23 DIAGNOSIS — B965 Pseudomonas (aeruginosa) (mallei) (pseudomallei) as the cause of diseases classified elsewhere: Secondary | ICD-10-CM | POA: Diagnosis not present

## 2022-05-23 DIAGNOSIS — R569 Unspecified convulsions: Secondary | ICD-10-CM | POA: Diagnosis not present

## 2022-05-23 DIAGNOSIS — N3000 Acute cystitis without hematuria: Secondary | ICD-10-CM | POA: Diagnosis not present

## 2022-05-23 DIAGNOSIS — G809 Cerebral palsy, unspecified: Secondary | ICD-10-CM | POA: Diagnosis not present

## 2022-05-23 DIAGNOSIS — Z792 Long term (current) use of antibiotics: Secondary | ICD-10-CM | POA: Diagnosis not present

## 2022-05-23 DIAGNOSIS — R339 Retention of urine, unspecified: Secondary | ICD-10-CM | POA: Diagnosis not present

## 2022-05-23 DIAGNOSIS — R9431 Abnormal electrocardiogram [ECG] [EKG]: Secondary | ICD-10-CM | POA: Diagnosis not present

## 2022-05-23 DIAGNOSIS — Z79899 Other long term (current) drug therapy: Secondary | ICD-10-CM | POA: Diagnosis not present

## 2022-05-23 DIAGNOSIS — N3289 Other specified disorders of bladder: Secondary | ICD-10-CM | POA: Diagnosis not present

## 2022-05-23 DIAGNOSIS — Z993 Dependence on wheelchair: Secondary | ICD-10-CM | POA: Diagnosis not present

## 2022-05-24 DIAGNOSIS — Z993 Dependence on wheelchair: Secondary | ICD-10-CM | POA: Diagnosis not present

## 2022-05-24 DIAGNOSIS — N3289 Other specified disorders of bladder: Secondary | ICD-10-CM | POA: Diagnosis not present

## 2022-05-24 DIAGNOSIS — Z792 Long term (current) use of antibiotics: Secondary | ICD-10-CM | POA: Diagnosis not present

## 2022-05-24 DIAGNOSIS — Z79899 Other long term (current) drug therapy: Secondary | ICD-10-CM | POA: Diagnosis not present

## 2022-05-24 DIAGNOSIS — R9431 Abnormal electrocardiogram [ECG] [EKG]: Secondary | ICD-10-CM | POA: Diagnosis not present

## 2022-05-24 DIAGNOSIS — B965 Pseudomonas (aeruginosa) (mallei) (pseudomallei) as the cause of diseases classified elsewhere: Secondary | ICD-10-CM | POA: Diagnosis not present

## 2022-05-24 DIAGNOSIS — G800 Spastic quadriplegic cerebral palsy: Secondary | ICD-10-CM | POA: Diagnosis not present

## 2022-05-24 DIAGNOSIS — R339 Retention of urine, unspecified: Secondary | ICD-10-CM | POA: Diagnosis not present

## 2022-05-24 DIAGNOSIS — N3000 Acute cystitis without hematuria: Secondary | ICD-10-CM | POA: Diagnosis not present

## 2022-05-24 DIAGNOSIS — R569 Unspecified convulsions: Secondary | ICD-10-CM | POA: Diagnosis not present

## 2022-05-24 DIAGNOSIS — G809 Cerebral palsy, unspecified: Secondary | ICD-10-CM | POA: Diagnosis not present

## 2022-05-25 DIAGNOSIS — R569 Unspecified convulsions: Secondary | ICD-10-CM | POA: Diagnosis not present

## 2022-05-25 DIAGNOSIS — Z79899 Other long term (current) drug therapy: Secondary | ICD-10-CM | POA: Diagnosis not present

## 2022-05-25 DIAGNOSIS — R339 Retention of urine, unspecified: Secondary | ICD-10-CM | POA: Diagnosis not present

## 2022-05-25 DIAGNOSIS — Z993 Dependence on wheelchair: Secondary | ICD-10-CM | POA: Diagnosis not present

## 2022-05-25 DIAGNOSIS — N3289 Other specified disorders of bladder: Secondary | ICD-10-CM | POA: Diagnosis not present

## 2022-05-25 DIAGNOSIS — N3000 Acute cystitis without hematuria: Secondary | ICD-10-CM | POA: Diagnosis not present

## 2022-05-25 DIAGNOSIS — B965 Pseudomonas (aeruginosa) (mallei) (pseudomallei) as the cause of diseases classified elsewhere: Secondary | ICD-10-CM | POA: Diagnosis not present

## 2022-05-25 DIAGNOSIS — G800 Spastic quadriplegic cerebral palsy: Secondary | ICD-10-CM | POA: Diagnosis not present

## 2022-05-25 DIAGNOSIS — Z792 Long term (current) use of antibiotics: Secondary | ICD-10-CM | POA: Diagnosis not present

## 2022-05-25 DIAGNOSIS — R9431 Abnormal electrocardiogram [ECG] [EKG]: Secondary | ICD-10-CM | POA: Diagnosis not present

## 2022-06-07 DIAGNOSIS — G4731 Primary central sleep apnea: Secondary | ICD-10-CM | POA: Diagnosis not present

## 2022-06-21 DIAGNOSIS — G4731 Primary central sleep apnea: Secondary | ICD-10-CM | POA: Diagnosis not present

## 2022-06-21 DIAGNOSIS — R0600 Dyspnea, unspecified: Secondary | ICD-10-CM | POA: Diagnosis not present

## 2022-06-21 DIAGNOSIS — G809 Cerebral palsy, unspecified: Secondary | ICD-10-CM | POA: Diagnosis not present

## 2022-06-24 DIAGNOSIS — N390 Urinary tract infection, site not specified: Secondary | ICD-10-CM | POA: Diagnosis not present

## 2022-06-24 DIAGNOSIS — R509 Fever, unspecified: Secondary | ICD-10-CM | POA: Diagnosis not present

## 2022-06-30 DIAGNOSIS — G808 Other cerebral palsy: Secondary | ICD-10-CM | POA: Diagnosis not present

## 2022-06-30 DIAGNOSIS — R252 Cramp and spasm: Secondary | ICD-10-CM | POA: Diagnosis not present

## 2022-07-06 DIAGNOSIS — G4731 Primary central sleep apnea: Secondary | ICD-10-CM | POA: Diagnosis not present

## 2022-07-08 DIAGNOSIS — G4731 Primary central sleep apnea: Secondary | ICD-10-CM | POA: Diagnosis not present

## 2022-07-12 DIAGNOSIS — L858 Other specified epidermal thickening: Secondary | ICD-10-CM | POA: Diagnosis not present

## 2022-07-12 DIAGNOSIS — L720 Epidermal cyst: Secondary | ICD-10-CM | POA: Diagnosis not present

## 2022-07-12 DIAGNOSIS — L739 Follicular disorder, unspecified: Secondary | ICD-10-CM | POA: Diagnosis not present

## 2022-07-12 DIAGNOSIS — L219 Seborrheic dermatitis, unspecified: Secondary | ICD-10-CM | POA: Diagnosis not present

## 2022-07-14 DIAGNOSIS — G800 Spastic quadriplegic cerebral palsy: Secondary | ICD-10-CM | POA: Diagnosis not present

## 2022-07-19 DIAGNOSIS — J410 Simple chronic bronchitis: Secondary | ICD-10-CM | POA: Diagnosis not present

## 2022-08-07 DIAGNOSIS — G4731 Primary central sleep apnea: Secondary | ICD-10-CM | POA: Diagnosis not present

## 2022-08-09 ENCOUNTER — Other Ambulatory Visit: Payer: Self-pay | Admitting: Cardiology

## 2022-08-15 DIAGNOSIS — G808 Other cerebral palsy: Secondary | ICD-10-CM | POA: Diagnosis not present

## 2022-08-15 DIAGNOSIS — Z451 Encounter for adjustment and management of infusion pump: Secondary | ICD-10-CM | POA: Diagnosis not present

## 2022-08-15 DIAGNOSIS — R252 Cramp and spasm: Secondary | ICD-10-CM | POA: Diagnosis not present

## 2022-08-19 DIAGNOSIS — J410 Simple chronic bronchitis: Secondary | ICD-10-CM | POA: Diagnosis not present

## 2022-08-24 DIAGNOSIS — N319 Neuromuscular dysfunction of bladder, unspecified: Secondary | ICD-10-CM | POA: Diagnosis not present

## 2022-08-24 DIAGNOSIS — K5900 Constipation, unspecified: Secondary | ICD-10-CM | POA: Diagnosis not present

## 2022-08-24 DIAGNOSIS — R252 Cramp and spasm: Secondary | ICD-10-CM | POA: Diagnosis not present

## 2022-08-29 DIAGNOSIS — L723 Sebaceous cyst: Secondary | ICD-10-CM | POA: Diagnosis not present

## 2022-09-01 NOTE — Progress Notes (Signed)
Chart reviewed with Dr Mal Amabile. Because pt is >50kg, and has many commodities including CP,  SVT, OSA, chronic pneumonia/ atelectasis,  difficult IV access and complications from anesthesia in the past, his surgery will need to be moved to the Main OR. LVM with Morrie Sheldon at [facility]. Mom is aware and will also call the office to arrange surgery.

## 2022-09-07 DIAGNOSIS — G4731 Primary central sleep apnea: Secondary | ICD-10-CM | POA: Diagnosis not present

## 2022-09-08 ENCOUNTER — Other Ambulatory Visit: Payer: Self-pay | Admitting: General Surgery

## 2022-09-09 ENCOUNTER — Encounter (HOSPITAL_COMMUNITY): Payer: Self-pay | Admitting: General Surgery

## 2022-09-09 ENCOUNTER — Other Ambulatory Visit: Payer: Self-pay

## 2022-09-09 NOTE — Progress Notes (Signed)
Anesthesia Chart Review: Same-day workup  Follows with [facility] for history of hydrocephalus s/p VA shunt, severe cerebral palsy, spastic quadriparesis, complex partial seizures, central and obstructive sleep apnea (uses VPAP device), neurogenic bladder, recurrent UTIs.  He has baclofen pump in place.  Uses a text-to-speech device for communication.  Per notes from neurologist Dr. Laveda Norman, patient has complex partial seizures about 0-4 times per week, triggered by excitement, and are very brief.  Recently they have been less frequent, around 3 to 4 per month.  He is currently maintained on lacosamide 150/200 mg.  Last seen 04/26/2022, stable at that time, no changes to management, 1 year follow-up recommended.  Follows with cardiologist Dr. Wyline Mood at [facility] for history of tachycardia/PSVT.  Currently doing well on diltiazem 90 mg twice daily.  Last seen 05/10/2022, no changes to management, 1 year follow-up recommended.  Patient requires thickened liquids.  Per mom, he will require about 4 ounces of thickened water to take diltiazem, Vimpat, Prilosec, Flomax on day of surgery.  She plans to administer the medications around 3 AM.  Surgery scheduled for 7:30 AM.  Reviewed this with Dr. Maple Hudson who advised that this timing will be appropriate.  Per Dr. Dwain Sarna, plan is to perform procedure with local anesthesia.  Patient will need day of surgery evaluation.  EKG 05/22/2022 (Care Everywhere): Sinus tachycardia.  Rate 115.  Possible inferior infarct.  TTE 06/14/2016: Study Conclusions   - Left ventricle: The cavity size was normal. Wall thickness was    normal. Systolic function was vigorous. The estimated ejection    fraction was in the range of 65% to 70%. Wall motion was normal;    there were no regional wall motion abnormalities. Left    ventricular diastolic function parameters were normal.  - Mitral valve: There was trivial regurgitation.  - Right atrium: Central venous pressure (est): 3 mm Hg.  -  Tricuspid valve: There was trivial regurgitation.  - Pulmonary arteries: Systolic pressure could not be accurately    estimated.  - Pericardium, extracardiac: A probable small pericardial effusion    with organization was identified circumferential to the heart.   Impressions:   - Normal LV wall thickness with LVEF 65-70% and normal diastolic    function. Trivial mitral and tricuspid regurgitation. Probable    small circumferential pericardial effusion with organization    noted.     Zannie Cove [facility] Short Stay Center/Anesthesiology Phone [phone] 09/09/2022 1:58 PM

## 2022-09-09 NOTE — Anesthesia Preprocedure Evaluation (Signed)
Anesthesia Evaluation  Patient identified by MRN, date of birth, ID band Patient awake    Reviewed: Allergy & Precautions, NPO status , Patient's Chart, lab work & pertinent test results  History of Anesthesia Complications (+) PONV and history of anesthetic complications  Airway Mallampati: II  TM Distance: >3 FB Neck ROM: Full    Dental  (+) Dental Advisory Given, Teeth Intact   Pulmonary sleep apnea    Pulmonary exam normal breath sounds clear to auscultation       Cardiovascular Normal cardiovascular exam+ dysrhythmias  Rhythm:Regular Rate:Normal     Neuro/Psych Seizures -,     GI/Hepatic Neg liver ROS,GERD  Medicated,,  Endo/Other  negative endocrine ROS    Renal/GU negative Renal ROS     Musculoskeletal negative musculoskeletal ROS (+)    Abdominal   Peds  Hematology  (+) Blood dyscrasia, anemia   Anesthesia Other Findings   Reproductive/Obstetrics                             Anesthesia Physical Anesthesia Plan  ASA: 3  Anesthesia Plan: MAC   Post-op Pain Management: Minimal or no pain anticipated   Induction: Intravenous  PONV Risk Score and Plan: 1 and Treatment may vary due to age or medical condition  Airway Management Planned: Natural Airway  Additional Equipment:   Intra-op Plan:   Post-operative Plan:   Informed Consent: I have reviewed the patients History and Physical, chart, labs and discussed the procedure including the risks, benefits and alternatives for the proposed anesthesia with the patient or authorized representative who has indicated his/her understanding and acceptance.     Dental advisory given  Plan Discussed with: CRNA  Anesthesia Plan Comments: (PAT note by Antionette Poles, PA-C: Follows with [facility] for history of hydrocephalus s/p VA shunt, severe cerebral palsy, spastic quadriparesis, complex partial seizures, central and obstructive sleep  apnea (uses VPAP device), neurogenic bladder, recurrent UTIs.  He has baclofen pump in place.  Uses a text-to-speech device for communication.  Per notes from neurologist Dr. Laveda Norman, patient has complex partial seizures about 0-4 times per week, triggered by excitement, and are very brief.  Recently they have been less frequent, around 3 to 4 per month.  He is currently maintained on lacosamide 150/200 mg.  Last seen 04/26/2022, stable at that time, no changes to management, 1 year follow-up recommended.  Follows with cardiologist Dr. Wyline Mood at [facility] for history of tachycardia/PSVT.  Currently doing well on diltiazem 90 mg twice daily.  Last seen 05/10/2022, no changes to management, 1 year follow-up recommended.  Patient requires thickened liquids.  Per mom, he will require about 4 ounces of thickened water to take diltiazem, Vimpat, Prilosec, Flomax on day of surgery.  She plans to administer the medications around 3 AM.  Surgery scheduled for 7:30 AM.  Reviewed this with Dr. Maple Hudson who advised that this timing will be appropriate.  Per Dr. Dwain Sarna, plan is to perform procedure with local anesthesia.  Patient will need day of surgery evaluation.  EKG 05/22/2022 (Care Everywhere): Sinus tachycardia.  Rate 115.  Possible inferior infarct.  TTE 06/14/2016: Study Conclusions   - Left ventricle: The cavity size was normal. Wall thickness was  normal. Systolic function was vigorous. The estimated ejection  fraction was in the range of 65% to 70%. Wall motion was normal;  there were no regional wall motion abnormalities. Left  ventricular diastolic function parameters were normal.  - Mitral  valve: There was trivial regurgitation.  - Right atrium: Central venous pressure (est): 3 mm Hg.  - Tricuspid valve: There was trivial regurgitation.  - Pulmonary arteries: Systolic pressure could not be accurately  estimated.  - Pericardium, extracardiac: A probable small pericardial effusion  with  organization was identified circumferential to the heart.   Impressions:   - Normal LV wall thickness with LVEF 65-70% and normal diastolic  function. Trivial mitral and tricuspid regurgitation. Probable  small circumferential pericardial effusion with organization  noted.    )        Anesthesia Quick Evaluation

## 2022-09-09 NOTE — Progress Notes (Signed)
SDW CALL  Patient was given pre-op instructions over the phone. The opportunity was given for the patient to ask questions. No further questions asked. Patient verbalized understanding of instructions given.   PCP - Drue Dun Cardiologist - Karna Dupes Neurology - Varney Baas Tran,MD Neurosurgery - Leitha Schuller, MD  PPM/ICD - denies Device Orders -  Rep Notified -   Chest x-ray - 12/11/20 EKG - 11/10/21 Stress Test - none ECHO - 06/14/16 Cardiac Cath - denies  Sleep Study -  CPAP - yes  Fasting Blood Sugar - na Checks Blood Sugar _____ times a day  Blood Thinner Instructions:na Aspirin Instructions:na  ERAS Protcol -clear liquids until 0300 PRE-SURGERY Ensure or G2- no  COVID TEST- na   Anesthesia review: yes-history of cerebral palsy,paralysis,PSVT,OSA-uses BIPAP with O2  Patient denies shortness of breath, fever, cough and chest pain over the phone call    Surgical Instructions    Your procedure is scheduled on Monday August 26.  Report to [facility] Main Entrance "A" at 5:30 A.M., then check in with the Admitting office.  Call this number if you have problems the morning of surgery:  [phone]    Remember:  Do not eat after midnight the night before your surgery  You may drink clear liquids until 0300 the morning of your surgery.   Clear liquids allowed are: Water, Non-Citrus Juices (without pulp), Carbonated Beverages, Clear Tea, Black Coffee ONLY (NO MILK, CREAM OR POWDERED CREAMER of any kind), and Gatorade   Take these medicines the morning of surgery with A SIP OF WATER:  Diltiazem,Vimpat,Prilosec,Flomax,Restasis eye gtts, and Sodium chloride hypertonic nebulizer. Pt's mother reports she will be giving patient his meds at 0300 with 1/2 cup thickened water at 0300. No liquids after that.    As of today, STOP taking any Aspirin (unless otherwise instructed by your surgeon) Aleve, Naproxen, Ibuprofen, Motrin, Advil, Goody's, BC's, all  herbal medications, fish oil, and all vitamins.  [facility] is not responsible for any belongings or valuables. .   Do NOT Smoke (Tobacco/Vaping)  24 hours prior to your procedure  If you use a CPAP at night, you may bring your mask for your overnight stay.   Contacts, glasses, hearing aids, dentures or partials may not be worn into surgery, please bring cases for these belongings   Patients discharged the day of surgery will not be allowed to drive home, and someone needs to stay with them for 24 hours.   Special instructions:    Oral Hygiene is also important to reduce your risk of infection.  Remember - BRUSH YOUR TEETH THE MORNING OF SURGERY WITH YOUR REGULAR TOOTHPASTE   Day of Surgery:  Take a shower the day of or night before with antibacterial soap. Wear Clean/Comfortable clothing the morning of surgery Do not apply any deodorants/lotions.   Do not wear jewelry or makeup Do not wear lotions, powders, perfumes/colognes, or deodorant. Do not shave 48 hours prior to surgery.  Men may shave face and neck. Do not bring valuables to the hospital. Do not wear nail polish, gel polish, artificial nails, or any other type of covering on natural nails (fingers and toes) If you have artificial nails or gel coating that need to be removed by a nail salon, please have this removed prior to surgery. Artificial nails or gel coating may interfere with anesthesia's ability to adequately monitor your vital signs. Remember to brush your teeth WITH YOUR REGULAR TOOTHPASTE.

## 2022-09-11 NOTE — H&P (Signed)
41 yom with CP. Joshua Logan has long history of medical issues with many surgeries. Joshua Logan is nonverbal and his computer not functional today. Joshua Logan is her with his caregiver. Joshua Logan has history of PDA ligated, VP now VA shunt, BIH, spinal fusion, baclofen pump, multiple UTIs and procedures (last 5/24). Joshua Logan has history of a back cyst that has now been infected twice. This does bother him. Recently seen by derm and referred. Joshua Logan does express interest in having this removed. Joshua Logan has difficult IV access and has been talked to before about a port. They have avoided this due to infectious risk.   Review of Systems: A complete review of systems was obtained from the patient. I have reviewed this information and discussed as appropriate with the patient. See HPI as well for other ROS.  Review of Systems  Neurological: Positive for seizures.  All other systems reviewed and are negative.   Medical History: Past Medical History:  Diagnosis Date  Central sleep apnea  Cerebral palsy (CMS/HHS-HCC)  with spastic quadriparesis and numerous ortho procedures  Chronic constipation  Chronic ectopic atrial tachycardia  GERD (gastroesophageal reflux disease)  History of anesthesia reaction 2003  pupils were unreactive for approx 48 hours after anesthesia with Versed, no recurrence with recent procedures  Hydrocephalus (CMS/HHS-HCC)  s/p shunt  Hypoxemia  chronic  Iron deficiency anemia  Left lower lobe pneumonia  possible aspiration  Optic nerve hypoplasia of both eyes  PONV (postoperative nausea and vomiting)  Sleep apnea, obstructive  Spasticity  Strabismus  XT   Patient Active Problem List  Diagnosis  Gastroesophageal reflux disease without esophagitis  Constipation - functional  Cerebral palsy, quadriplegic (CMS-HCC)  Abscess  Central sleep apnea  Exotropia  Optic nerve hypoplasia of both eyes  Strabismus  S/P eye surgery, follow-up exam - Bilateral lateral recessions  Myopia of both eyes  Intractable  epilepsy with complex partial seizures (CMS/HHS-HCC)  Neurogenic bladder  Urinary retention with incomplete bladder emptying  Hydrocephalus (CMS/HHS-HCC)  Constipation due to slow transit  Alternating esotropia  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  Other optic atrophy, bilateral  Dysphagia, pharyngoesophageal  OSA (obstructive sleep apnea)  PSVT (paroxysmal supraventricular tachycardia)  Tachycardia  Oropharyngeal dysphagia  Eructation  Spasticity   Past Surgical History:  Procedure Laterality Date  STRABISMUS EYE SURGERY Bilateral 12/11/1989  BILATERAL LATERAL RECTUS RECESSION  SPINAL FUSION 6/97  T5 to pelvis post fusin  REMOVAL POSTERIOR SEGMENTAL LUMBAR/THORACIC SPINAL HARDWARE 7/98  TRANSUMBILICAL AUGMENTATION MAMMAPLASTY 7/98  HIP SURGERY 5/02  REMOVAL COMPLETE CSF SHUNT SYSTEM 5/03  EVD, pseudocyst  IMPLANTATION SUBCUTANEOUS RESERVOIR FOR DRUG INFUSION Left 06/27/2016  Procedure: IMPLANTATION SUBCUTANEOUS RESERVOIR FOR DRUG INFUSION replace baclofen pump; Surgeon: Samson Frederic, MD; Location: [facility]; Service: Neurosurgery; Laterality: Left;  ADDUCTOR RELEASE Bilateral  INSERTION VENTRICULO-PERITONEAL SHUNT  birth,5/03-PS Medical Performance Level 1.5 Delta  intrathecal baclofen pump 5/99,04/23/03, 11/12/09  due to spasticity  REVISION VENTRICULO PERITONEAL SHUNT VP W/REPLACEMENT  age, 6/94,5/02    Allergies  Allergen Reactions  Bactrim [Sulfamethoxazole-Trimethoprim] Rash  Versed [Midazolam] Other (See Comments)  Pupils remain dilated for more than 24hours  Vancomycin Rash  Red man syndrome  Augmentin [Amoxicillin-Pot Clavulanate] Rash and Other (See Comments)  blisters  Doxycycline Other (See Comments)  Bad reflux  Others Rash  Uncoded Allergy. Allergen: BLOOD PRODUCTS, Other Reaction: Not Assessed  Piperacillin-Tazobactam Rash   Current Outpatient Medications on File Prior to Visit  Medication Sig Dispense Refill  amoxicillin  (AMOXIL) 500 MG capsule Take 2,000 mg by  mouth as directed (PRN prior to dental procedures).  azelastine (ASTEPRO) 0.15 % (205.5 mcg) nasal spray U 2 SPRAYS IEN QD  bacitracin ointment Apply topically 2 (two) times daily. To areas that had blisters 14 g 0  baclofen, PF,  cephalexin (KEFELX) 250 mg tablet Take 1 tablet (250 mg total) by mouth once daily for 90 days 30 tablet 3  chlorhexidine (HIBICLENS) 4 % external wash Apply to right leg when bathed, then rinse off 236 mL 3  ciprofloxacin HCl (CIPRO) 500 MG tablet Take 1 tablet (500 mg total) by mouth 2 (two) times daily for 10 days 20 tablet 2  clindamycin (CLEOCIN T) 1 % lotion Apply twice a day to acne on face/neck and as needed to folliculitis on trunk and extremities 60 mL 4  clobetasoL (TEMOVATE) 0.05 % cream Apply topically once daily as needed (PRN red bumps) 30 g 2  cycloSPORINE (RESTASIS) 0.05 % ophthalmic emulsion Place 1 drop into both eyes 2 (two) times daily 180 each 3  dilTIAZem (CARDIZEM) 90 MG tablet Take 90 mg by mouth 2 (two) times daily  hydrocortisone 2.5 % ointment Apply 1-2 times a day to affected groin or face for rash. 30 g 3  ketoconazole (NIZORAL) 2 % cream Apply once to twice a day for redness and scaling on face prn dermatitis flares 60 g 4  ketoconazole (NIZORAL) 2 % shampoo APPLY TWICE A WEEK PRN TO SCALP, LEAVE ON FOR 5 MINUTES, THEN RINSE OUT 120 mL 10  lacosamide (VIMPAT) 150 mg tablet Take 1 tablet (150 mg total) by mouth every morning 90 tablet 1  lacosamide (VIMPAT) 200 mg tablet Take 1 tablet (200 mg total) by mouth every evening Take with 150 mg in the morning for a total of 350 mg a day. 90 tablet 1  lidocaine-prilocaine (EMLA) cream Apply 45 minutes prior to procedure 30 g 2  linaCLOtide (LINZESS) 145 mcg capsule Take 1 capsule (145 mcg total) by mouth once daily 90 capsule 3  nitrofurantoin (MACRODANTIN) 100 MG capsule Take 1 capsule (100 mg total) by mouth at bedtime for 140 days 28 capsule 4  omeprazole  (PRILOSEC) 40 MG DR capsule Take 1 capsule (40 mg total) by mouth 2 (two) times daily before meals 180 capsule 3  polyethylene glycol (MIRALAX) powder Take 17 g by mouth once daily Mix in 4-8ounces of fluid prior to taking. 510 g 3  sodium chloride 3 % nebulizer solution Take 3 mLs by nebulization 3 (three) times daily 2-3 times daily  sucralfate (CARAFATE) 100 mg/mL suspension SHAKE LIQUID AND TAKE 5 ML(0.5 GRAM) BY MOUTH FOUR TIMES DAILY BEFORE MEALS AND AT NIGHT 600 mL 11  tamsulosin (FLOMAX) 0.4 mg capsule Take 1 capsule (0.4 mg total) by mouth once daily 30 MINUTES AFTER THE SAME MEAL 30 capsule 11  THICK NOW Powd With meals 0  tretinoin (RETIN-A) 0.025 % cream Apply a a thin layer to affected areas nightly 20 min after washing. Start with two nights weekly and increase as tolerated.  urea (CEROVEL) 40 % topical lotion Apply 1-2 times a day for keratosis pilaris on arms. 237 mL 4    Family History  Problem Relation Age of Onset  Colon cancer Father  Cancer Father  Seizures Neg Hx  Anesthesia problems Neg Hx    Social History   Tobacco Use  Smoking Status Never  Smokeless Tobacco Never  Marital status: Single  Tobacco Use  Smoking status: Never  Smokeless tobacco: Never  Vaping Use  Vaping  status: Never Used  Substance and Sexual Activity  Alcohol use: No  Alcohol/week: 0.0 standard drinks of alcohol  Drug use: No  Sexual activity: Defer  Social History Narrative  The patient [place] mother and [place]. Joshua Logan is able to communicate by tapping on his electronic keyboard. Joshua Logan is otherwise nonverbal. 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    Objective:   Vitals:  08/29/22 0959  BP: 129/82  Pulse: 91  Temp: 36.9 C (98.5 F)   Physical Exam Vitals reviewed.  Constitutional:  Appearance: Normal appearance.  Cardiovascular:  Rate and Rhythm: Normal rate.  Pulmonary:  Effort: Pulmonary  effort is normal.  Comments: Midline back incision Middle of back Joshua Logan has a 2.5 cm sebaceous cyst that is not infected Neurological:  Mental Status: Joshua Logan is alert.   Assessment and Plan:   Sebaceous cyst  Options are to excise or observe. This does bother him and has been infected twice at this point. I am not able to do in office just due to logistics of this. I think we could do this under local at hospital with logistical support. We discussed this in detail today and will plan to proceed. Specifically discussed risk of postop infection, open wound and recurrence

## 2022-09-12 ENCOUNTER — Ambulatory Visit (HOSPITAL_COMMUNITY)
Admission: RE | Admit: 2022-09-12 | Discharge: 2022-09-12 | Disposition: A | Discharge status: Home / Self Care | Payer: Medicare PPO | Source: Home / Self Care | Attending: General Surgery | Admitting: General Surgery

## 2022-09-12 ENCOUNTER — Other Ambulatory Visit: Payer: Self-pay

## 2022-09-12 ENCOUNTER — Ambulatory Visit (HOSPITAL_COMMUNITY): Payer: Medicare PPO | Admitting: Physician Assistant

## 2022-09-12 ENCOUNTER — Ambulatory Visit (HOSPITAL_BASED_OUTPATIENT_CLINIC_OR_DEPARTMENT_OTHER): Payer: Medicare PPO | Admitting: Physician Assistant

## 2022-09-12 ENCOUNTER — Encounter (HOSPITAL_COMMUNITY)
Admission: RE | Disposition: A | Discharge status: Home / Self Care | Payer: Self-pay | Source: Home / Self Care | Attending: General Surgery

## 2022-09-12 ENCOUNTER — Encounter (HOSPITAL_COMMUNITY): Payer: Self-pay | Admitting: General Surgery

## 2022-09-12 DIAGNOSIS — G4730 Sleep apnea, unspecified: Secondary | ICD-10-CM | POA: Insufficient documentation

## 2022-09-12 DIAGNOSIS — K219 Gastro-esophageal reflux disease without esophagitis: Secondary | ICD-10-CM | POA: Insufficient documentation

## 2022-09-12 DIAGNOSIS — G809 Cerebral palsy, unspecified: Secondary | ICD-10-CM | POA: Insufficient documentation

## 2022-09-12 DIAGNOSIS — L723 Sebaceous cyst: Secondary | ICD-10-CM | POA: Insufficient documentation

## 2022-09-12 DIAGNOSIS — G4733 Obstructive sleep apnea (adult) (pediatric): Secondary | ICD-10-CM | POA: Diagnosis not present

## 2022-09-12 DIAGNOSIS — Z981 Arthrodesis status: Secondary | ICD-10-CM | POA: Insufficient documentation

## 2022-09-12 DIAGNOSIS — L720 Epidermal cyst: Secondary | ICD-10-CM | POA: Diagnosis not present

## 2022-09-12 HISTORY — DX: Cardiac arrhythmia, unspecified: I49.9

## 2022-09-12 HISTORY — DX: Personal history of urinary calculi: Z87.442

## 2022-09-12 HISTORY — DX: Other specified postprocedural states: Z98.890

## 2022-09-12 HISTORY — PX: IRRIGATION AND DEBRIDEMENT SEBACEOUS CYST: SHX5255

## 2022-09-12 HISTORY — DX: Unspecified convulsions: R56.9

## 2022-09-12 SURGERY — IRRIGATION AND DEBRIDEMENT SEBACEOUS CYST
Anesthesia: Monitor Anesthesia Care

## 2022-09-12 MED ORDER — ALBUMIN HUMAN 5 % IV SOLN
INTRAVENOUS | Status: AC
  Filled 2022-09-12: qty 250

## 2022-09-12 MED ORDER — ACETAMINOPHEN 500 MG PO TABS: 1000.0000 mg | ORAL_TABLET | ORAL | Status: DC

## 2022-09-12 MED ORDER — CHLORHEXIDINE GLUCONATE CLOTH 2 % EX PADS: 6.0000 | MEDICATED_PAD | Freq: Once | CUTANEOUS | Status: DC

## 2022-09-12 MED ORDER — CHLORHEXIDINE GLUCONATE 0.12 % MT SOLN
15.0000 mL | Freq: Once | OROMUCOSAL | Status: AC
  Filled 2022-09-12: qty 15

## 2022-09-12 MED ORDER — MIDAZOLAM HCL 2 MG/2ML IJ SOLN
INTRAMUSCULAR | Status: DC | PRN
  Administered 2022-09-12 (×2): 1 mg via INTRAVENOUS

## 2022-09-12 MED ORDER — FENTANYL CITRATE (PF) 100 MCG/2ML IJ SOLN: 25.0000 ug | INTRAMUSCULAR | Status: DC | PRN

## 2022-09-12 MED ORDER — LACTATED RINGERS IV SOLN: INTRAVENOUS | Status: DC

## 2022-09-12 MED ORDER — ACETAMINOPHEN 650 MG RE SUPP: 650.0000 mg | RECTAL | Status: DC | PRN

## 2022-09-12 MED ORDER — ACETAMINOPHEN 10 MG/ML IV SOLN
INTRAVENOUS | Status: AC
  Filled 2022-09-12: qty 100

## 2022-09-12 MED ORDER — MIDAZOLAM HCL 2 MG/2ML IJ SOLN
INTRAMUSCULAR | Status: AC
  Filled 2022-09-12: qty 2

## 2022-09-12 MED ORDER — PROPOFOL 500 MG/50ML IV EMUL
INTRAVENOUS | Status: DC | PRN
  Administered 2022-09-12: 50 ug/kg/min via INTRAVENOUS

## 2022-09-12 MED ORDER — PHENYLEPHRINE 80 MCG/ML (10ML) SYRINGE FOR IV PUSH (FOR BLOOD PRESSURE SUPPORT)
PREFILLED_SYRINGE | INTRAVENOUS | Status: AC
  Filled 2022-09-12: qty 10

## 2022-09-12 MED ORDER — BUPIVACAINE-EPINEPHRINE (PF) 0.25% -1:200000 IJ SOLN
INTRAMUSCULAR | Status: AC
  Filled 2022-09-12: qty 30

## 2022-09-12 MED ORDER — PHENYLEPHRINE 80 MCG/ML (10ML) SYRINGE FOR IV PUSH (FOR BLOOD PRESSURE SUPPORT)
PREFILLED_SYRINGE | INTRAVENOUS | Status: DC | PRN
  Administered 2022-09-12 (×2): 160 ug via INTRAVENOUS
  Administered 2022-09-12: 80 ug via INTRAVENOUS
  Administered 2022-09-12 (×2): 160 ug via INTRAVENOUS

## 2022-09-12 MED ORDER — BUPIVACAINE-EPINEPHRINE 0.25% -1:200000 IJ SOLN
INTRAMUSCULAR | Status: DC | PRN
  Administered 2022-09-12: 12 mL

## 2022-09-12 MED ORDER — SODIUM CHLORIDE 0.9% FLUSH: 3.0000 mL | INTRAVENOUS | Status: DC | PRN

## 2022-09-12 MED ORDER — PROPOFOL 10 MG/ML IV BOLUS
INTRAVENOUS | Status: AC
  Filled 2022-09-12: qty 20

## 2022-09-12 MED ORDER — FENTANYL CITRATE (PF) 250 MCG/5ML IJ SOLN
INTRAMUSCULAR | Status: AC
  Filled 2022-09-12: qty 5

## 2022-09-12 MED ORDER — PROPOFOL 1000 MG/100ML IV EMUL
INTRAVENOUS | Status: AC
  Filled 2022-09-12: qty 100

## 2022-09-12 MED ORDER — DROPERIDOL 2.5 MG/ML IJ SOLN: 0.6250 mg | Freq: Once | INTRAMUSCULAR | Status: DC | PRN

## 2022-09-12 MED ORDER — OXYCODONE HCL 5 MG PO TABS: 5.0000 mg | ORAL_TABLET | ORAL | Status: DC | PRN

## 2022-09-12 MED ORDER — ACETAMINOPHEN 10 MG/ML IV SOLN
INTRAVENOUS | Status: DC | PRN
Start: 2022-09-12 — End: 2022-09-12
  Administered 2022-09-12: 1000 mg via INTRAVENOUS

## 2022-09-12 MED ORDER — CIPROFLOXACIN IN D5W 400 MG/200ML IV SOLN
400.0000 mg | INTRAVENOUS | Status: AC
  Administered 2022-09-12: 400 mg via INTRAVENOUS
  Filled 2022-09-12: qty 200

## 2022-09-12 MED ORDER — ACETAMINOPHEN 325 MG PO TABS: 650.0000 mg | ORAL_TABLET | ORAL | Status: DC | PRN

## 2022-09-12 MED ORDER — EPHEDRINE SULFATE-NACL 50-0.9 MG/10ML-% IV SOSY
PREFILLED_SYRINGE | INTRAVENOUS | Status: DC | PRN
  Administered 2022-09-12: 5 mg via INTRAVENOUS

## 2022-09-12 MED ORDER — SODIUM CHLORIDE 0.9 % IV SOLN: 250.0000 mL | INTRAVENOUS | Status: DC | PRN

## 2022-09-12 MED ORDER — ORAL CARE MOUTH RINSE
15.0000 mL | Freq: Once | OROMUCOSAL | Status: AC
  Administered 2022-09-12: 15 mL via OROMUCOSAL

## 2022-09-12 MED ORDER — SODIUM CHLORIDE 0.9% FLUSH: 3.0000 mL | Freq: Two times a day (BID) | INTRAVENOUS | Status: DC

## 2022-09-12 MED ORDER — ALBUMIN HUMAN 5 % IV SOLN: INTRAVENOUS | Status: DC | PRN

## 2022-09-12 MED ORDER — 0.9 % SODIUM CHLORIDE (POUR BTL) OPTIME
TOPICAL | Status: DC | PRN
  Administered 2022-09-12: 1000 mL

## 2022-09-12 SURGICAL SUPPLY — 44 items
ADH SKN CLS APL DERMABOND .7 (GAUZE/BANDAGES/DRESSINGS) ×2
APL PRP STRL LF DISP 70% ISPRP (MISCELLANEOUS) ×1
APL SKNCLS STERI-STRIP NONHPOA (GAUZE/BANDAGES/DRESSINGS) ×1
BAG COUNTER SPONGE SURGICOUNT (BAG) ×1 IMPLANT
BAG SPNG CNTER NS LX DISP (BAG) ×1
BENZOIN TINCTURE PRP APPL 2/3 (GAUZE/BANDAGES/DRESSINGS) IMPLANT
BLADE CLIPPER SURG (BLADE) IMPLANT
CHLORAPREP W/TINT 26 (MISCELLANEOUS) ×1 IMPLANT
CNTNR URN SCR LID CUP LEK RST (MISCELLANEOUS) IMPLANT
CONT SPEC 4OZ STRL OR WHT (MISCELLANEOUS) ×1
COVER SURGICAL LIGHT HANDLE (MISCELLANEOUS) ×1 IMPLANT
DERMABOND ADVANCED .7 DNX12 (GAUZE/BANDAGES/DRESSINGS) ×1 IMPLANT
DRAPE LAPAROTOMY 100X72 PEDS (DRAPES) ×1 IMPLANT
DRSG OPSITE 4X5.5 SM (GAUZE/BANDAGES/DRESSINGS) IMPLANT
ELECT CAUTERY BLADE 6.4 (BLADE) ×1 IMPLANT
ELECT REM PT RETURN 9FT ADLT (ELECTROSURGICAL) ×1
ELECTRODE REM PT RTRN 9FT ADLT (ELECTROSURGICAL) ×1 IMPLANT
GAUZE 4X4 16PLY ~~LOC~~+RFID DBL (SPONGE) IMPLANT
GAUZE SPONGE 2X2 8PLY STRL LF (GAUZE/BANDAGES/DRESSINGS) IMPLANT
GLOVE BIO SURGEON STRL SZ7 (GLOVE) ×1 IMPLANT
GLOVE BIOGEL PI IND STRL 7.5 (GLOVE) ×1 IMPLANT
GOWN STRL REUS W/ TWL LRG LVL3 (GOWN DISPOSABLE) ×2 IMPLANT
GOWN STRL REUS W/TWL LRG LVL3 (GOWN DISPOSABLE) ×2
KIT BASIN OR (CUSTOM PROCEDURE TRAY) ×1 IMPLANT
KIT TURNOVER KIT B (KITS) ×1 IMPLANT
NDL HYPO 25GX1X1/2 BEV (NEEDLE) ×1 IMPLANT
NEEDLE HYPO 25GX1X1/2 BEV (NEEDLE) ×1
NS IRRIG 1000ML POUR BTL (IV SOLUTION) ×1 IMPLANT
PACK GENERAL/GYN (CUSTOM PROCEDURE TRAY) ×1 IMPLANT
PAD ARMBOARD 7.5X6 YLW CONV (MISCELLANEOUS) ×1 IMPLANT
PENCIL SMOKE EVACUATOR (MISCELLANEOUS) ×1 IMPLANT
SPECIMEN JAR SMALL (MISCELLANEOUS) IMPLANT
SPIKE FLUID TRANSFER (MISCELLANEOUS) ×1 IMPLANT
STAPLER VISISTAT 35W (STAPLE) ×1 IMPLANT
STRIP CLOSURE SKIN 1/2X4 (GAUZE/BANDAGES/DRESSINGS) IMPLANT
SUT ETHILON 2 0 FS 18 (SUTURE) IMPLANT
SUT MNCRL AB 4-0 PS2 18 (SUTURE) ×1 IMPLANT
SUT VIC AB 2-0 SH 27 (SUTURE) ×1
SUT VIC AB 2-0 SH 27X BRD (SUTURE) ×1 IMPLANT
SUT VIC AB 3-0 SH 27 (SUTURE) ×1
SUT VIC AB 3-0 SH 27XBRD (SUTURE) ×1 IMPLANT
SYR CONTROL 10ML LL (SYRINGE) ×1 IMPLANT
TOWEL GREEN STERILE (TOWEL DISPOSABLE) ×1 IMPLANT
TOWEL GREEN STERILE FF (TOWEL DISPOSABLE) ×1 IMPLANT

## 2022-09-12 NOTE — Transfer of Care (Addendum)
Immediate Anesthesia Transfer of Care Note  Patient: Joshua Logan  Procedure(s) Performed: EXCISIONOF BACK SEBACEOUS CYST  Patient Location: PACU  Anesthesia Type:MAC  Level of Consciousness: drowsy  Airway & Oxygen Therapy: Patient Spontanous Breathing and Patient connected to face mask oxygen  Post-op Assessment: Report given to RN and Post -op Vital signs reviewed and stable  Post vital signs: BP low. Dr. Renold Don notified.  Last Vitals:  Vitals Value Taken Time  BP 69/44 09/12/22 0832  Temp    Pulse 54 09/12/22 0833  Resp 13 09/12/22 0833  SpO2 98 % 09/12/22 0833  Vitals shown include unfiled device data.  Last Pain:  Vitals:   09/12/22 0643  TempSrc:   PainSc: 0-No pain         Complications: No notable events documented.

## 2022-09-12 NOTE — Interval H&P Note (Signed)
History and Physical Interval Note:  09/12/2022 7:23 AM  Joshua Logan  has presented today for surgery, with the diagnosis of sebaceous cyst.  The various methods of treatment have been discussed with the patient and family. After consideration of risks, benefits and other options for treatment, the patient has consented to  Procedure(s): EXCISIONOF BACK SEBACEOUS CYST (N/A) as a surgical intervention.  The patient's history has been reviewed, patient examined, no change in status, stable for surgery.  I have reviewed the patient's chart and labs.  Questions were answered to the patient's satisfaction.     Emelia Loron

## 2022-09-12 NOTE — Discharge Instructions (Signed)
[facility]   A  prescription for pain medication may be given to you upon discharge.  Take your pain medication as prescribed, if needed.  If narcotic pain medicine is not needed, then you may take acetaminophen (Tylenol), naprosyn (Alleve) or ibuprofen (Advil) as needed. Take your usually prescribed medications unless otherwise directed. If you need a refill on your pain medication, please contact your pharmacy.  They will contact our office to request authorization. Prescriptions will not be filled after 5 pm or on week-ends. You should follow a light diet the first 24 hours after arrival home, such as soup and crackers, etc.  Be sure to include lots of fluids daily.  Resume your normal diet the day after surgery. Most patients will experience some swelling and bruising.  Ice packs will help.  Swelling and bruising can take several days to resolve.  It is common to experience some constipation if taking pain medication after surgery.  Increasing fluid intake and taking a stool softener (such as Colace) will usually help or prevent this problem from occurring.  A mild laxative (Milk of Magnesia or Miralax) should be taken according to package directions if there are no bowel movements after 48 hours. Unless discharge instructions indicate otherwise, you may remove your bandages 48 hours after surgery, and you may shower at that time.  You may have steri-strips (small skin tapes) in place directly over the incision.  These strips should be left on the skin for 7-10 days and will come off on their own.  If your surgeon used skin glue on the incision, you may shower in 24 hours.  The glue will flake off over the next 2-3 weeks.  Any sutures or staples will be removed at the office during your follow-up visit. ACTIVITIES:  You may resume regular (light) daily activities beginning the next day--such as daily self-care, walking, climbing stairs--gradually increasing activities as  tolerated.  You may drive when you are no longer taking prescription pain medication, you can comfortably wear a seatbelt, and you can safely maneuver your car and apply brakes. RETURN TO WORK:  __________________________________________________________ Joshua Logan should see your doctor in the office for a follow-up appointment approximately 2-3 weeks after your surgery.  Make sure that you call for this appointment within a day or two after you arrive home to insure a convenient appointment time. OTHER INSTRUCTIONS:  __________________________________________________________________________________________________________________________________________________________________________________________  WHEN TO CALL YOUR DOCTOR: Fever over 101.0 Inability to urinate Nausea and/or vomiting Extreme swelling or bruising Continued bleeding from incision. Increased pain, redness, or drainage from the incision  The clinic staff is available to answer your questions during regular business hours.  Please don't hesitate to call and ask to speak to one of the nurses for clinical concerns.  If you have a medical emergency, go to the nearest emergency room or call 911.  A surgeon from [facility] is always on call at the hospital   [address], [address], [place], [place]  [address] ?  [address] [address], [place], [place]   [address] [phone] ? [phone] ? FAX [phone] Web site: [email or web address]

## 2022-09-12 NOTE — Op Note (Signed)
Preoperative diagnosis: Sebaceous cyst, back, prior infection Postoperative diagnosis: Same as above Procedure: Excision of 2 cm back sebaceous cyst Surgeon: Dr. Harden Mo Anesthesia: Local with sedation Estimated blood loss minimal Specimens: Mass to pathology Complications: None Drains: Sponge and count was correct completion Disposition to recovery stable condition  Indications: This is a 42 year old male with cerebral palsy who is nonverbal.  He has had a extensive surgical history including a VA shunt, spinal fusion, baclofen pump.  He has had an infected back sebaceous cyst a couple of times.  He was referred to dermatology and due to all his comorbidities was referred to see me to discuss excision.  Along with his caregiver we discussed excision in the operating room due to the assistance that would require with positioning.  Procedure: After informed consent was obtained from his caregiver we then proceeded the operating room.  He was placed on the operating room table with the left side down his right leg could not have pressure on it.  He was appropriately padded.  He was then placed under sedation.  He was prepped and draped in the standard sterile surgical fashion.  Surgical timeout was then performed.  I made an elliptical incision around the sebaceous cyst after infiltrating Marcaine throughout the area.  I then remove the entire sebaceous cyst as well as the skin overlying it.  There was no wall that could be identified afterwards.  Hemostasis was obtained.  I closed this with a deep layer with a 2-0 Vicryl.  The skin was then closed with 3-0 Vicryl and a 4 Monocryl.  I placed 2 external 2-0 nylon sutures as well.  Steri-Strips were applied.  A dressing was applied.  He tolerated this well was transferred recovery room stable.

## 2022-09-12 NOTE — Anesthesia Procedure Notes (Signed)
Procedure Name: MAC Date/Time: 09/12/2022 7:40 AM  Performed by: Garfield Cornea, CRNAPre-anesthesia Checklist: Patient identified, Emergency Drugs available, Suction available and Patient being monitored Patient Re-evaluated:Patient Re-evaluated prior to induction Oxygen Delivery Method: Simple face mask Dental Injury: Teeth and Oropharynx as per pre-operative assessment

## 2022-09-13 ENCOUNTER — Encounter (HOSPITAL_COMMUNITY): Payer: Self-pay | Admitting: General Surgery

## 2022-09-13 NOTE — Anesthesia Postprocedure Evaluation (Signed)
Anesthesia Post Note  Patient: Joshua Logan  Procedure(s) Performed: EXCISIONOF BACK SEBACEOUS CYST     Patient location during evaluation: PACU Anesthesia Type: MAC Level of consciousness: awake and alert Pain management: pain level controlled Vital Signs Assessment: post-procedure vital signs reviewed and stable Respiratory status: spontaneous breathing Cardiovascular status: stable Anesthetic complications: no   No notable events documented.  Last Vitals:  Vitals:   09/12/22 0915 09/12/22 0930  BP: (!) 96/57 (!) 141/86  Pulse: (!) 55 88  Resp: (!) 8 15  Temp:  (!) 36.1 C  SpO2: 100% 97%    Last Pain:  Vitals:   09/12/22 0915  TempSrc:   PainSc: Asleep                 [name]

## 2022-09-14 LAB — SURGICAL PATHOLOGY

## 2022-09-19 DIAGNOSIS — J410 Simple chronic bronchitis: Secondary | ICD-10-CM | POA: Diagnosis not present

## 2022-09-21 DIAGNOSIS — G808 Other cerebral palsy: Secondary | ICD-10-CM | POA: Diagnosis not present

## 2022-09-21 DIAGNOSIS — M2450 Contracture, unspecified joint: Secondary | ICD-10-CM | POA: Diagnosis not present

## 2022-09-21 DIAGNOSIS — R252 Cramp and spasm: Secondary | ICD-10-CM | POA: Diagnosis not present

## 2022-09-26 DIAGNOSIS — R252 Cramp and spasm: Secondary | ICD-10-CM | POA: Diagnosis not present

## 2022-09-26 DIAGNOSIS — G808 Other cerebral palsy: Secondary | ICD-10-CM | POA: Diagnosis not present

## 2022-10-08 DIAGNOSIS — G4731 Primary central sleep apnea: Secondary | ICD-10-CM | POA: Diagnosis not present

## 2022-10-19 DIAGNOSIS — J410 Simple chronic bronchitis: Secondary | ICD-10-CM | POA: Diagnosis not present

## 2022-10-25 DIAGNOSIS — H5501 Congenital nystagmus: Secondary | ICD-10-CM | POA: Diagnosis not present

## 2022-10-25 DIAGNOSIS — H5005 Alternating esotropia: Secondary | ICD-10-CM | POA: Diagnosis not present

## 2022-10-25 DIAGNOSIS — Z09 Encounter for follow-up examination after completed treatment for conditions other than malignant neoplasm: Secondary | ICD-10-CM | POA: Diagnosis not present

## 2022-10-25 DIAGNOSIS — H47033 Optic nerve hypoplasia, bilateral: Secondary | ICD-10-CM | POA: Diagnosis not present

## 2022-10-25 DIAGNOSIS — H47293 Other optic atrophy, bilateral: Secondary | ICD-10-CM | POA: Diagnosis not present

## 2022-10-25 DIAGNOSIS — H0100A Unspecified blepharitis right eye, upper and lower eyelids: Secondary | ICD-10-CM | POA: Diagnosis not present

## 2022-10-27 DIAGNOSIS — G4731 Primary central sleep apnea: Secondary | ICD-10-CM | POA: Diagnosis not present

## 2022-11-07 DIAGNOSIS — Z01818 Encounter for other preprocedural examination: Secondary | ICD-10-CM | POA: Diagnosis not present

## 2022-11-07 DIAGNOSIS — G4733 Obstructive sleep apnea (adult) (pediatric): Secondary | ICD-10-CM | POA: Diagnosis not present

## 2022-11-07 DIAGNOSIS — G808 Other cerebral palsy: Secondary | ICD-10-CM | POA: Diagnosis not present

## 2022-11-07 DIAGNOSIS — Z451 Encounter for adjustment and management of infusion pump: Secondary | ICD-10-CM | POA: Diagnosis not present

## 2022-11-07 DIAGNOSIS — N319 Neuromuscular dysfunction of bladder, unspecified: Secondary | ICD-10-CM | POA: Diagnosis not present

## 2022-11-07 DIAGNOSIS — R252 Cramp and spasm: Secondary | ICD-10-CM | POA: Diagnosis not present

## 2022-11-07 DIAGNOSIS — G4731 Primary central sleep apnea: Secondary | ICD-10-CM | POA: Diagnosis not present

## 2022-11-13 ENCOUNTER — Other Ambulatory Visit: Payer: Self-pay | Admitting: Cardiology

## 2022-11-19 DIAGNOSIS — J410 Simple chronic bronchitis: Secondary | ICD-10-CM | POA: Diagnosis not present

## 2022-12-07 DIAGNOSIS — N319 Neuromuscular dysfunction of bladder, unspecified: Secondary | ICD-10-CM | POA: Diagnosis not present

## 2022-12-07 DIAGNOSIS — G4733 Obstructive sleep apnea (adult) (pediatric): Secondary | ICD-10-CM | POA: Diagnosis not present

## 2022-12-07 DIAGNOSIS — G4731 Primary central sleep apnea: Secondary | ICD-10-CM | POA: Diagnosis not present

## 2022-12-07 DIAGNOSIS — Z4549 Encounter for adjustment and management of other implanted nervous system device: Secondary | ICD-10-CM | POA: Diagnosis not present

## 2022-12-07 DIAGNOSIS — Z79899 Other long term (current) drug therapy: Secondary | ICD-10-CM | POA: Diagnosis not present

## 2022-12-07 DIAGNOSIS — Z981 Arthrodesis status: Secondary | ICD-10-CM | POA: Diagnosis not present

## 2022-12-07 DIAGNOSIS — G808 Other cerebral palsy: Secondary | ICD-10-CM | POA: Diagnosis not present

## 2022-12-07 DIAGNOSIS — Z982 Presence of cerebrospinal fluid drainage device: Secondary | ICD-10-CM | POA: Diagnosis not present

## 2022-12-07 DIAGNOSIS — Z882 Allergy status to sulfonamides status: Secondary | ICD-10-CM | POA: Diagnosis not present

## 2022-12-07 DIAGNOSIS — Z880 Allergy status to penicillin: Secondary | ICD-10-CM | POA: Diagnosis not present

## 2022-12-07 DIAGNOSIS — G40909 Epilepsy, unspecified, not intractable, without status epilepticus: Secondary | ICD-10-CM | POA: Diagnosis not present

## 2022-12-07 DIAGNOSIS — Z881 Allergy status to other antibiotic agents status: Secondary | ICD-10-CM | POA: Diagnosis not present

## 2022-12-07 DIAGNOSIS — Z87440 Personal history of urinary (tract) infections: Secondary | ICD-10-CM | POA: Diagnosis not present

## 2022-12-07 DIAGNOSIS — G800 Spastic quadriplegic cerebral palsy: Secondary | ICD-10-CM | POA: Diagnosis not present

## 2022-12-07 DIAGNOSIS — G809 Cerebral palsy, unspecified: Secondary | ICD-10-CM | POA: Diagnosis not present

## 2022-12-07 DIAGNOSIS — J9811 Atelectasis: Secondary | ICD-10-CM | POA: Diagnosis not present

## 2022-12-08 DIAGNOSIS — Z982 Presence of cerebrospinal fluid drainage device: Secondary | ICD-10-CM | POA: Diagnosis not present

## 2022-12-08 DIAGNOSIS — G808 Other cerebral palsy: Secondary | ICD-10-CM | POA: Diagnosis not present

## 2022-12-26 DIAGNOSIS — Z978 Presence of other specified devices: Secondary | ICD-10-CM | POA: Diagnosis not present

## 2022-12-26 DIAGNOSIS — G808 Other cerebral palsy: Secondary | ICD-10-CM | POA: Diagnosis not present

## 2023-01-04 DIAGNOSIS — J300 Vasomotor rhinitis: Secondary | ICD-10-CM | POA: Diagnosis not present

## 2023-01-04 DIAGNOSIS — G809 Cerebral palsy, unspecified: Secondary | ICD-10-CM | POA: Diagnosis not present

## 2023-01-04 DIAGNOSIS — G4731 Primary central sleep apnea: Secondary | ICD-10-CM | POA: Diagnosis not present

## 2023-01-04 DIAGNOSIS — J9611 Chronic respiratory failure with hypoxia: Secondary | ICD-10-CM | POA: Diagnosis not present

## 2023-01-07 DIAGNOSIS — G809 Cerebral palsy, unspecified: Secondary | ICD-10-CM | POA: Diagnosis not present

## 2023-01-07 DIAGNOSIS — G4731 Primary central sleep apnea: Secondary | ICD-10-CM | POA: Diagnosis not present

## 2023-01-07 DIAGNOSIS — G40319 Generalized idiopathic epilepsy and epileptic syndromes, intractable, without status epilepticus: Secondary | ICD-10-CM | POA: Diagnosis not present

## 2023-01-07 DIAGNOSIS — I10 Essential (primary) hypertension: Secondary | ICD-10-CM | POA: Diagnosis not present

## 2023-01-16 DIAGNOSIS — G808 Other cerebral palsy: Secondary | ICD-10-CM | POA: Diagnosis not present

## 2023-01-16 DIAGNOSIS — Z978 Presence of other specified devices: Secondary | ICD-10-CM | POA: Diagnosis not present

## 2023-01-23 DIAGNOSIS — R300 Dysuria: Secondary | ICD-10-CM | POA: Diagnosis not present

## 2023-01-30 DIAGNOSIS — G4731 Primary central sleep apnea: Secondary | ICD-10-CM | POA: Diagnosis not present

## 2023-02-01 DIAGNOSIS — G808 Other cerebral palsy: Secondary | ICD-10-CM | POA: Diagnosis not present

## 2023-02-07 DIAGNOSIS — J410 Simple chronic bronchitis: Secondary | ICD-10-CM | POA: Diagnosis not present

## 2023-02-07 DIAGNOSIS — G4731 Primary central sleep apnea: Secondary | ICD-10-CM | POA: Diagnosis not present

## 2023-02-27 DIAGNOSIS — G808 Other cerebral palsy: Secondary | ICD-10-CM | POA: Diagnosis not present

## 2023-02-27 DIAGNOSIS — Z978 Presence of other specified devices: Secondary | ICD-10-CM | POA: Diagnosis not present

## 2023-03-10 DIAGNOSIS — G4731 Primary central sleep apnea: Secondary | ICD-10-CM | POA: Diagnosis not present

## 2023-03-19 DIAGNOSIS — J410 Simple chronic bronchitis: Secondary | ICD-10-CM | POA: Diagnosis not present

## 2023-03-20 DIAGNOSIS — G919 Hydrocephalus, unspecified: Secondary | ICD-10-CM | POA: Diagnosis not present

## 2023-04-07 DIAGNOSIS — G4731 Primary central sleep apnea: Secondary | ICD-10-CM | POA: Diagnosis not present

## 2023-04-10 DIAGNOSIS — Z978 Presence of other specified devices: Secondary | ICD-10-CM | POA: Diagnosis not present

## 2023-04-10 DIAGNOSIS — G808 Other cerebral palsy: Secondary | ICD-10-CM | POA: Diagnosis not present

## 2023-04-12 ENCOUNTER — Other Ambulatory Visit: Payer: Self-pay | Admitting: Cardiology

## 2023-04-19 DIAGNOSIS — G809 Cerebral palsy, unspecified: Secondary | ICD-10-CM | POA: Diagnosis not present

## 2023-04-19 DIAGNOSIS — Z4689 Encounter for fitting and adjustment of other specified devices: Secondary | ICD-10-CM | POA: Diagnosis not present

## 2023-04-19 DIAGNOSIS — J410 Simple chronic bronchitis: Secondary | ICD-10-CM | POA: Diagnosis not present

## 2023-05-02 DIAGNOSIS — G40219 Localization-related (focal) (partial) symptomatic epilepsy and epileptic syndromes with complex partial seizures, intractable, without status epilepticus: Secondary | ICD-10-CM | POA: Diagnosis not present

## 2023-05-02 DIAGNOSIS — G800 Spastic quadriplegic cerebral palsy: Secondary | ICD-10-CM | POA: Diagnosis not present

## 2023-05-08 DIAGNOSIS — G4731 Primary central sleep apnea: Secondary | ICD-10-CM | POA: Diagnosis not present

## 2023-05-11 DIAGNOSIS — Z6820 Body mass index (BMI) 20.0-20.9, adult: Secondary | ICD-10-CM | POA: Diagnosis not present

## 2023-05-11 DIAGNOSIS — G4731 Primary central sleep apnea: Secondary | ICD-10-CM | POA: Diagnosis not present

## 2023-05-11 DIAGNOSIS — G809 Cerebral palsy, unspecified: Secondary | ICD-10-CM | POA: Diagnosis not present

## 2023-05-22 DIAGNOSIS — Z978 Presence of other specified devices: Secondary | ICD-10-CM | POA: Diagnosis not present

## 2023-05-22 DIAGNOSIS — G808 Other cerebral palsy: Secondary | ICD-10-CM | POA: Diagnosis not present

## 2023-05-24 ENCOUNTER — Ambulatory Visit: Attending: Cardiology | Admitting: Cardiology

## 2023-05-24 ENCOUNTER — Encounter: Payer: Self-pay | Admitting: Cardiology

## 2023-05-24 VITALS — BP 140/60 | HR 84 | Wt 116.0 lb

## 2023-05-24 DIAGNOSIS — I10 Essential (primary) hypertension: Secondary | ICD-10-CM | POA: Diagnosis not present

## 2023-05-24 DIAGNOSIS — R000 Tachycardia, unspecified: Secondary | ICD-10-CM

## 2023-05-24 DIAGNOSIS — I471 Supraventricular tachycardia, unspecified: Secondary | ICD-10-CM

## 2023-05-24 NOTE — Progress Notes (Signed)
 Clinical Summary Joshua Logan is a 43 y.o.male seen today for follow up of the following medical problems.    1. Paroxysmal supraventricular tachycardia/Chronic tachycardia - discovered initially during workup for abnormal spells the patient was having per notes with cardiac monitoring. - prior echo shows  normal LVEF with no other significant abnormalities - tried on metoprolol  however per report seemed to have increased frequency of episodes.   - he was later found to be having seizure activity during these spells by [facility], with improved episodes with alterations of his seizure therapy. The tachycardia may have been provoked by this seizure activity.       most recently on dilt 90mg  bid. Has done well as far as bp's and palpitations.  - phone note 11/16/21 patient had seen dentist and gums not felt significantly abnormal, was ok to stay on dilt   - compliant with meds       2. Cerebal palsy - followed at [facility]       3. HTN - he is compliant with meds - recent other appointments 152/99, 149/91, 125/77, 142/74   Past Medical History:  Diagnosis Date   Cerebral palsy (HCC)    with spastic quadriparesis with numerous orthopedic procedures for contractures and fratures   Chronic constipation    Chronic pulmonary aspiration    CSA (central sleep apnea)    Dysrhythmia    PSVT   GERD (gastroesophageal reflux disease)    History of kidney stones    Hydrocephalus (HCC)    s/p VP shunt   Hypoxemia    chronic   Iron deficiency anemia    OSA (obstructive sleep apnea)    PONV (postoperative nausea and vomiting)    once time after having baclofen  pump placed   Protein calorie malnutrition (HCC)    chronic   Seizures (HCC)    Tachycardia    chronic     Allergies  Allergen Reactions   Augmentin [Amoxicillin-Pot Clavulanate] Rash and Other (See Comments)    Blisters. Pt can take Amoxicillin, but NOT Augmentin    Bactrim [Sulfamethoxazole-Trimethoprim] Rash    Doxycycline Other (See Comments)    Bad reflux- long term use    Versed  [Midazolam ]     Eyes stayed dilated    Whole Blood     Pushing too fast Red Man Syndrome   Cefazolin  Rash   Midazolam  Hcl Other (See Comments)    Un-reactive pupils after surgery   Septra [Bactrim] Hives   Vancomycin Rash     Current Outpatient Medications  Medication Sig Dispense Refill   BACLOFEN  IT 1,449 mcg by Intrathecal route continuous.     cephALEXin (KEFLEX) 250 MG capsule Take 250 mg by mouth See admin instructions. 90 day cycle     diltiazem  (CARDIZEM  SR) 90 MG 12 hr capsule TAKE 1 CAPSULE(90 MG) BY MOUTH TWICE DAILY 60 capsule 6   ketoconazole (NIZORAL) 2 % cream Apply 1 Application topically daily as needed for irritation.     ketoconazole (NIZORAL) 2 % shampoo Apply 1 application  topically every other day.     Lacosamide  (VIMPAT ) 150 MG TABS Take 150 mg by mouth every morning.     lacosamide  (VIMPAT ) 200 MG TABS tablet Take 200 mg by mouth at bedtime.     lidocaine -prilocaine (EMLA) cream Apply 1 Application topically as needed (pain).     LINZESS  145 MCG CAPS Take 145 mcg by mouth daily.      nitrofurantoin (MACRODANTIN) 100  MG capsule Take 100 mg by mouth daily. 90 day cycle     omeprazole (PRILOSEC) 40 MG capsule Take 40 mg by mouth 2 (two) times daily.     polyethylene glycol (MIRALAX  / GLYCOLAX ) packet Take 8.5 g by mouth daily.     RESTASIS  0.05 % ophthalmic emulsion Place 1 drop into both eyes 2 (two) times daily.     sodium chloride  HYPERTONIC 3 % nebulizer solution Take 4 mLs by nebulization 3 (three) times daily.     sucralfate  (CARAFATE ) 1 GM/10ML suspension Take 500 mg by mouth 2 (two) times daily.     tamsulosin  (FLOMAX ) 0.4 MG CAPS capsule Take 0.4 mg by mouth daily.     tretinoin (RETIN-A) 0.025 % cream Apply 1 Application topically at bedtime as needed (irritation). Apply a a thin layer to affected areas nightly 20 min after washing. Start with two nights weekly and increase as  tolerated.     Urea 40 % LOTN Apply 1 Application topically 2 (two) times daily as needed (KERTOSIS PILARIS ON ARMS). APPLY 1-2 TIMES DAILY TO KERTOSIS PILARIS ON ARMS     No current facility-administered medications for this visit.     Past Surgical History:  Procedure Laterality Date   adductor lengthening Bilateral    10/1985   BACK SURGERY     [place] RODS PLACED FOR SCOLIOSIS   BACLOFEN  PUMP PLACEMENT     BRONCHOSCOPY     05/2010- [facility]   CSF SHUNT     CYSTOSCOPY     exploratory after UTI and ureter trauma- 09/2004   EYE SURGERY     MULTIPLE   HIP SURGERY     MULTIPLE; metal plate on right   INGUINAL HERNIA REPAIR     BILATERAL   IRRIGATION AND DEBRIDEMENT SEBACEOUS CYST N/A 09/12/2022   Procedure: EXCISIONOF BACK SEBACEOUS CYST;  Surgeon: Ebbie Cough, MD;  Location: [facility];  Service: General;  Laterality: N/A;   NEPHRECTOMY Left    [facility]   PATENT DUCTUS ARTERIOUS REPAIR     11/1981   pelvic hardware removal     07/1996   rectis recessions Bilateral    11/1989   removal hip plate     93/8006   RHIZOTOMY     bilateral L5 and S1 radiofrequency rhizotomy- 07/2001   right hip varus derotational osteotomy     05/1990   TOOTH EXTRACTION     2 teeth0 04/1994   VENTRICULAR ATRIAL SHUNT     VENTRICULOPERITONEAL SHUNT     12/1981   WISDOM TOOTH EXTRACTION     06/1996     Allergies  Allergen Reactions   Augmentin [Amoxicillin-Pot Clavulanate] Rash and Other (See Comments)    Blisters. Pt can take Amoxicillin, but NOT Augmentin    Bactrim [Sulfamethoxazole-Trimethoprim] Rash   Doxycycline Other (See Comments)    Bad reflux- long term use    Versed  [Midazolam ]     Eyes stayed dilated    Whole Blood     Pushing too fast Red Man Syndrome   Cefazolin  Rash   Midazolam  Hcl Other (See Comments)    Un-reactive pupils after surgery   Septra [Bactrim] Hives   Vancomycin Rash      Family History  Problem Relation Age of Onset   Osteoporosis  Mother    Osteoarthritis Mother        in her back   Colon cancer Father    Hypertension Father      Social History Joshua Logan  reports that he has never smoked. He has never used smokeless tobacco. Joshua Logan reports no history of alcohol use.    Physical Examination Today's Vitals   05/24/23 1318  BP: (!) 140/60  Pulse: 84  SpO2: 98%  Weight: 116 lb (52.6 kg)   Body mass index is 17.13 kg/m.  Gen: resting comfortably, no acute distress HEENT: no scleral icterus, pupils equal round and reactive, no palptable cervical adenopathy,  CV: RRR, no m/rg, no jvd Resp: Clear to auscultation bilaterally GI: abdomen is soft, non-tender, non-distended, normal bowel sounds, no hepatosplenomegaly MSK: extremities are warm, no edema.  Skin: warm, no rash   Diagnostic Studies  02/2012 Echo  LVEF 60-65%, grade I diastolic dysfunction, normal LA size,    05/2016 echo Study Conclusions   - Left ventricle: The cavity size was normal. Wall thickness was    normal. Systolic function was vigorous. The estimated ejection    fraction was in the range of 65% to 70%. Wall motion was normal;    there were no regional wall motion abnormalities. Left    ventricular diastolic function parameters were normal.  - Mitral valve: There was trivial regurgitation.  - Right atrium: Central venous pressure (est): 3 mm Hg.  - Tricuspid valve: There was trivial regurgitation.  - Pulmonary arteries: Systolic pressure could not be accurately    estimated.  - Pericardium, extracardiac: A probable small pericardial effusion    with organization was identified circumferential to the heart.       Assessment and Plan  1. Tachycardia/PSVT - has done well on dilt 90mg  bid - we will continue current meds   2. HTN -elevated bp, they will update us  with home numbers in 2 weeks, f/u those results prior to any medication adjustment     Dorn PHEBE Ross, M.D.,

## 2023-05-24 NOTE — Patient Instructions (Addendum)
 Medication Instructions:  Your physician recommends that you continue on your current medications as directed. Please refer to the Current Medication list given to you today.   Labwork: None  Testing/Procedures: None  Follow-Up: Your physician recommends that you schedule a follow-up appointment in: 6 months  Any Other Special Instructions Will Be Listed Below (If Applicable).  Please update us  in 2 weeks with heart rates and blood pressure by calling the office or uploading them on MyChart  Thank you for choosing [facility]!     If you need a refill on your cardiac medications before your next appointment, please call your pharmacy.

## 2023-05-26 DIAGNOSIS — G4731 Primary central sleep apnea: Secondary | ICD-10-CM | POA: Diagnosis not present

## 2023-05-29 ENCOUNTER — Emergency Department (HOSPITAL_COMMUNITY)

## 2023-05-29 ENCOUNTER — Inpatient Hospital Stay (HOSPITAL_COMMUNITY)
Admission: EM | Admit: 2023-05-29 | Discharge: 2023-05-31 | DRG: 391 | Disposition: A | Discharge status: Home Health | Attending: Family Medicine | Admitting: Family Medicine

## 2023-05-29 ENCOUNTER — Other Ambulatory Visit: Payer: Self-pay

## 2023-05-29 ENCOUNTER — Encounter (HOSPITAL_COMMUNITY): Payer: Self-pay

## 2023-05-29 DIAGNOSIS — I959 Hypotension, unspecified: Secondary | ICD-10-CM | POA: Diagnosis present

## 2023-05-29 DIAGNOSIS — Z515 Encounter for palliative care: Secondary | ICD-10-CM | POA: Diagnosis not present

## 2023-05-29 DIAGNOSIS — Z8249 Family history of ischemic heart disease and other diseases of the circulatory system: Secondary | ICD-10-CM | POA: Diagnosis not present

## 2023-05-29 DIAGNOSIS — I10 Essential (primary) hypertension: Secondary | ICD-10-CM | POA: Diagnosis present

## 2023-05-29 DIAGNOSIS — I471 Supraventricular tachycardia, unspecified: Secondary | ICD-10-CM | POA: Diagnosis present

## 2023-05-29 DIAGNOSIS — Z880 Allergy status to penicillin: Secondary | ICD-10-CM

## 2023-05-29 DIAGNOSIS — Z7401 Bed confinement status: Secondary | ICD-10-CM

## 2023-05-29 DIAGNOSIS — K529 Noninfective gastroenteritis and colitis, unspecified: Secondary | ICD-10-CM | POA: Diagnosis present

## 2023-05-29 DIAGNOSIS — G919 Hydrocephalus, unspecified: Secondary | ICD-10-CM | POA: Diagnosis present

## 2023-05-29 DIAGNOSIS — D509 Iron deficiency anemia, unspecified: Secondary | ICD-10-CM | POA: Diagnosis present

## 2023-05-29 DIAGNOSIS — N281 Cyst of kidney, acquired: Secondary | ICD-10-CM | POA: Diagnosis not present

## 2023-05-29 DIAGNOSIS — R197 Diarrhea, unspecified: Secondary | ICD-10-CM | POA: Diagnosis not present

## 2023-05-29 DIAGNOSIS — G4731 Primary central sleep apnea: Secondary | ICD-10-CM | POA: Diagnosis present

## 2023-05-29 DIAGNOSIS — G40909 Epilepsy, unspecified, not intractable, without status epilepticus: Secondary | ICD-10-CM | POA: Diagnosis present

## 2023-05-29 DIAGNOSIS — R112 Nausea with vomiting, unspecified: Secondary | ICD-10-CM | POA: Diagnosis not present

## 2023-05-29 DIAGNOSIS — G800 Spastic quadriplegic cerebral palsy: Secondary | ICD-10-CM | POA: Diagnosis present

## 2023-05-29 DIAGNOSIS — Z905 Acquired absence of kidney: Secondary | ICD-10-CM

## 2023-05-29 DIAGNOSIS — Z79899 Other long term (current) drug therapy: Secondary | ICD-10-CM

## 2023-05-29 DIAGNOSIS — E440 Moderate protein-calorie malnutrition: Secondary | ICD-10-CM | POA: Diagnosis present

## 2023-05-29 DIAGNOSIS — J9811 Atelectasis: Secondary | ICD-10-CM | POA: Diagnosis present

## 2023-05-29 DIAGNOSIS — Z982 Presence of cerebrospinal fluid drainage device: Secondary | ICD-10-CM

## 2023-05-29 DIAGNOSIS — Z1152 Encounter for screening for COVID-19: Secondary | ICD-10-CM

## 2023-05-29 DIAGNOSIS — Z681 Body mass index (BMI) 19 or less, adult: Secondary | ICD-10-CM | POA: Diagnosis not present

## 2023-05-29 DIAGNOSIS — Z8262 Family history of osteoporosis: Secondary | ICD-10-CM

## 2023-05-29 DIAGNOSIS — Z87442 Personal history of urinary calculi: Secondary | ICD-10-CM

## 2023-05-29 DIAGNOSIS — K2100 Gastro-esophageal reflux disease with esophagitis, without bleeding: Secondary | ICD-10-CM | POA: Diagnosis present

## 2023-05-29 DIAGNOSIS — K592 Neurogenic bowel, not elsewhere classified: Secondary | ICD-10-CM | POA: Diagnosis present

## 2023-05-29 DIAGNOSIS — K209 Esophagitis, unspecified without bleeding: Secondary | ICD-10-CM | POA: Insufficient documentation

## 2023-05-29 DIAGNOSIS — Z87440 Personal history of urinary (tract) infections: Secondary | ICD-10-CM

## 2023-05-29 DIAGNOSIS — E860 Dehydration: Secondary | ICD-10-CM | POA: Diagnosis present

## 2023-05-29 DIAGNOSIS — R6510 Systemic inflammatory response syndrome (SIRS) of non-infectious origin without acute organ dysfunction: Secondary | ICD-10-CM | POA: Diagnosis present

## 2023-05-29 DIAGNOSIS — R0602 Shortness of breath: Secondary | ICD-10-CM | POA: Diagnosis not present

## 2023-05-29 DIAGNOSIS — N319 Neuromuscular dysfunction of bladder, unspecified: Secondary | ICD-10-CM | POA: Diagnosis present

## 2023-05-29 DIAGNOSIS — A419 Sepsis, unspecified organism: Principal | ICD-10-CM | POA: Diagnosis present

## 2023-05-29 DIAGNOSIS — E861 Hypovolemia: Secondary | ICD-10-CM | POA: Diagnosis present

## 2023-05-29 DIAGNOSIS — Z881 Allergy status to other antibiotic agents status: Secondary | ICD-10-CM

## 2023-05-29 DIAGNOSIS — N400 Enlarged prostate without lower urinary tract symptoms: Secondary | ICD-10-CM | POA: Diagnosis present

## 2023-05-29 DIAGNOSIS — Z800 Family history of malignant neoplasm of digestive organs: Secondary | ICD-10-CM

## 2023-05-29 DIAGNOSIS — K429 Umbilical hernia without obstruction or gangrene: Secondary | ICD-10-CM | POA: Diagnosis not present

## 2023-05-29 DIAGNOSIS — Z7189 Other specified counseling: Secondary | ICD-10-CM | POA: Diagnosis not present

## 2023-05-29 DIAGNOSIS — Z888 Allergy status to other drugs, medicaments and biological substances status: Secondary | ICD-10-CM

## 2023-05-29 DIAGNOSIS — R1110 Vomiting, unspecified: Secondary | ICD-10-CM | POA: Diagnosis not present

## 2023-05-29 DIAGNOSIS — Z882 Allergy status to sulfonamides status: Secondary | ICD-10-CM

## 2023-05-29 DIAGNOSIS — G4733 Obstructive sleep apnea (adult) (pediatric): Secondary | ICD-10-CM | POA: Diagnosis present

## 2023-05-29 LAB — CBC WITH DIFFERENTIAL/PLATELET
Abs Immature Granulocytes: 0.04 10*3/uL (ref 0.00–0.07)
Basophils Absolute: 0 10*3/uL (ref 0.0–0.1)
Basophils Relative: 0 %
Eosinophils Absolute: 0.1 10*3/uL (ref 0.0–0.5)
Eosinophils Relative: 1 %
HCT: 52.1 % — ABNORMAL HIGH (ref 39.0–52.0)
Hemoglobin: 17.4 g/dL — ABNORMAL HIGH (ref 13.0–17.0)
Immature Granulocytes: 0 %
Lymphocytes Relative: 8 %
Lymphs Abs: 1 10*3/uL (ref 0.7–4.0)
MCH: 30.4 pg (ref 26.0–34.0)
MCHC: 33.4 g/dL (ref 30.0–36.0)
MCV: 91.1 fL (ref 80.0–100.0)
Monocytes Absolute: 0.9 10*3/uL (ref 0.1–1.0)
Monocytes Relative: 7 %
Neutro Abs: 10.6 10*3/uL — ABNORMAL HIGH (ref 1.7–7.7)
Neutrophils Relative %: 84 %
Platelets: 248 10*3/uL (ref 150–400)
RBC: 5.72 MIL/uL (ref 4.22–5.81)
RDW: 14.7 % (ref 11.5–15.5)
WBC: 12.6 10*3/uL — ABNORMAL HIGH (ref 4.0–10.5)
nRBC: 0 % (ref 0.0–0.2)

## 2023-05-29 LAB — COMPREHENSIVE METABOLIC PANEL WITH GFR
ALT: 11 U/L (ref 0–44)
AST: 17 U/L (ref 15–41)
Albumin: 4.4 g/dL (ref 3.5–5.0)
Alkaline Phosphatase: 135 U/L — ABNORMAL HIGH (ref 38–126)
Anion gap: 11 (ref 5–15)
BUN: 21 mg/dL — ABNORMAL HIGH (ref 6–20)
CO2: 25 mmol/L (ref 22–32)
Calcium: 9.7 mg/dL (ref 8.9–10.3)
Chloride: 103 mmol/L (ref 98–111)
Creatinine, Ser: 0.68 mg/dL (ref 0.61–1.24)
GFR, Estimated: >60 mL/min (ref >60)
Glucose, Bld: 136 mg/dL — ABNORMAL HIGH (ref 70–99)
Potassium: 4.2 mmol/L (ref 3.5–5.1)
Sodium: 139 mmol/L (ref 135–145)
Total Bilirubin: 0.4 mg/dL (ref 0.0–1.2)
Total Protein: 8.6 g/dL — ABNORMAL HIGH (ref 6.5–8.1)

## 2023-05-29 LAB — RESP PANEL BY RT-PCR (RSV, FLU A&B, COVID)  RVPGX2
Influenza A by PCR: NEGATIVE
Influenza B by PCR: NEGATIVE
Resp Syncytial Virus by PCR: NEGATIVE
SARS Coronavirus 2 by RT PCR: NEGATIVE

## 2023-05-29 MED ORDER — IOHEXOL 300 MG/ML  SOLN
80.0000 mL | Freq: Once | INTRAMUSCULAR | Status: AC | PRN
  Administered 2023-05-29: 80 mL via INTRAVENOUS

## 2023-05-29 MED ORDER — ACETAMINOPHEN 500 MG PO TABS
1000.0000 mg | ORAL_TABLET | Freq: Once | ORAL | Status: AC
  Administered 2023-05-29: 1000 mg via ORAL
  Filled 2023-05-29: qty 2

## 2023-05-29 MED ORDER — LACTATED RINGERS IV BOLUS
1000.0000 mL | Freq: Once | INTRAVENOUS | Status: AC
  Administered 2023-05-30: 1000 mL via INTRAVENOUS

## 2023-05-29 MED ORDER — ONDANSETRON HCL 4 MG/2ML IJ SOLN
4.0000 mg | Freq: Once | INTRAMUSCULAR | Status: AC
  Administered 2023-05-29: 4 mg via INTRAVENOUS
  Filled 2023-05-29: qty 2

## 2023-05-29 MED ORDER — LACTATED RINGERS IV BOLUS
1000.0000 mL | Freq: Once | INTRAVENOUS | Status: AC
  Administered 2023-05-29: 1000 mL via INTRAVENOUS

## 2023-05-29 NOTE — ED Notes (Signed)
 Patient transported to CT

## 2023-05-29 NOTE — ED Provider Notes (Incomplete)
 [place] EMERGENCY DEPARTMENT AT [facility] Provider Note   CSN: [id number] Arrival date & time: 05/29/23  1829     History {Add pertinent medical, surgical, social history, OB history to HPI:1} Chief Complaint  Patient presents with   Emesis    Joshua Logan is a 43 y.o. male.   Emesis      Home Medications Prior to Admission medications   Medication Sig Start Date End Date Taking? Authorizing Provider  BACLOFEN  IT 1,449 mcg by Intrathecal route continuous.    [provider]  cephALEXin (KEFLEX) 250 MG capsule Take 250 mg by mouth See admin instructions. 90 day cycle 12/15/15   [provider]  diltiazem  (CARDIZEM  SR) 90 MG 12 hr capsule TAKE 1 CAPSULE(90 MG) BY MOUTH TWICE DAILY 04/12/23   Laurann Pollock, MD  ketoconazole (NIZORAL) 2 % cream Apply 1 Application topically daily as needed for irritation.    [provider]  ketoconazole (NIZORAL) 2 % shampoo Apply 1 application  topically every other day. 11/20/12   [provider]  Lacosamide  (VIMPAT ) 150 MG TABS Take 150 mg by mouth every morning.    [provider]  lacosamide  (VIMPAT ) 200 MG TABS tablet Take 200 mg by mouth at bedtime.    [provider]  lidocaine -prilocaine (EMLA) cream Apply 1 Application topically as needed (pain). 12/01/20   [provider]  LINZESS  145 MCG CAPS Take 145 mcg by mouth daily.  09/06/11   [provider]  nitrofurantoin (MACRODANTIN) 100 MG capsule Take 100 mg by mouth daily. 90 day cycle 08/25/21   [provider]  omeprazole (PRILOSEC) 40 MG capsule Take 40 mg by mouth 2 (two) times daily. 09/02/14   [provider]  polyethylene glycol (MIRALAX  / GLYCOLAX ) packet Take 8.5 g by mouth daily.    [provider]  RESTASIS  0.05 % ophthalmic emulsion Place 1 drop into both eyes 2 (two) times daily. 08/31/20   [provider]  sodium chloride  HYPERTONIC 3 % nebulizer  solution Take 4 mLs by nebulization 3 (three) times daily.    [provider]  sucralfate  (CARAFATE ) 1 GM/10ML suspension Take 500 mg by mouth 2 (two) times daily.    [provider]  tamsulosin  (FLOMAX ) 0.4 MG CAPS capsule Take 0.4 mg by mouth daily. 12/17/12   [provider]  tretinoin (RETIN-A) 0.025 % cream Apply 1 Application topically at bedtime as needed (irritation). Apply a a thin layer to affected areas nightly 20 min after washing. Start with two nights weekly and increase as tolerated. 05/30/13   [provider]  Urea 40 % LOTN Apply 1 Application topically 2 (two) times daily as needed (KERTOSIS PILARIS ON ARMS). APPLY 1-2 TIMES DAILY TO KERTOSIS PILARIS ON ARMS 01/15/14   [provider]      Allergies    Augmentin [amoxicillin-pot clavulanate], Bactrim [sulfamethoxazole-trimethoprim], Doxycycline, Versed  [midazolam ], Whole blood, Cefazolin , Midazolam  hcl, Septra [bactrim], and Vancomycin    Review of Systems   Review of Systems  Gastrointestinal:  Positive for vomiting.    Physical Exam Updated Vital Signs BP (!) 133/105 (BP Location: Right Arm)   Pulse (!) 130   Temp 97.8 F (36.6 C)   Resp 20   Ht 5\' 9"  (1.753 m)   Wt 51.7 kg   SpO2 94%   BMI 16.83 kg/m  Physical Exam  ED Results / Procedures / Treatments   Labs (all labs ordered are listed, but only abnormal results are  displayed) Labs Reviewed  CBC WITH DIFFERENTIAL/PLATELET - Abnormal; Notable for the following components:      Result Value   WBC 12.6 (*)    Hemoglobin 17.4 (*)    HCT 52.1 (*)    Neutro Abs 10.6 (*)    All other components within normal limits  COMPREHENSIVE METABOLIC PANEL WITH GFR - Abnormal; Notable for the following components:   Glucose, Bld 136 (*)    BUN 21 (*)    Total Protein 8.6 (*)    Alkaline Phosphatase 135 (*)    All other components within normal limits  RESP PANEL BY RT-PCR (RSV, FLU A&B, COVID)  RVPGX2  URINALYSIS, ROUTINE  W REFLEX MICROSCOPIC    EKG None  Radiology DG Chest 2 View Result Date: 05/29/2023 CLINICAL DATA:  Shortness of breath EXAM: CHEST - 2 VIEW COMPARISON:  12/11/2020 FINDINGS: Posterior spinal rods are again noted. No significant pleural effusion. Streaky atelectasis at the bases. Stable cardiomediastinal silhouette. No pneumothorax. Chronic catheter or tubing at the cavoatrial junction with additional tubing or catheter visualized over the neck and left paraspinal region. IMPRESSION: Streaky atelectasis at the bases. Electronically Signed   By: Esmeralda Hedge M.D.   On: 05/29/2023 20:11    Procedures Procedures  {Document cardiac monitor, telemetry assessment procedure when appropriate:1}  Medications Ordered in ED Medications - No data to display  ED Course/ Medical Decision Making/ A&P   {   Click here for ABCD2, HEART and other calculatorsREFRESH Note before signing :1}                              Medical Decision Making Amount and/or Complexity of Data Reviewed Labs: ordered. Radiology: ordered.   ***  {Document critical care time when appropriate:1} {Document review of labs and clinical decision tools ie heart score, Chads2Vasc2 etc:1}  {Document your independent review of radiology images, and any outside records:1} {Document your discussion with family members, caretakers, and with consultants:1} {Document social determinants of health affecting pt's care:1} {Document your decision making why or why not admission, treatments were needed:1} Final Clinical Impression(s) / ED Diagnoses Final diagnoses:  None    Rx / DC Orders ED Discharge Orders     None

## 2023-05-29 NOTE — ED Triage Notes (Signed)
 Pt arrived via POV c/o emesis that began this morning. Pts mother is primary historian and caretaker and reports Pt has had 2 days of congestion and clear nasal drainage. Pts mother reports mucus has been thick and green.

## 2023-05-29 NOTE — ED Provider Notes (Signed)
 [place] EMERGENCY DEPARTMENT AT [facility] Provider Note   CSN: [id number] Arrival date & time: 05/29/23  1829     History {Add pertinent medical, surgical, social history, OB history to HPI:1} Chief Complaint  Patient presents with  . Emesis    Joshua Logan is a 43 y.o. male. Have his extensive PMH including hydrocephalus, cerebral palsy, seizures, kidney stones, sleep apnea, tachycardia.  He presents the ER for nausea vomiting and diarrhea.  The vomiting started this morning.  She notes that he has had a runny nose for the past 2 days, had some thick green mucus this morning.  After he ate his oatmeal for breakfast he vomited a small amount.  He seemed that he was doing okay and she fed him some scrambled eggs for lunch and he had some vomiting again.  He was not keeping anything down so she was worried about him becoming dehydrated and also that he cannot keep his medications down and decided to bring him to the ER for further evaluation.  At that time he started having diarrhea and has had multiple large liquid bowel movements.  No hematemesis or hematochezia noted, no melena.    Emesis      Home Medications Prior to Admission medications   Medication Sig Start Date End Date Taking? Authorizing Provider  BACLOFEN  IT 1,449 mcg by Intrathecal route continuous.    [provider]  cephALEXin (KEFLEX) 250 MG capsule Take 250 mg by mouth See admin instructions. 90 day cycle 12/15/15   [provider]  diltiazem  (CARDIZEM  SR) 90 MG 12 hr capsule TAKE 1 CAPSULE(90 MG) BY MOUTH TWICE DAILY 04/12/23   Laurann Pollock, MD  ketoconazole (NIZORAL) 2 % cream Apply 1 Application topically daily as needed for irritation.    [provider]  ketoconazole (NIZORAL) 2 % shampoo Apply 1 application  topically every other day. 11/20/12   [provider]  Lacosamide  (VIMPAT ) 150 MG TABS Take 150 mg by mouth every morning.    [provider]  lacosamide  (VIMPAT ) 200 MG TABS tablet Take 200 mg by mouth at bedtime.    [provider]  lidocaine -prilocaine (EMLA) cream Apply 1 Application topically as needed (pain). 12/01/20   [provider]  LINZESS  145 MCG CAPS Take 145 mcg by mouth daily.  09/06/11   [provider]  nitrofurantoin (MACRODANTIN) 100 MG capsule Take 100 mg by mouth daily. 90 day cycle 08/25/21   [provider]  omeprazole (PRILOSEC) 40 MG capsule Take 40 mg by mouth 2 (two) times daily. 09/02/14   [provider]  polyethylene glycol (MIRALAX  / GLYCOLAX ) packet Take 8.5 g by mouth daily.    [provider]  RESTASIS  0.05 % ophthalmic emulsion Place 1 drop into both eyes 2 (two) times daily. 08/31/20   [provider]  sodium chloride  HYPERTONIC 3 % nebulizer solution Take 4 mLs by nebulization 3 (three) times daily.    [provider]  sucralfate  (CARAFATE ) 1 GM/10ML suspension Take 500 mg by mouth 2 (two) times daily.    [provider]  tamsulosin  (FLOMAX ) 0.4 MG CAPS capsule Take 0.4 mg by mouth daily. 12/17/12   [provider]  tretinoin (RETIN-A) 0.025 % cream Apply 1 Application topically at bedtime as needed (irritation). Apply a a thin layer to affected areas nightly 20 min after washing. Start with two nights weekly and increase as tolerated. 05/30/13   [provider]  Urea 40 %  LOTN Apply 1 Application topically 2 (two) times daily as needed (KERTOSIS PILARIS ON ARMS). APPLY 1-2 TIMES DAILY TO KERTOSIS PILARIS ON ARMS 01/15/14   [provider]      Allergies    Augmentin [amoxicillin-pot clavulanate], Bactrim [sulfamethoxazole-trimethoprim], Doxycycline, Versed  [midazolam ], Whole blood, Cefazolin , Midazolam  hcl, Septra [bactrim], and Vancomycin    Review of Systems   Review of Systems  Gastrointestinal:  Positive for vomiting.    Physical Exam Updated Vital Signs BP (!) 133/105 (BP Location:  Right Arm)   Pulse (!) 130   Temp 97.8 F (36.6 C)   Resp 20   Ht 5\' 9"  (1.753 m)   Wt 51.7 kg   SpO2 94%   BMI 16.83 kg/m  Physical Exam Vitals and nursing note reviewed.  Constitutional:      General: He is not in acute distress.    Appearance: He is well-developed.  HENT:     Head: Normocephalic and atraumatic.     Mouth/Throat:     Mouth: Mucous membranes are moist.  Eyes:     Conjunctiva/sclera: Conjunctivae normal.  Cardiovascular:     Rate and Rhythm: Normal rate and regular rhythm.     Heart sounds: No murmur heard. Pulmonary:     Effort: Pulmonary effort is normal. No respiratory distress.     Breath sounds: Normal breath sounds.  Abdominal:     Palpations: Abdomen is soft.     Tenderness: There is no abdominal tenderness.  Musculoskeletal:        General: No swelling.     Cervical back: Neck supple.  Skin:    General: Skin is warm and dry.     Capillary Refill: Capillary refill takes less than 2 seconds.  Neurological:     General: No focal deficit present.     Mental Status: He is alert and oriented to person, place, and time.  Psychiatric:        Mood and Affect: Mood normal.    ED Results / Procedures / Treatments   Labs (all labs ordered are listed, but only abnormal results are displayed) Labs Reviewed  CBC WITH DIFFERENTIAL/PLATELET - Abnormal; Notable for the following components:      Result Value   WBC 12.6 (*)    Hemoglobin 17.4 (*)    HCT 52.1 (*)    Neutro Abs 10.6 (*)    All other components within normal limits  COMPREHENSIVE METABOLIC PANEL WITH GFR - Abnormal; Notable for the following components:   Glucose, Bld 136 (*)    BUN 21 (*)    Total Protein 8.6 (*)    Alkaline Phosphatase 135 (*)    All other components within normal limits  RESP PANEL BY RT-PCR (RSV, FLU A&B, COVID)  RVPGX2  URINALYSIS, ROUTINE W REFLEX MICROSCOPIC    EKG None  Radiology DG Chest 2 View Result Date: 05/29/2023 CLINICAL DATA:  Shortness of breath  EXAM: CHEST - 2 VIEW COMPARISON:  12/11/2020 FINDINGS: Posterior spinal rods are again noted. No significant pleural effusion. Streaky atelectasis at the bases. Stable cardiomediastinal silhouette. No pneumothorax. Chronic catheter or tubing at the cavoatrial junction with additional tubing or catheter visualized over the neck and left paraspinal region. IMPRESSION: Streaky atelectasis at the bases. Electronically Signed   By: Esmeralda Hedge M.D.   On: 05/29/2023 20:11    Procedures Procedures  {Document cardiac monitor, telemetry assessment procedure when appropriate:1}  Medications Ordered in ED Medications  lactated ringers  bolus 1,000 mL (has no administration in  time range)    ED Course/ Medical Decision Making/ A&P   {   Click here for ABCD2, HEART and other calculatorsREFRESH Note before signing :1}                              Medical Decision Making Amount and/or Complexity of Data Reviewed Labs: ordered. Radiology: ordered.   ***  {Document critical care time when appropriate:1} {Document review of labs and clinical decision tools ie heart score, Chads2Vasc2 etc:1}  {Document your independent review of radiology images, and any outside records:1} {Document your discussion with family members, caretakers, and with consultants:1} {Document social determinants of health affecting pt's care:1} {Document your decision making why or why not admission, treatments were needed:1} Final Clinical Impression(s) / ED Diagnoses Final diagnoses:  None    Rx / DC Orders ED Discharge Orders     None

## 2023-05-29 NOTE — ED Notes (Signed)
 Pt's mother states if pt needs to be cath he will need to use flexi coude due to his bladder being sideways.

## 2023-05-30 ENCOUNTER — Encounter (HOSPITAL_COMMUNITY): Payer: Self-pay | Admitting: Internal Medicine

## 2023-05-30 DIAGNOSIS — E440 Moderate protein-calorie malnutrition: Secondary | ICD-10-CM | POA: Diagnosis present

## 2023-05-30 DIAGNOSIS — K209 Esophagitis, unspecified without bleeding: Secondary | ICD-10-CM | POA: Insufficient documentation

## 2023-05-30 DIAGNOSIS — E860 Dehydration: Secondary | ICD-10-CM | POA: Diagnosis present

## 2023-05-30 DIAGNOSIS — K592 Neurogenic bowel, not elsewhere classified: Secondary | ICD-10-CM | POA: Diagnosis present

## 2023-05-30 DIAGNOSIS — G800 Spastic quadriplegic cerebral palsy: Secondary | ICD-10-CM | POA: Diagnosis present

## 2023-05-30 DIAGNOSIS — K529 Noninfective gastroenteritis and colitis, unspecified: Secondary | ICD-10-CM | POA: Diagnosis present

## 2023-05-30 DIAGNOSIS — Z681 Body mass index (BMI) 19 or less, adult: Secondary | ICD-10-CM | POA: Diagnosis not present

## 2023-05-30 DIAGNOSIS — Z515 Encounter for palliative care: Secondary | ICD-10-CM

## 2023-05-30 DIAGNOSIS — G919 Hydrocephalus, unspecified: Secondary | ICD-10-CM | POA: Diagnosis present

## 2023-05-30 DIAGNOSIS — I10 Essential (primary) hypertension: Secondary | ICD-10-CM | POA: Diagnosis present

## 2023-05-30 DIAGNOSIS — J9811 Atelectasis: Secondary | ICD-10-CM | POA: Diagnosis present

## 2023-05-30 DIAGNOSIS — A419 Sepsis, unspecified organism: Secondary | ICD-10-CM

## 2023-05-30 DIAGNOSIS — Z1152 Encounter for screening for COVID-19: Secondary | ICD-10-CM | POA: Diagnosis not present

## 2023-05-30 DIAGNOSIS — Z7189 Other specified counseling: Secondary | ICD-10-CM | POA: Diagnosis not present

## 2023-05-30 DIAGNOSIS — N400 Enlarged prostate without lower urinary tract symptoms: Secondary | ICD-10-CM | POA: Diagnosis present

## 2023-05-30 DIAGNOSIS — Z905 Acquired absence of kidney: Secondary | ICD-10-CM | POA: Diagnosis not present

## 2023-05-30 DIAGNOSIS — Z8249 Family history of ischemic heart disease and other diseases of the circulatory system: Secondary | ICD-10-CM | POA: Diagnosis not present

## 2023-05-30 DIAGNOSIS — D509 Iron deficiency anemia, unspecified: Secondary | ICD-10-CM | POA: Diagnosis present

## 2023-05-30 DIAGNOSIS — N319 Neuromuscular dysfunction of bladder, unspecified: Secondary | ICD-10-CM | POA: Diagnosis present

## 2023-05-30 DIAGNOSIS — Z880 Allergy status to penicillin: Secondary | ICD-10-CM | POA: Diagnosis not present

## 2023-05-30 DIAGNOSIS — G4733 Obstructive sleep apnea (adult) (pediatric): Secondary | ICD-10-CM | POA: Diagnosis present

## 2023-05-30 DIAGNOSIS — K2100 Gastro-esophageal reflux disease with esophagitis, without bleeding: Secondary | ICD-10-CM | POA: Diagnosis present

## 2023-05-30 DIAGNOSIS — Z79899 Other long term (current) drug therapy: Secondary | ICD-10-CM | POA: Diagnosis not present

## 2023-05-30 DIAGNOSIS — I471 Supraventricular tachycardia, unspecified: Secondary | ICD-10-CM | POA: Diagnosis present

## 2023-05-30 DIAGNOSIS — G40909 Epilepsy, unspecified, not intractable, without status epilepticus: Secondary | ICD-10-CM | POA: Diagnosis present

## 2023-05-30 DIAGNOSIS — Z881 Allergy status to other antibiotic agents status: Secondary | ICD-10-CM | POA: Diagnosis not present

## 2023-05-30 DIAGNOSIS — R6510 Systemic inflammatory response syndrome (SIRS) of non-infectious origin without acute organ dysfunction: Secondary | ICD-10-CM | POA: Diagnosis present

## 2023-05-30 DIAGNOSIS — E861 Hypovolemia: Secondary | ICD-10-CM | POA: Diagnosis present

## 2023-05-30 LAB — CBC
HCT: 39 % (ref 39.0–52.0)
Hemoglobin: 13.2 g/dL (ref 13.0–17.0)
MCH: 31 pg (ref 26.0–34.0)
MCHC: 33.8 g/dL (ref 30.0–36.0)
MCV: 91.5 fL (ref 80.0–100.0)
Platelets: 216 10*3/uL (ref 150–400)
RBC: 4.26 MIL/uL (ref 4.22–5.81)
RDW: 14.9 % (ref 11.5–15.5)
WBC: 8.7 10*3/uL (ref 4.0–10.5)
nRBC: 0 % (ref 0.0–0.2)

## 2023-05-30 LAB — RESPIRATORY PANEL BY PCR

## 2023-05-30 LAB — URINALYSIS, ROUTINE W REFLEX MICROSCOPIC
Bacteria, UA: NONE SEEN
Bilirubin Urine: NEGATIVE
Glucose, UA: 50 mg/dL — AB
Hgb urine dipstick: NEGATIVE
Ketones, ur: NEGATIVE mg/dL
Leukocytes,Ua: NEGATIVE
Nitrite: NEGATIVE
Protein, ur: 30 mg/dL — AB
Specific Gravity, Urine: >1.046 — ABNORMAL HIGH (ref 1.005–1.030)
pH: 5 (ref 5.0–8.0)

## 2023-05-30 LAB — LACTIC ACID, PLASMA: Lactic Acid, Venous: 1.3 mmol/L (ref 0.5–1.9)

## 2023-05-30 LAB — HIV ANTIBODY (ROUTINE TESTING W REFLEX): HIV Screen 4th Generation wRfx: NONREACTIVE

## 2023-05-30 LAB — BASIC METABOLIC PANEL WITH GFR
Anion gap: 6 (ref 5–15)
BUN: 16 mg/dL (ref 6–20)
CO2: 26 mmol/L (ref 22–32)
Calcium: 8.5 mg/dL — ABNORMAL LOW (ref 8.9–10.3)
Chloride: 108 mmol/L (ref 98–111)
Creatinine, Ser: 0.68 mg/dL (ref 0.61–1.24)
GFR, Estimated: >60 mL/min (ref >60)
Glucose, Bld: 82 mg/dL (ref 70–99)
Potassium: 4.2 mmol/L (ref 3.5–5.1)
Sodium: 140 mmol/L (ref 135–145)

## 2023-05-30 LAB — C DIFFICILE QUICK SCREEN W PCR REFLEX
C Diff antigen: NEGATIVE
C Diff interpretation: NOT DETECTED
C Diff toxin: NEGATIVE

## 2023-05-30 LAB — PHOSPHORUS: Phosphorus: 3.3 mg/dL (ref 2.5–4.6)

## 2023-05-30 LAB — MAGNESIUM: Magnesium: 2.1 mg/dL (ref 1.7–2.4)

## 2023-05-30 LAB — TSH: TSH: 0.867 u[IU]/mL (ref 0.350–4.500)

## 2023-05-30 MED ORDER — TAMSULOSIN HCL 0.4 MG PO CAPS
0.4000 mg | ORAL_CAPSULE | Freq: Every day | ORAL | Status: DC
Start: 2023-05-30 — End: 2023-05-31
  Administered 2023-05-30 – 2023-05-31 (×2): 0.4 mg via ORAL
  Filled 2023-05-30 (×2): qty 1

## 2023-05-30 MED ORDER — SODIUM CHLORIDE 0.9 % IV SOLN
2.0000 g | Freq: Once | INTRAVENOUS | Status: AC
  Administered 2023-05-30: 2 g via INTRAVENOUS
  Filled 2023-05-30: qty 12.5

## 2023-05-30 MED ORDER — ENOXAPARIN SODIUM 40 MG/0.4ML IJ SOSY
40.0000 mg | PREFILLED_SYRINGE | INTRAMUSCULAR | Status: DC
  Administered 2023-05-30 – 2023-05-31 (×2): 40 mg via SUBCUTANEOUS
  Filled 2023-05-30 (×2): qty 0.4

## 2023-05-30 MED ORDER — LACTATED RINGERS IV SOLN: INTRAVENOUS | Status: AC

## 2023-05-30 MED ORDER — ONDANSETRON HCL 4 MG/2ML IJ SOLN
4.0000 mg | Freq: Three times a day (TID) | INTRAMUSCULAR | Status: DC
  Administered 2023-05-30 – 2023-05-31 (×5): 4 mg via INTRAVENOUS
  Filled 2023-05-30 (×5): qty 2

## 2023-05-30 MED ORDER — CIPROFLOXACIN IN D5W 400 MG/200ML IV SOLN
400.0000 mg | Freq: Two times a day (BID) | INTRAVENOUS | Status: DC
  Administered 2023-05-30 – 2023-05-31 (×3): 400 mg via INTRAVENOUS
  Filled 2023-05-30 (×3): qty 200

## 2023-05-30 MED ORDER — ALBUTEROL SULFATE (2.5 MG/3ML) 0.083% IN NEBU
2.5000 mg | INHALATION_SOLUTION | Freq: Three times a day (TID) | RESPIRATORY_TRACT | Status: DC
  Administered 2023-05-30 – 2023-05-31 (×5): 2.5 mg via RESPIRATORY_TRACT
  Filled 2023-05-30 (×5): qty 3

## 2023-05-30 MED ORDER — LACTATED RINGERS IV SOLN: INTRAVENOUS | Status: DC

## 2023-05-30 MED ORDER — LACOSAMIDE 50 MG PO TABS
200.0000 mg | ORAL_TABLET | Freq: Two times a day (BID) | ORAL | Status: DC
  Administered 2023-05-30 – 2023-05-31 (×3): 200 mg via ORAL
  Filled 2023-05-30 (×3): qty 4

## 2023-05-30 MED ORDER — FLORANEX PO PACK
1.0000 g | PACK | Freq: Three times a day (TID) | ORAL | Status: DC
  Administered 2023-05-30 – 2023-05-31 (×4): 1 g via ORAL
  Filled 2023-05-30 (×8): qty 1

## 2023-05-30 MED ORDER — SODIUM CHLORIDE 3 % IN NEBU
4.0000 mL | INHALATION_SOLUTION | Freq: Three times a day (TID) | RESPIRATORY_TRACT | Status: DC
  Administered 2023-05-30 – 2023-05-31 (×5): 4 mL via RESPIRATORY_TRACT
  Filled 2023-05-30 (×9): qty 4

## 2023-05-30 MED ORDER — LINACLOTIDE 145 MCG PO CAPS
145.0000 ug | ORAL_CAPSULE | Freq: Every day | ORAL | Status: DC
  Administered 2023-05-31: 145 ug via ORAL
  Filled 2023-05-30 (×3): qty 1

## 2023-05-30 MED ORDER — SODIUM CHLORIDE 0.9% FLUSH
3.0000 mL | Freq: Two times a day (BID) | INTRAVENOUS | Status: DC
  Administered 2023-05-30 – 2023-05-31 (×3): 3 mL via INTRAVENOUS

## 2023-05-30 MED ORDER — SUCRALFATE 1 GM/10ML PO SUSP
1.0000 g | Freq: Three times a day (TID) | ORAL | Status: DC
Start: 2023-05-30 — End: 2023-05-31
  Administered 2023-05-30 – 2023-05-31 (×5): 1 g via ORAL
  Filled 2023-05-30 (×6): qty 10

## 2023-05-30 MED ORDER — CYCLOSPORINE 0.05 % OP EMUL
1.0000 [drp] | Freq: Two times a day (BID) | OPHTHALMIC | Status: DC
  Administered 2023-05-30 – 2023-05-31 (×2): 1 [drp] via OPHTHALMIC
  Filled 2023-05-30 (×2): qty 30

## 2023-05-30 MED ORDER — DILTIAZEM HCL ER 60 MG PO CP12
60.0000 mg | ORAL_CAPSULE | Freq: Two times a day (BID) | ORAL | Status: DC
  Administered 2023-05-30 – 2023-05-31 (×3): 60 mg via ORAL
  Filled 2023-05-30 (×6): qty 1

## 2023-05-30 MED ORDER — BACLOFEN 10 MG/20ML IT SOLN: 1449.0000 ug | INTRATHECAL | Status: DC

## 2023-05-30 MED ORDER — CHLORHEXIDINE GLUCONATE CLOTH 2 % EX PADS
6.0000 | MEDICATED_PAD | Freq: Every day | CUTANEOUS | Status: DC
  Administered 2023-05-30: 6 via TOPICAL

## 2023-05-30 MED ORDER — METRONIDAZOLE 500 MG/100ML IV SOLN
500.0000 mg | Freq: Two times a day (BID) | INTRAVENOUS | Status: DC
  Administered 2023-05-30 – 2023-05-31 (×4): 500 mg via INTRAVENOUS
  Filled 2023-05-30 (×4): qty 100

## 2023-05-30 MED ORDER — PANTOPRAZOLE SODIUM 40 MG PO TBEC
40.0000 mg | DELAYED_RELEASE_TABLET | Freq: Two times a day (BID) | ORAL | Status: DC
  Administered 2023-05-30: 40 mg via ORAL
  Filled 2023-05-30: qty 1

## 2023-05-30 NOTE — H&P (Addendum)
 History and Physical    Joshua C Szalkowski ZOX:[id number] DOB: 1980-11-24 DOA: 05/29/2023  PCP: Orest Bio, MD   Patient coming from: Home   Chief Complaint:  Chief Complaint  Patient presents with   Emesis    HPI: History limited due to patient's nonverbal status, provided by mother at bedside. Joshua Logan is a 43 y.o. male with hx of cerebral palsy with spastic quadriparesis, on baclofen  pump, neurogenic bowel and bladder, history hydrocephalus and shunt, seizure disorder, SVT, hypertension, who presented due to acute onset of vomiting and diarrhea.  Mother reports that he had been in normal state of health until a few days ago noted that he was having some sinus drainage and also having eructation and thought he may be having reflux.  This morning he suddenly developed vomiting with large watery emesis and into the afternoon developing copious watery diarrhea.  No fevers, chills, cough, abdominal pain, black/bloody stools, urinary changes, rashes.   Review of Systems:  ROS complete and negative except as marked above   Allergies  Allergen Reactions   Augmentin [Amoxicillin-Pot Clavulanate] Rash and Other (See Comments)    Blisters. Pt can take Amoxicillin, but NOT Augmentin    Bactrim [Sulfamethoxazole-Trimethoprim] Rash   Doxycycline Other (See Comments)    Bad reflux- long term use    Versed  [Midazolam ]     Eyes stayed dilated    Whole Blood     Pushing too fast "Red Man Syndrome"   Cefazolin  Rash   Midazolam  Hcl Other (See Comments)    Un-reactive pupils after surgery   Septra [Bactrim] Hives   Vancomycin Rash    Prior to Admission medications   Medication Sig Start Date End Date Taking? Authorizing Provider  BACLOFEN  IT 1,449 mcg by Intrathecal route continuous.    [provider]  cephALEXin (KEFLEX) 250 MG capsule Take 250 mg by mouth See admin instructions. 90 day cycle 12/15/15   [provider]  diltiazem  (CARDIZEM  SR) 90 MG 12 hr  capsule TAKE 1 CAPSULE(90 MG) BY MOUTH TWICE DAILY 04/12/23   Laurann Pollock, MD  ketoconazole (NIZORAL) 2 % cream Apply 1 Application topically daily as needed for irritation.    [provider]  ketoconazole (NIZORAL) 2 % shampoo Apply 1 application  topically every other day. 11/20/12   [provider]  Lacosamide  (VIMPAT ) 150 MG TABS Take 150 mg by mouth every morning.    [provider]  lacosamide  (VIMPAT ) 200 MG TABS tablet Take 200 mg by mouth at bedtime.    [provider]  lidocaine -prilocaine (EMLA) cream Apply 1 Application topically as needed (pain). 12/01/20   [provider]  LINZESS  145 MCG CAPS Take 145 mcg by mouth daily.  09/06/11   [provider]  nitrofurantoin (MACRODANTIN) 100 MG capsule Take 100 mg by mouth daily. 90 day cycle 08/25/21   [provider]  omeprazole (PRILOSEC) 40 MG capsule Take 40 mg by mouth 2 (two) times daily. 09/02/14   [provider]  polyethylene glycol (MIRALAX  / GLYCOLAX ) packet Take 8.5 g by mouth daily.    [provider]  RESTASIS  0.05 % ophthalmic emulsion Place 1 drop into both eyes 2 (two) times daily. 08/31/20   [provider]  sodium chloride  HYPERTONIC 3 % nebulizer solution Take 4 mLs by nebulization 3 (three) times daily.    [provider]  sucralfate  (CARAFATE ) 1 GM/10ML suspension Take 500 mg by mouth 2 (two) times daily.    [provider]  tamsulosin  (FLOMAX ) 0.4 MG CAPS capsule Take 0.4 mg by mouth daily. 12/17/12   [provider]  tretinoin (RETIN-A) 0.025 % cream Apply 1 Application topically at bedtime as needed (irritation). Apply a a thin layer to affected areas nightly 20 min after washing. Start with two nights weekly and increase as tolerated. 05/30/13   [provider]  Urea 40 % LOTN Apply 1 Application topically 2 (two) times daily as needed (KERTOSIS PILARIS ON ARMS). APPLY 1-2 TIMES DAILY TO KERTOSIS  PILARIS ON ARMS 01/15/14   [provider]    Past Medical History:  Diagnosis Date   Cerebral palsy (HCC)    with spastic quadriparesis with numerous orthopedic procedures for contractures and fratures   Chronic constipation    Chronic pulmonary aspiration    CSA (central sleep apnea)    Dysrhythmia    PSVT   GERD (gastroesophageal reflux disease)    History of kidney stones    Hydrocephalus (HCC)    s/p VP shunt   Hypoxemia    chronic   Iron deficiency anemia    OSA (obstructive sleep apnea)    PONV (postoperative nausea and vomiting)    once time after having baclofen  pump placed   Protein calorie malnutrition (HCC)    chronic   Seizures (HCC)    Tachycardia    chronic    Past Surgical History:  Procedure Laterality Date   adductor lengthening Bilateral    10/1985   BACK SURGERY     [place] RODS PLACED FOR SCOLIOSIS   BACLOFEN  PUMP PLACEMENT     BRONCHOSCOPY     05/2010- [facility]   CSF SHUNT     CYSTOSCOPY     exploratory after UTI and ureter trauma- 09/2004   EYE SURGERY     MULTIPLE   HIP SURGERY     MULTIPLE; metal plate on right   INGUINAL HERNIA REPAIR     BILATERAL   IRRIGATION AND DEBRIDEMENT SEBACEOUS CYST N/A 09/12/2022   Procedure: EXCISIONOF BACK SEBACEOUS CYST;  Surgeon: Enid Harry, MD;  Location: [facility] OR;  Service: General;  Laterality: N/A;   NEPHRECTOMY Left    [facility]   PATENT DUCTUS ARTERIOUS REPAIR     11/1981   pelvic hardware removal     07/1996   rectis recessions Bilateral    11/1989   removal hip plate     78/2956   RHIZOTOMY     bilateral L5 and S1 radiofrequency rhizotomy- 07/2001   right hip varus derotational osteotomy     05/1990   TOOTH EXTRACTION     2 teeth0 04/1994   VENTRICULAR ATRIAL SHUNT     VENTRICULOPERITONEAL SHUNT     12/1981   WISDOM TOOTH EXTRACTION     06/1996     reports that he has never smoked. He has never been exposed to tobacco smoke. He has never used smokeless tobacco. He  reports that he does not drink alcohol and does not use drugs.  Family History  Problem Relation Age of Onset   Osteoporosis Mother    Osteoarthritis Mother        in her back   Colon cancer Father    Hypertension Father      Physical Exam: Vitals:   05/30/23 0430 05/30/23 0445 05/30/23 0515 05/30/23 0600  BP: 107/72 104/66 99/63 96/64   Pulse: 89 82 73 80  Resp: 11 12 10 10   Temp:      TempSrc:  SpO2: 96% 94% 95% 97%  Weight:      Height:        Gen: Awake, alert, nonverbal, no acute distress CV: Regular, normal S1, S2, no murmurs  Resp: Normal WOB, coarse breath sounds in bases Abd: Round, hyperresonant, normoactive, nontender MSK: Symmetric, no edema  Skin: No rashes or lesions to exposed skin  Neuro: Alert and interactive, nonverbal, spastic quadriparesis Psych: euthymic, appropriate    Data review:   Labs reviewed, notable for:   Lactate 1.3 WBC 12 UA not consistent with infection  Micro:  Results for orders placed or performed during the hospital encounter of 05/29/23  Resp panel by RT-PCR (RSV, Flu A&B, Covid) Anterior Nasal Swab     Status: None   Collection Time: 05/29/23  6:45 PM   Specimen: Anterior Nasal Swab  Result Value Ref Range Status   SARS Coronavirus 2 by RT PCR NEGATIVE NEGATIVE Final    Comment: (NOTE) SARS-CoV-2 target nucleic acids are NOT DETECTED.  The SARS-CoV-2 RNA is generally detectable in upper respiratory specimens during the acute phase of infection. The lowest concentration of SARS-CoV-2 viral copies this assay can detect is 138 copies/mL. A negative result does not preclude SARS-Cov-2 infection and should not be used as the sole basis for treatment or other patient management decisions. A negative result may occur with  improper specimen collection/handling, submission of specimen other than nasopharyngeal swab, presence of viral mutation(s) within the areas targeted by this assay, and inadequate number of  viral copies(<138 copies/mL). A negative result must be combined with clinical observations, patient history, and epidemiological information. The expected result is Negative.  Fact Sheet for Patients:  BloggerCourse.com  Fact Sheet for Healthcare Providers:  SeriousBroker.it  This test is no t yet approved or cleared by the [place]  FDA and  has been authorized for detection and/or diagnosis of SARS-CoV-2 by FDA under an Emergency Use Authorization (EUA). This EUA will remain  in effect (meaning this test can be used) for the duration of the COVID-19 declaration under Section 564(b)(1) of the Act, 21 U.S.C.section 360bbb-3(b)(1), unless the authorization is terminated  or revoked sooner.       Influenza A by PCR NEGATIVE NEGATIVE Final   Influenza B by PCR NEGATIVE NEGATIVE Final    Comment: (NOTE) The Xpert Xpress SARS-CoV-2/FLU/RSV plus assay is intended as an aid in the diagnosis of influenza from Nasopharyngeal swab specimens and should not be used as a sole basis for treatment. Nasal washings and aspirates are unacceptable for Xpert Xpress SARS-CoV-2/FLU/RSV testing.  Fact Sheet for Patients: BloggerCourse.com  Fact Sheet for Healthcare Providers: SeriousBroker.it  This test is not yet approved or cleared by the [place]  FDA and has been authorized for detection and/or diagnosis of SARS-CoV-2 by FDA under an Emergency Use Authorization (EUA). This EUA will remain in effect (meaning this test can be used) for the duration of the COVID-19 declaration under Section 564(b)(1) of the Act, 21 U.S.C. section 360bbb-3(b)(1), unless the authorization is terminated or revoked.     Resp Syncytial Virus by PCR NEGATIVE NEGATIVE Final    Comment: (NOTE) Fact Sheet for Patients: BloggerCourse.com  Fact Sheet for Healthcare  Providers: SeriousBroker.it  This test is not yet approved or cleared by the [place]  FDA and has been authorized for detection and/or diagnosis of SARS-CoV-2 by FDA under an Emergency Use Authorization (EUA). This EUA will remain in effect (meaning this test can be used) for the duration of the COVID-19 declaration under Section  564(b)(1) of the Act, 21 U.S.C. section 360bbb-3(b)(1), unless the authorization is terminated or revoked.  Performed at [facility], [address], [place], [place] [address]   C Difficile Quick Screen w PCR reflex     Status: None   Collection Time: 05/30/23 12:37 AM   Specimen: Urine, Catheterized; Stool  Result Value Ref Range Status   C Diff antigen NEGATIVE NEGATIVE Final   C Diff toxin NEGATIVE NEGATIVE Final   C Diff interpretation No C. difficile detected.  Final    Comment: Performed at [facility], [address] [place], [place] [address]    Imaging reviewed:  CT ABDOMEN PELVIS W CONTRAST Result Date: 05/29/2023 CLINICAL DATA:  Vomiting. EXAM: CT ABDOMEN AND PELVIS WITH CONTRAST TECHNIQUE: Multidetector CT imaging of the abdomen and pelvis was performed using the standard protocol following bolus administration of intravenous contrast. RADIATION DOSE REDUCTION: This exam was performed according to the departmental dose-optimization program which includes automated exposure control, adjustment of the mA and/or kV according to patient size and/or use of iterative reconstruction technique. CONTRAST:  80mL OMNIPAQUE IOHEXOL 300 MG/ML  SOLN COMPARISON:  December 06, 2020 FINDINGS: Lower chest: Mild atelectatic changes are seen within the bilateral lung bases. Hepatobiliary: A 10 mm cyst versus hemangioma (approximately 36.08 Hounsfield units) is seen within the anterior aspect of the right lobe of the liver. No gallstones, gallbladder wall thickening, or biliary dilatation. Pancreas: Unremarkable. No pancreatic ductal  dilatation or surrounding inflammatory changes. Spleen: Normal in size without focal abnormality. Adrenals/Urinary Tract: Adrenal glands are unremarkable. The left kidney is surgically absent. The right kidney is normal in size, without renal calculi or hydronephrosis. Subcentimeter simple cysts are seen within the right kidney. Mild to moderate severity diffuse urinary bladder wall thickening is noted. Stomach/Bowel: There is mild diffuse thickening of the distal esophagus. Stomach is within normal limits. The appendix is not clearly identified. Stool is seen throughout the large bowel. No evidence of bowel wall thickening, distention, or inflammatory changes. Vascular/Lymphatic: No significant vascular findings are present. No enlarged abdominal or pelvic lymph nodes. Reproductive: There is moderate to marked severity prostate gland enlargement. Other: There is a small, stable fat containing umbilical hernia. Abdominopelvic ascites. Musculoskeletal: Extensive postoperative changes are seen throughout the thoracolumbar spine, with additional postoperative changes seen involving the proximal right femur. Congenital deformities are seen involving both hips. No acute osseous abnormalities are identified. IMPRESSION: 1. Mild diffuse thickening of the distal esophagus which may represent sequelae associated with esophagitis. 2. Mild to moderate severity diffuse urinary bladder wall thickening which may be secondary to chronic bladder outlet obstruction. Correlation with urinalysis is recommended to exclude the presence of cystitis. 3. Moderate to marked severity prostate gland enlargement. Correlation with PSA levels is recommended. 4. Small, stable fat containing umbilical hernia. 5. Extensive postoperative changes throughout the thoracolumbar spine and proximal right femur. 6. Congenital deformities involving both hips. Electronically Signed   By: Virgle Grime M.D.   On: 05/29/2023 22:39   DG Chest 2  View Result Date: 05/29/2023 CLINICAL DATA:  Shortness of breath EXAM: CHEST - 2 VIEW COMPARISON:  12/11/2020 FINDINGS: Posterior spinal rods are again noted. No significant pleural effusion. Streaky atelectasis at the bases. Stable cardiomediastinal silhouette. No pneumothorax. Chronic catheter or tubing at the cavoatrial junction with additional tubing or catheter visualized over the neck and left paraspinal region. IMPRESSION: Streaky atelectasis at the bases. Electronically Signed   By: Esmeralda Hedge M.D.   On: 05/29/2023 20:11   Personally reviewed  CT abdomen pelvis   ED Course:  Treated with cefepime, 2 L IV fluid, Tylenol , Zofran .   Assessment/Plan:  43 y.o. male with hx cerebral palsy with spastic quadriparesis, on baclofen  pump, neurogenic bowel and bladder, history hydrocephalus and shunt, seizure disorder, SVT, hypertension, who presented due to acute onset of vomiting and diarrhea. Admitted with sepsis secondary to gastroenteritis   Gastroenteritis  Sepsis secondary to above, present on admission, improved.  Acute onset of nausea/vomiting/diarrhea today.  On initial presentation febrile to 38.3, tachycardic in the 130s.  WBC 12.  Lactate 1.3. C diff neg, GI pathogen pending. UA negative for infection. Flu /COVID / RSV neg. CXR streaky atelectasis at bases. CT abdomen pelvis demonstrating esophageal wall thickening likely esophagitis, bladder wall thickening. Suspect possibly viral gastroenteritis, volume depletion contributing to his initial presentation. Less likely aspiration with note of basilar atelectasis on XR. Certainly at risk of aspiration considering his cerebral palsy and GI illness. Will continue coverage with antibiotics per below while awaiting GI pathogen and blood cultures. - Continue ciprofloxacin  for 100 mg IV every 12 hours, Flagyl 500 mg IV every 12 hours for now. - Follow-up blood cultures, GI pathogen panel, check RVP. Enteric precautions until GI path back.; then  continue contact with prior hx of a carbepenem intermediate pseudomonas species  - S/p 2 L IV fluid with improved tachycardia and lactate normal.  Continue oral hydration as able. - Zofran  3 times daily scheduled before meals - Aspiration precautions, elevate head of bed, one-to-one assist with feeds.  Borderline hypotension, hypovolemic / distributive  Hx hypertension, SVT  In setting of GI losses, infection per above - Slight reduction in diltiazem  to 60 mg twice daily, may need further reduction based on blood pressure trends.  Esophagitis Likely reflux esophagitis and may have component of gastroenteritis contributing as well. - Sub home PPI with pantoprazole  40 mg twice daily.  Increase sucralfate  to 4 times daily  Chronic medical problems: Cerebral palsy with spastic quadriparesis: on baclofen  pump,  neurogenic bowel and bladder: Required coud catheter insertion for urine sample in the emergency department.  Will need voiding trial soon. Impaired airway clearance related to neurodevelopmental disease: Hypertonic saline neb followed by albuterol neb 3 times daily, chest PT 3 times daily with bed.  History of recurrent urinary tract infections: On Keflex suppressive therapy, resume once off antibiotics above History hydrocephalus and shunt: Noted Seizure disorder: Continue home Vimpat  200 mg twice daily BPH: Continue home tamsulosin .  Noted to have moderate to marked prostatic enlargement and recommended for PSA which can be done as outpatient.  Moderate protein calorie malnutrition: Takes nutrition by mouth, is against feeding tube OSA: On CPAP   Body mass index is 16.83 kg/m.  ;   DVT prophylaxis:  Lovenox  Code Status:  Full Code Diet:  Diet Orders (From admission, onward)     Start     Ordered   05/30/23 0143  Diet regular Room service appropriate? Yes; Fluid consistency: Honey Thick  Diet effective now       Question Answer Comment  Room service appropriate? Yes   Fluid  consistency: Honey Thick      05/30/23 0201           Family Communication: Yes discussed with mother at bedside Consults: None Admission status:   Inpatient, Telemetry bed  Severity of Illness: The appropriate patient status for this patient is INPATIENT. Inpatient status is judged to be reasonable and necessary in order to provide the required intensity of service  to ensure the patient's safety. The patient's presenting symptoms, physical exam findings, and initial radiographic and laboratory data in the context of their chronic comorbidities is felt to place them at high risk for further clinical deterioration. Furthermore, it is not anticipated that the patient will be medically stable for discharge from the hospital within 2 midnights of admission.   * I certify that at the point of admission it is my clinical judgment that the patient will require inpatient hospital care spanning beyond 2 midnights from the point of admission due to high intensity of service, high risk for further deterioration and high frequency of surveillance required.*   Arnulfo Larch, MD [facility]  How to contact the [facility] Attending or Consulting provider 7A - 7P or covering provider during after hours 7P -7A, for this patient.  Check the care team in [facility] and look for a) attending/consulting [facility] provider listed and b) the [facility] team listed Log into [email or web address] and use [facility] universal password to access. If you do not have the password, please contact the hospital operator. Locate the [facility] provider you are looking for under [facility] and page to a number that you can be directly reached. If you still have difficulty reaching the provider, please page the [facility] (Director on Call) for the Hospitalists listed on amion for assistance.  05/30/2023, 6:26 AM

## 2023-05-30 NOTE — Progress Notes (Signed)
   05/30/23 2216  BiPAP/CPAP/SIPAP  BiPAP/CPAP/SIPAP Pt Type Adult  BiPAP/CPAP/SIPAP Resmed (patient's home machine)  Mask Type Full face mask  Dentures removed? Not applicable  Respiratory Rate 18 breaths/min  EPAP  (patient on home settings)  Flow Rate 2 lpm  Patient Home Machine Yes  Safety Check Completed by RT for Home Unit Yes, no issues noted  Patient Home Mask Yes  Patient Home Tubing Yes  Device Plugged into RED Power Outlet Yes  BiPAP/CPAP /SiPAP Vitals  Pulse Rate 99  Resp 18  SpO2 95 %  Bilateral Breath Sounds Clear;Diminished  MEWS Score/Color  MEWS Score 0  MEWS Score Color Marrie Sizer

## 2023-05-30 NOTE — Consult Note (Signed)
 Consultation Note Date: 05/30/2023   Patient Name: Joshua Logan  DOB: Oct 16, 1980  MRN: [id number]  Age / Sex: 43 y.o., male  PCP: Marykay Snipes Ky Phillips, MD Referring Physician: Bobbetta Burnet, MD  Reason for Consultation: Establishing goals of care  HPI/Patient Profile: 43 y.o. male  with past medical history of cerebral palsy with spastic quadriparesis, on baclofen  pump, neurogenic bowel and bladder, history hydrocephalus and shunt, seizure disorder, SVT, hypertension,  admitted on 05/29/2023 with gastroenteritis, sepsis secondary to above present on admission improved.   Clinical Assessment and Goals of Care: I have reviewed medical records including EPIC notes, labs and imaging, received report from RN, assessed the patient.  Mr. Bakken, Joshua, is lying quietly in bed.  He appears chronically ill.  He is known to be nonverbal with cerebral palsy.  His mother, Verlie Topper, is present at bedside along with Joshua second grade teacher.  We meet at the bedside to discuss diagnosis prognosis, GOC, EOL wishes, disposition and options.  I introduced Palliative Medicine as specialized medical care for people living with serious illness. It focuses on providing relief from the symptoms and stress of a serious illness. The goal is to improve quality of life for both the patient and the family.  We then focused on their current illness.  I shared that, fortunately, Mr. Niewinski does not have CDiff  We talked about the treatment plan in detail.  We review selected labs.  We talked about time for outcomes with ultimate goal of returning home the natural disease trajectory and expectations at EOL were discussed.  Advanced directives, concepts specific to code status, artifical feeding and hydration, and rehospitalization were considered and discussed. Mr. Villagran and his family are considering CODE STATUS.  [name] states that he would  not want intubation.  We talked about the concept of "treat the treatable, but allow a natural passing".    Palliative Care services outpatient were explained and offered.  We talked about the benefits of outpatient palliative services for further tips and tricks, goals of care discussion.  We talked about how to put the services in place if they so desire.  Discussed the importance of continued conversation with family and the medical providers regarding overall plan of care and treatment options, ensuring decisions are within the context of the patient's values and GOCs.  Questions and concerns were addressed.  Hard Choices booklet left for review. The family was encouraged to call with questions or concerns.  PMT will continue to support holistically.  Conference with attending, bedside nursing staff, transitional care team related to patient condition, needs, goals of care, disposition.   HCPOA  HCPOA -[name] states that she and Jsean's father, Cornel Diesel, are healthcare surrogates.    SUMMARY OF RECOMMENDATIONS   Continue full scope/full code, DNI Time for outcomes Return home when appropriate Considering CODE STATUS Considering outpatient palliative   Code Status/Advance Care Planning: Limited code -Mr. Zavaleta and his family are considering CODE STATUS.  [name] states that he would not want intubation.  We talked about the concept of "treat the treatable, but allow a natural passing".    Symptom Management:  Per hospitalist, no additional needs at this time.  Palliative Prophylaxis:  Frequent Pain Assessment, Oral Care, and Turn Reposition  Additional Recommendations (Limitations, Scope, Preferences): Full Scope Treatment and DNI  Psycho-social/Spiritual:  Desire for further Chaplaincy support:no Additional Recommendations: Caregiving  Support/Resources  Prognosis:  Unable to determine, based on outcomes.  1 year or more would not be surprising based on age.  Discharge Planning:  home, no services at this time.       Primary Diagnoses: Present on Admission:  Gastroenteritis  Sepsis (HCC)   I have reviewed the medical record, interviewed the patient and family, and examined the patient. The following aspects are pertinent.  Past Medical History:  Diagnosis Date   Cerebral palsy (HCC)    with spastic quadriparesis with numerous orthopedic procedures for contractures and fratures   Chronic constipation    Chronic pulmonary aspiration    CSA (central sleep apnea)    Dysrhythmia    PSVT   GERD (gastroesophageal reflux disease)    History of kidney stones    Hydrocephalus (HCC)    s/p VP shunt   Hypoxemia    chronic   Iron deficiency anemia    OSA (obstructive sleep apnea)    PONV (postoperative nausea and vomiting)    once time after having baclofen  pump placed   Protein calorie malnutrition (HCC)    chronic   Seizures (HCC)    Tachycardia    chronic   Social History   Socioeconomic History   Marital status: Single    Spouse name: Not on file   Number of children: Not on file   Years of education: Not on file   Highest education level: Not on file  Occupational History   Not on file  Tobacco Use   Smoking status: Never    Passive exposure: Never   Smokeless tobacco: Never  Vaping Use   Vaping status: Never Used  Substance and Sexual Activity   Alcohol use: No    Alcohol/week: 0.0 standard drinks of alcohol   Drug use: No   Sexual activity: Not on file  Other Topics Concern   Not on file  Social History Narrative   Lives with mother and father in [place] area   [employer] graduate   Patient is nonverbal and uses Dyn[employer] speech communication device for communication   Social Drivers of Health   Financial Resource Strain: Low Risk  (12/07/2022)   Received from [facility]   Overall Financial Resource Strain (CARDIA)    Difficulty of Paying Living Expenses: Not hard at all  Food Insecurity: No Food Insecurity  (12/07/2022)   Received from [facility]   Hunger Vital Sign    Worried About Running Out of Food in the Last Year: Never true    Ran Out of Food in the Last Year: Never true  Transportation Needs: No Transportation Needs (12/07/2022)   Received [facility]    In the past 12 months, has lack of transportation kept you from medical appointments or from getting medications?: No    Lack of Transportation (Non-Medical): No  Physical Activity: Not on file  Stress: Not on file  Social Connections: Not on file   Family History  Problem Relation Age of Onset   Osteoporosis Mother    Osteoarthritis Mother  in her back   Colon cancer Father    Hypertension Father    Scheduled Meds:  albuterol  2.5 mg Nebulization Q8H   diltiazem   60 mg Oral BID   enoxaparin  (LOVENOX ) injection  40 mg Subcutaneous Q24H   lacosamide   200 mg Oral BID   lactobacillus  1 g Oral TID WC   linaclotide   145 mcg Oral QAC breakfast   ondansetron  (ZOFRAN ) IV  4 mg Intravenous TID AC   pantoprazole   40 mg Oral BID   sodium chloride  flush  3 mL Intravenous Q12H   sodium chloride  HYPERTONIC  4 mL Nebulization Q8H   sucralfate   1 g Oral TID WC & HS   tamsulosin   0.4 mg Oral Daily   Continuous Infusions:  baclofen      ciprofloxacin  400 mg (05/30/23 0910)   lactated ringers  150 mL/hr at 05/30/23 0719   metronidazole Stopped (05/30/23 0326)   PRN Meds:. Medications Prior to Admission:  Prior to Admission medications   Medication Sig Start Date End Date Taking? Authorizing Provider  BACLOFEN  IT 1,449 mcg by Intrathecal route continuous.   Yes [provider]  diltiazem  (CARDIZEM  SR) 90 MG 12 hr capsule TAKE 1 CAPSULE(90 MG) BY MOUTH TWICE DAILY Patient taking differently: Take 90 mg by mouth 2 (two) times daily. 04/12/23  Yes BranchJoyceann No, MD  ketoconazole (NIZORAL) 2 % cream Apply 1 Application topically daily as needed for  irritation.   Yes [provider]  ketoconazole (NIZORAL) 2 % shampoo Apply 1 application  topically every other day. 11/20/12  Yes [provider]  lacosamide  (VIMPAT ) 200 MG TABS tablet Take 200 mg by mouth 2 (two) times daily.   Yes [provider]  lidocaine -prilocaine (EMLA) cream Apply 1 Application topically as needed (pain). 12/01/20  Yes [provider]  LINZESS  145 MCG CAPS Take 145 mcg by mouth daily.  09/06/11  Yes [provider]  nitrofurantoin (MACRODANTIN) 100 MG capsule Take 100 mg by mouth daily. 90 day cycle 08/25/21  Yes [provider]  omeprazole (PRILOSEC) 40 MG capsule Take 40 mg by mouth 2 (two) times daily. 09/02/14  Yes [provider]  polyethylene glycol (MIRALAX  / GLYCOLAX ) packet Take 8.5 g by mouth daily.   Yes [provider]  RESTASIS  0.05 % ophthalmic emulsion Place 1 drop into both eyes 2 (two) times daily. 08/31/20  Yes [provider]  sodium chloride  HYPERTONIC 3 % nebulizer solution Take 4 mLs by nebulization 3 (three) times daily.   Yes [provider]  sucralfate  (CARAFATE ) 1 GM/10ML suspension Take 500 mg by mouth 2 (two) times daily.   Yes [provider]  tamsulosin  (FLOMAX ) 0.4 MG CAPS capsule Take 0.4 mg by mouth daily. 12/17/12  Yes [provider]  tretinoin (RETIN-A) 0.025 % cream Apply 1 Application topically at bedtime as needed (irritation). Apply a a thin layer to affected areas nightly 20 min after washing. Start with two nights weekly and increase as tolerated. 05/30/13  Yes [provider]  Urea 40 % LOTN Apply 1 Application topically 2 (two) times daily as needed (KERTOSIS PILARIS ON ARMS). APPLY 1-2 TIMES DAILY TO KERTOSIS PILARIS ON ARMS 01/15/14  Yes [provider]  cephALEXin (KEFLEX) 250 MG capsule Take 250 mg by mouth See admin instructions. 90 day cycle 12/15/15   [provider]   Allergies  Allergen  Reactions   Augmentin [Amoxicillin-Pot Clavulanate] Rash and Other (See Comments)    Blisters. Pt can  take Amoxicillin, but NOT Augmentin    Bactrim [Sulfamethoxazole-Trimethoprim] Rash   Doxycycline Other (See Comments)    Bad reflux- long term use    Versed  [Midazolam ]     Eyes stayed dilated    Whole Blood     Pushing too fast "Red Man Syndrome"   Cefazolin  Rash   Midazolam  Hcl Other (See Comments)    Un-reactive pupils after surgery   Septra [Bactrim] Hives   Vancomycin Rash   Review of Systems  Unable to perform ROS: Patient nonverbal    Physical Exam Vitals and nursing note reviewed.  Constitutional:      General: He is not in acute distress.    Appearance: He is ill-appearing.  Skin:    General: Skin is warm and dry.  Neurological:     Mental Status: He is alert.     Vital Signs: BP 123/75   Pulse 100   Temp 98 F (36.7 C) (Oral)   Resp 12   Ht 5\' 9"  (1.753 m)   Wt 51.7 kg   SpO2 96%   BMI 16.83 kg/m  Pain Scale: 0-10   Pain Score: 0-No pain   SpO2: SpO2: 96 % O2 Device:SpO2: 96 % O2 Flow Rate: .O2 Flow Rate (L/min): 2 L/min  IO: Intake/output summary:  Intake/Output Summary (Last 24 hours) at 05/30/2023 0935 Last data filed at 05/30/2023 0456 Gross per 24 hour  Intake 2200 ml  Output 900 ml  Net 1300 ml    LBM: Last BM Date : 05/30/23 Baseline Weight: Weight: 51.7 kg Most recent weight: Weight: 51.7 kg     Palliative Assessment/Data:     Time In: 0830  Time Out: 0945 Time Total: 75 minutes  Greater than 50%  of this time was spent counseling and coordinating care related to the above assessment and plan.  Signed by: Annabelle Barrack, NP   Please contact Palliative Medicine Team phone at [phone] for questions and concerns.  For individual provider: See Tilford Foley

## 2023-05-30 NOTE — Sepsis Progress Note (Signed)
 Elink monitoring for the code sepsis protocol.

## 2023-05-30 NOTE — Hospital Course (Addendum)
 Joshua Logan is a 43 y.o. male with hx cerebral palsy with spastic quadriparesis, on baclofen  pump, neurogenic bowel and bladder, history hydrocephalus and shunt, seizure disorder, SVT, hypertension, who presented due to acute onset of vomiting and diarrhea. Admitted with sepsis secondary to gastroenteritis.     Assessment & Plan:   Principal Problem:   Gastroenteritis Active Problems:   Sepsis (HCC)   Esophagitis    Gastroenteritis with Sepsis  - Met sepsis criteria on admission likely due to gastroenteritis  POA: Tmax 100.9, HR 130, WBC 12, lactic acid 1.3, 88% on room air - C. difficile negative, GI pathogen pending - UA negative for infection. Flu /COVID / RSV neg.  - CXR streaky atelectasis at bases.  - CT abdomen pelvis demonstrating esophageal wall thickening likely esophagitis, bladder wall thickening. Suspect possibly viral gastroenteritis,  Less likely aspiration with note of basilar atelectasis on XR.  - High at risk of aspiration considering his cerebral palsy and GI illness. - Continue current empiric antibiotics, pending GI pathogen, cultures  - Continue ciprofloxacin  for 100 mg IV every 12 hours, Flagyl 500 mg IV every 12 hours for now. - Follow-up blood cultures, GI pathogen panel, check RVP.  -Enteric precautions until GI path back.; then continue contact with prior hx of a carbepenem intermediate pseudomonas species   - S/p 2 L IV fluid with improved tachycardia and lactate normal.   - Encouraging oral hydration, - Zofran  3 times daily scheduled before meals - Aspiration precautions, elevate head of bed, one-to-one assist with feeds.   Borderline hypotension, hypovolemic / distributive  Hx hypertension, SVT  In setting of GI losses, infection per above - Slight reduction in diltiazem  to 60 mg twice daily, may need further reduction based on blood pressure trends.   Esophagitis Likely reflux esophagitis and may have component of gastroenteritis contributing  as well. -   Increase sucralfate  to 4 times daily - Holding PPI to reduce risk of C. difficile development   Chronic medical problems:  Cerebral palsy with spastic quadriparesis: on baclofen  pump,   Neurogenic bowel and bladder: Required coud catheter insertion for urine sample in the emergency department.  Will need voiding trial soon.  Impaired airway clearance related to neurodevelopmental disease: Hypertonic saline neb followed by albuterol neb 3 times daily, chest PT 3 times daily with bed.   History of recurrent urinary tract infections:  On Keflex suppressive therapy, resume once off antibiotics above  History hydrocephalus and shunt: Noted  Seizure disorder: Continue home Vimpat  200 mg twice daily  BPH: Continue home tamsulosin .  Noted to have moderate to marked prostatic enlargement and recommended for PSA which can be done as outpatient.   Moderate protein calorie malnutrition: Takes nutrition by mouth, is against feeding tube  OSA: On CPAP    Body mass index is 16.83 kg/m.

## 2023-05-30 NOTE — Plan of Care (Signed)

## 2023-05-30 NOTE — Progress Notes (Signed)
 Transition of Care Department [facility]) has reviewed patient and no other TOC needs have been identified at this time. We will continue to monitor patient advancement through interdisciplinary progression rounds. If new patient transition needs arise, please place a TOC consult.   05/30/23 0813  TOC Brief Assessment  Insurance and Status Reviewed  Patient has primary care physician Yes  Home environment has been reviewed Lives with parents.  Prior level of function: Parents assist.  Prior/Current Home Services No current home services  Social Drivers of Health Review SDOH reviewed no interventions necessary  Readmission risk has been reviewed Yes  Transition of care needs no transition of care needs at this time

## 2023-05-30 NOTE — Progress Notes (Signed)
 PROGRESS NOTE    Patient: Joshua Logan                            PCP: Orest Bio, MD                    DOB: [birth year]            DOA: 05/29/2023 [id number]             DOS: 05/30/2023, 11:59 AM   LOS: 0 days   Date of Service: The patient was seen and examined on 05/30/2023  Subjective:   The patient was seen and examined this morning. Hemodynamically stable. Awake-nonverbal in no acute distress  Mother present at bedside providing detail information  Brief Narrative:   Joshua C Borghi is a 43 y.o. male with hx cerebral palsy with spastic quadriparesis, on baclofen  pump, neurogenic bowel and bladder, history hydrocephalus and shunt, seizure disorder, SVT, hypertension, who presented due to acute onset of vomiting and diarrhea. Admitted with sepsis secondary to gastroenteritis.     Assessment & Plan:   Principal Problem:   Gastroenteritis Active Problems:   Sepsis (HCC)   Esophagitis    Gastroenteritis with Sepsis  - Met sepsis criteria on admission likely due to gastroenteritis  POA: Tmax 100.9, HR 130, WBC 12, lactic acid 1.3, 88% on room air - C. difficile negative, GI pathogen pending - UA negative for infection. Flu /COVID / RSV neg.  - CXR streaky atelectasis at bases.  - CT abdomen pelvis demonstrating esophageal wall thickening likely esophagitis, bladder wall thickening. Suspect possibly viral gastroenteritis,  Less likely aspiration with note of basilar atelectasis on XR.  - High at risk of aspiration considering his cerebral palsy and GI illness. - Continue current empiric antibiotics, pending GI pathogen, cultures  - Continue ciprofloxacin  for 100 mg IV every 12 hours, Flagyl 500 mg IV every 12 hours for now. - Follow-up blood cultures, GI pathogen panel, check RVP.  -Enteric precautions until GI path back.; then continue contact with prior hx of a carbepenem intermediate pseudomonas species   - S/p 2 L IV fluid with improved tachycardia and  lactate normal.   - Encouraging oral hydration, - Zofran  3 times daily scheduled before meals - Aspiration precautions, elevate head of bed, one-to-one assist with feeds.   Borderline hypotension, hypovolemic / distributive  Hx hypertension, SVT  In setting of GI losses, infection per above - Slight reduction in diltiazem  to 60 mg twice daily, may need further reduction based on blood pressure trends.   Esophagitis Likely reflux esophagitis and may have component of gastroenteritis contributing as well. -   Increase sucralfate  to 4 times daily - Holding PPI to reduce risk of C. difficile development   Chronic medical problems:  Cerebral palsy with spastic quadriparesis: on baclofen  pump,   Neurogenic bowel and bladder: Required coud catheter insertion for urine sample in the emergency department.  Will need voiding trial soon.  Impaired airway clearance related to neurodevelopmental disease: Hypertonic saline neb followed by albuterol neb 3 times daily, chest PT 3 times daily with bed.   History of recurrent urinary tract infections:  On Keflex suppressive therapy, resume once off antibiotics above  History hydrocephalus and shunt: Noted  Seizure disorder: Continue home Vimpat  200 mg twice daily  BPH: Continue home tamsulosin .  Noted to have moderate to marked prostatic enlargement and recommended for PSA which can be done as outpatient.  Moderate protein calorie malnutrition: Takes nutrition by mouth, is against feeding tube  OSA: On CPAP    Body mass index is 16.83 kg/m.      ------------------------------------------------------------------------------------------------------------ Nutritional status:  The patient's BMI is: Body mass index is 18.13 kg/m. I agree with the assessment and plan as outlined  Skin Assessment: I have examined the patient's skin and I agree with the wound assessment as performed by wound care team    ------------------------------------------------------------------------------------------------------------ Cultures; Stool for C. difficile negative Pending stool cytology   ------------------------------------------------------------------------------------------------------------  DVT prophylaxis:  enoxaparin  (LOVENOX ) injection 40 mg Start: 05/30/23 1000   Code Status:   Code Status: Full Code  Family Communication: Mother present at bedside discussed current findings, treatment plan-goals of care -Advance care planning has been discussed.   Admission status:   Status is: Inpatient Remains inpatient appropriate because: IV fluids IV antibiotics   Disposition: From  - home             Planning for discharge in 1-2 days: to   Procedures:   No admission procedures for hospital encounter.   Antimicrobials:  Anti-infectives (From admission, onward)    Start     Dose/Rate Route Frequency Ordered Stop   05/30/23 0800  ciprofloxacin  (CIPRO ) IVPB 400 mg        400 mg 200 mL/hr over 60 Minutes Intravenous Every 12 hours 05/30/23 0139     05/30/23 0200  metroNIDAZOLE (FLAGYL) IVPB 500 mg        500 mg 100 mL/hr over 60 Minutes Intravenous Every 12 hours 05/30/23 0139     05/30/23 0030  ceFEPIme (MAXIPIME) 2 g in sodium chloride  0.9 % 100 mL IVPB        2 g 200 mL/hr over 30 Minutes Intravenous  Once 05/30/23 0016 05/30/23 0113        Medication:   albuterol  2.5 mg Nebulization Q8H   Chlorhexidine  Gluconate Cloth  6 each Topical Q0600   diltiazem   60 mg Oral BID   enoxaparin  (LOVENOX ) injection  40 mg Subcutaneous Q24H   lacosamide   200 mg Oral BID   lactobacillus  1 g Oral TID WC   linaclotide   145 mcg Oral QAC breakfast   ondansetron  (ZOFRAN ) IV  4 mg Intravenous TID AC   pantoprazole   40 mg Oral BID   sodium chloride  flush  3 mL Intravenous Q12H   sodium chloride  HYPERTONIC  4 mL Nebulization Q8H   sucralfate   1 g Oral TID WC & HS   tamsulosin   0.4 mg Oral  Daily       Objective:   Vitals:   05/30/23 0600 05/30/23 0629 05/30/23 0715 05/30/23 1157  BP: 96/64  123/75 124/73  Pulse: 80  100 (!) 103  Resp: 10  12   Temp:   98 F (36.7 C) 98.5 F (36.9 C)  TempSrc:   Oral Axillary  SpO2: 97% 95% 96% 96%  Weight:    55.7 kg  Height:    5\' 9"  (1.753 m)    Intake/Output Summary (Last 24 hours) at 05/30/2023 1159 Last data filed at 05/30/2023 0456 Gross per 24 hour  Intake 2200 ml  Output 900 ml  Net 1300 ml   Filed Weights   05/29/23 1843 05/30/23 1157  Weight: 51.7 kg 55.7 kg     Physical examination:    General:  Palsy patient, bedbound, awake, following commands  HEENT:  Normocephalic, PERRL, otherwise with in Normal limits   Neuro:  CNII-XII intact. ,  normal motor and sensation, reflexes intact   Lungs:   Clear to auscultation BL, Respirations unlabored,  No wheezes / crackles  Cardio:    S1/S2, RRR, No murmure, No Rubs or Gallops   Abdomen:  Soft, non-tender, bowel sounds active all four quadrants, no guarding or peritoneal signs.  Muscular  skeletal:  Paraplegic  Limited exam -global generalized weaknesses Cachectic, atrophic bilateral lower extremities, no motor response 2+ pulses,  symmetric, No pitting edema  Skin:  Dry, warm to touch, negative for any Rashes,  Wounds: Please see nursing documentation            LABs:     Latest Ref Rng & Units 05/30/2023    6:48 AM 05/29/2023    7:19 PM 12/11/2020    4:08 AM  CBC  WBC 4.0 - 10.5 K/uL 8.7  12.6  7.1   Hemoglobin 13.0 - 17.0 g/dL 30.8  65.7  84.6   Hematocrit 39.0 - 52.0 % 39.0  52.1  44.5   Platelets 150 - 400 K/uL 216  248  207       Latest Ref Rng & Units 05/30/2023    5:46 AM 05/29/2023    7:19 PM 12/11/2020    4:08 AM  CMP  Glucose 70 - 99 mg/dL 82  962  952   BUN 6 - 20 mg/dL 16  21  22    Creatinine 0.61 - 1.24 mg/dL 8.41  3.24  4.01   Sodium 135 - 145 mmol/L 140  139  139   Potassium 3.5 - 5.1 mmol/L 4.2  4.2  3.9   Chloride 98 - 111  mmol/L 108  103  99   CO2 22 - 32 mmol/L 26  25  27    Calcium 8.9 - 10.3 mg/dL 8.5  9.7  8.6   Total Protein 6.5 - 8.1 g/dL  8.6    Total Bilirubin 0.0 - 1.2 mg/dL  0.4    Alkaline Phos 38 - 126 U/L  135    AST 15 - 41 U/L  17    ALT 0 - 44 U/L  11         Micro Results Recent Results (from the past 240 hours)  Resp panel by RT-PCR (RSV, Flu A&B, Covid) Anterior Nasal Swab     Status: None   Collection Time: 05/29/23  6:45 PM   Specimen: Anterior Nasal Swab  Result Value Ref Range Status   SARS Coronavirus 2 by RT PCR NEGATIVE NEGATIVE Final    Comment: (NOTE) SARS-CoV-2 target nucleic acids are NOT DETECTED.  The SARS-CoV-2 RNA is generally detectable in upper respiratory specimens during the acute phase of infection. The lowest concentration of SARS-CoV-2 viral copies this assay can detect is 138 copies/mL. A negative result does not preclude SARS-Cov-2 infection and should not be used as the sole basis for treatment or other patient management decisions. A negative result may occur with  improper specimen collection/handling, submission of specimen other than nasopharyngeal swab, presence of viral mutation(s) within the areas targeted by this assay, and inadequate number of viral copies(<138 copies/mL). A negative result must be combined with clinical observations, patient history, and epidemiological information. The expected result is Negative.  Fact Sheet for Patients:  BloggerCourse.com  Fact Sheet for Healthcare Providers:  SeriousBroker.it  This test is no t yet approved or cleared by the [place]  FDA and  has been authorized for detection and/or diagnosis of SARS-CoV-2 by FDA under an Emergency Use Authorization (EUA). This  EUA will remain  in effect (meaning this test can be used) for the duration of the COVID-19 declaration under Section 564(b)(1) of the Act, 21 U.S.C.section 360bbb-3(b)(1), unless the  authorization is terminated  or revoked sooner.       Influenza A by PCR NEGATIVE NEGATIVE Final   Influenza B by PCR NEGATIVE NEGATIVE Final    Comment: (NOTE) The Xpert Xpress SARS-CoV-2/FLU/RSV plus assay is intended as an aid in the diagnosis of influenza from Nasopharyngeal swab specimens and should not be used as a sole basis for treatment. Nasal washings and aspirates are unacceptable for Xpert Xpress SARS-CoV-2/FLU/RSV testing.  Fact Sheet for Patients: BloggerCourse.com  Fact Sheet for Healthcare Providers: SeriousBroker.it  This test is not yet approved or cleared by the [place]  FDA and has been authorized for detection and/or diagnosis of SARS-CoV-2 by FDA under an Emergency Use Authorization (EUA). This EUA will remain in effect (meaning this test can be used) for the duration of the COVID-19 declaration under Section 564(b)(1) of the Act, 21 U.S.C. section 360bbb-3(b)(1), unless the authorization is terminated or revoked.     Resp Syncytial Virus by PCR NEGATIVE NEGATIVE Final    Comment: (NOTE) Fact Sheet for Patients: BloggerCourse.com  Fact Sheet for Healthcare Providers: SeriousBroker.it  This test is not yet approved or cleared by the [place]  FDA and has been authorized for detection and/or diagnosis of SARS-CoV-2 by FDA under an Emergency Use Authorization (EUA). This EUA will remain in effect (meaning this test can be used) for the duration of the COVID-19 declaration under Section 564(b)(1) of the Act, 21 U.S.C. section 360bbb-3(b)(1), unless the authorization is terminated or revoked.  Performed at [facility], [address], [place], [place] [address]   Blood culture (routine x 2)     Status: None (Preliminary result)   Collection Time: 05/30/23 12:01 AM   Specimen: BLOOD  Result Value Ref Range Status   Specimen Description BLOOD  BLOOD RIGHT HAND  Final   Special Requests   Final    BOTTLES DRAWN AEROBIC AND ANAEROBIC Blood Culture results may not be optimal due to an inadequate volume of blood received in culture bottles   Culture   Final    NO GROWTH < 12 HOURS Performed at [facility], [address] [place], [place] [address]    Report Status PENDING  Incomplete  Blood culture (routine x 2)     Status: None (Preliminary result)   Collection Time: 05/30/23 12:01 AM   Specimen: BLOOD  Result Value Ref Range Status   Specimen Description BLOOD BLOOD LEFT HAND  Final   Special Requests   Final    BOTTLES DRAWN AEROBIC AND ANAEROBIC Blood Culture adequate volume   Culture   Final    NO GROWTH < 12 HOURS Performed at [facility], [address], [place], [place] [address]    Report Status PENDING  Incomplete  C Difficile Quick Screen w PCR reflex     Status: None   Collection Time: 05/30/23 12:37 AM   Specimen: Urine, Catheterized; Stool  Result Value Ref Range Status   C Diff antigen NEGATIVE NEGATIVE Final   C Diff toxin NEGATIVE NEGATIVE Final   C Diff interpretation No C. difficile detected.  Final    Comment: Performed at [facility], [address], [place], [place] [address]    Radiology Reports CT ABDOMEN PELVIS W CONTRAST Result Date: 05/29/2023 CLINICAL DATA:  Vomiting. EXAM: CT ABDOMEN AND PELVIS WITH CONTRAST TECHNIQUE: Multidetector  CT imaging of the abdomen and pelvis was performed using the standard protocol following bolus administration of intravenous contrast. RADIATION DOSE REDUCTION: This exam was performed according to the departmental dose-optimization program which includes automated exposure control, adjustment of the mA and/or kV according to patient size and/or use of iterative reconstruction technique. CONTRAST:  80mL OMNIPAQUE IOHEXOL 300 MG/ML  SOLN COMPARISON:  December 06, 2020 FINDINGS: Lower chest: Mild atelectatic changes are seen within the bilateral lung bases. Hepatobiliary:  A 10 mm cyst versus hemangioma (approximately 36.08 Hounsfield units) is seen within the anterior aspect of the right lobe of the liver. No gallstones, gallbladder wall thickening, or biliary dilatation. Pancreas: Unremarkable. No pancreatic ductal dilatation or surrounding inflammatory changes. Spleen: Normal in size without focal abnormality. Adrenals/Urinary Tract: Adrenal glands are unremarkable. The left kidney is surgically absent. The right kidney is normal in size, without renal calculi or hydronephrosis. Subcentimeter simple cysts are seen within the right kidney. Mild to moderate severity diffuse urinary bladder wall thickening is noted. Stomach/Bowel: There is mild diffuse thickening of the distal esophagus. Stomach is within normal limits. The appendix is not clearly identified. Stool is seen throughout the large bowel. No evidence of bowel wall thickening, distention, or inflammatory changes. Vascular/Lymphatic: No significant vascular findings are present. No enlarged abdominal or pelvic lymph nodes. Reproductive: There is moderate to marked severity prostate gland enlargement. Other: There is a small, stable fat containing umbilical hernia. Abdominopelvic ascites. Musculoskeletal: Extensive postoperative changes are seen throughout the thoracolumbar spine, with additional postoperative changes seen involving the proximal right femur. Congenital deformities are seen involving both hips. No acute osseous abnormalities are identified. IMPRESSION: 1. Mild diffuse thickening of the distal esophagus which may represent sequelae associated with esophagitis. 2. Mild to moderate severity diffuse urinary bladder wall thickening which may be secondary to chronic bladder outlet obstruction. Correlation with urinalysis is recommended to exclude the presence of cystitis. 3. Moderate to marked severity prostate gland enlargement. Correlation with PSA levels is recommended. 4. Small, stable fat containing umbilical  hernia. 5. Extensive postoperative changes throughout the thoracolumbar spine and proximal right femur. 6. Congenital deformities involving both hips. Electronically Signed   By: Virgle Grime M.D.   On: 05/29/2023 22:39   DG Chest 2 View Result Date: 05/29/2023 CLINICAL DATA:  Shortness of breath EXAM: CHEST - 2 VIEW COMPARISON:  12/11/2020 FINDINGS: Posterior spinal rods are again noted. No significant pleural effusion. Streaky atelectasis at the bases. Stable cardiomediastinal silhouette. No pneumothorax. Chronic catheter or tubing at the cavoatrial junction with additional tubing or catheter visualized over the neck and left paraspinal region. IMPRESSION: Streaky atelectasis at the bases. Electronically Signed   By: Esmeralda Hedge M.D.   On: 05/29/2023 20:11    SIGNED: Bobbetta Burnet, MD, FHM. FAAFP. Arlin Benes - [facility] Time spent - 55 min.  In seeing, evaluating and examining the patient. Reviewing medical records, labs, drawn plan of care. [facility],  Pager (please use [email or web address] to page/ text) Please use Epic Secure Chat for non-urgent communication (7AM-7PM)  If 7PM-7AM, please contact night-coverage [email or web address], 05/30/2023, 11:59 AM

## 2023-05-31 LAB — CBC
HCT: 35.1 % — ABNORMAL LOW (ref 39.0–52.0)
Hemoglobin: 11.5 g/dL — ABNORMAL LOW (ref 13.0–17.0)
MCH: 29.9 pg (ref 26.0–34.0)
MCHC: 32.8 g/dL (ref 30.0–36.0)
MCV: 91.4 fL (ref 80.0–100.0)
Platelets: 180 10*3/uL (ref 150–400)
RBC: 3.84 MIL/uL — ABNORMAL LOW (ref 4.22–5.81)
RDW: 14.6 % (ref 11.5–15.5)
WBC: 6.3 10*3/uL (ref 4.0–10.5)
nRBC: 0 % (ref 0.0–0.2)

## 2023-05-31 LAB — GASTROINTESTINAL PANEL BY PCR, STOOL (REPLACES STOOL CULTURE)

## 2023-05-31 LAB — BASIC METABOLIC PANEL WITH GFR
Anion gap: 7 (ref 5–15)
BUN: 8 mg/dL (ref 6–20)
CO2: 29 mmol/L (ref 22–32)
Calcium: 9.1 mg/dL (ref 8.9–10.3)
Chloride: 106 mmol/L (ref 98–111)
Creatinine, Ser: 0.69 mg/dL (ref 0.61–1.24)
GFR, Estimated: >60 mL/min (ref >60)
Glucose, Bld: 96 mg/dL (ref 70–99)
Potassium: 4.1 mmol/L (ref 3.5–5.1)
Sodium: 142 mmol/L (ref 135–145)

## 2023-05-31 MED ORDER — ONDANSETRON HCL 4 MG PO TABS: 4.0000 mg | ORAL_TABLET | Freq: Every day | ORAL | 1 refills | Status: AC | PRN

## 2023-05-31 MED ORDER — FLORANEX PO PACK: 1.0000 g | PACK | Freq: Three times a day (TID) | ORAL | 0 refills | Status: AC

## 2023-05-31 NOTE — Plan of Care (Signed)

## 2023-05-31 NOTE — Discharge Summary (Signed)
 Joshua Logan, is a 43 y.o. male  DOB [birth year]  MRN [id number].  Admission date:  05/29/2023  Admitting Physician  Arnulfo Larch, MD  Discharge Date:  05/31/2023   Primary MD  Marykay Snipes, Ky Phillips, MD  Recommendations for primary care physician for things to follow:   please maintain adequate hydration----avoid dehydration, use Zofran  as needed for nausea and vomiting  Admission Diagnosis  Gastroenteritis [K52.9] Vomiting and diarrhea [R11.10, R19.7] Sepsis without acute organ dysfunction, due to unspecified organism [facility]) [A41.9]   Discharge Diagnosis  Gastroenteritis [K52.9] Vomiting and diarrhea [R11.10, R19.7] Sepsis without acute organ dysfunction, due to unspecified organism [facility]) [A41.9]   Principal Problem:   Gastroenteritis Active Problems:   Sepsis (HCC)   Esophagitis      Past Medical History:  Diagnosis Date   Cerebral palsy (HCC)    with spastic quadriparesis with numerous orthopedic procedures for contractures and fratures   Chronic constipation    Chronic pulmonary aspiration    CSA (central sleep apnea)    Dysrhythmia    PSVT   GERD (gastroesophageal reflux disease)    History of kidney stones    Hydrocephalus (HCC)    s/p VP shunt   Hypoxemia    chronic   Iron deficiency anemia    OSA (obstructive sleep apnea)    PONV (postoperative nausea and vomiting)    once time after having baclofen  pump placed   Protein calorie malnutrition (HCC)    chronic   Seizures (HCC)    Tachycardia    chronic    Past Surgical History:  Procedure Laterality Date   adductor lengthening Bilateral    10/1985   BACK SURGERY     [place] RODS PLACED FOR SCOLIOSIS   BACLOFEN  PUMP PLACEMENT     BRONCHOSCOPY     05/2010- [place]   CSF SHUNT     CYSTOSCOPY     exploratory after UTI and ureter trauma- 09/2004   EYE SURGERY     MULTIPLE   HIP SURGERY     MULTIPLE; metal plate on right    INGUINAL HERNIA REPAIR     BILATERAL   IRRIGATION AND DEBRIDEMENT SEBACEOUS CYST N/A 09/12/2022   Procedure: EXCISIONOF BACK SEBACEOUS CYST;  Surgeon: Enid Harry, MD;  Location: [facility] OR;  Service: General;  Laterality: N/A;   NEPHRECTOMY Left    [facility]   PATENT DUCTUS ARTERIOUS REPAIR     11/1981   pelvic hardware removal     07/1996   rectis recessions Bilateral    11/1989   removal hip plate     40/9811   RHIZOTOMY     bilateral L5 and S1 radiofrequency rhizotomy- 07/2001   right hip varus derotational osteotomy     05/1990   TOOTH EXTRACTION     2 teeth0 04/1994   VENTRICULAR ATRIAL SHUNT     VENTRICULOPERITONEAL SHUNT     12/1981   WISDOM TOOTH EXTRACTION     06/1996     HPI  from the history and physical done  on the day of admission:    HPI: History limited due to patient's nonverbal status, provided by mother at bedside. Joshua Logan is a 43 y.o. male with hx of cerebral palsy with spastic quadriparesis, on baclofen  pump, neurogenic bowel and bladder, history hydrocephalus and shunt, seizure disorder, SVT, hypertension, who presented due to acute onset of vomiting and diarrhea.  Mother reports that he had been in normal state of health until a few days ago noted that he was having some sinus drainage and also having eructation and thought he may be having reflux.  This morning he suddenly developed vomiting with large watery emesis and into the afternoon developing copious watery diarrhea.  No fevers, chills, cough, abdominal pain, black/bloody stools, urinary changes, rashes.     Review of Systems:  ROS complete and negative except as marked above     Hospital Course:     1)Gastroenteritis -POA - Met SIRs criteria on admission likely due to gastroenteritis with dehydration  POA: Tmax 100.9, HR 130, WBC 12, lactic acid 1.3, 88% on room air - C. difficile negative, GI pathogen negative - UA negative for infection. Flu /COVID / RSV neg.  - CXR streaky  atelectasis at bases.  - CT abdomen pelvis demonstrating esophageal wall thickening likely esophagitis, bladder wall thickening.  -Treated with empiric antibiotics including ciprofloxacin  and  Flagyl  500 mg  - Blood cultures NGTD -No evidence of ongoing significant infection at this time -No further antibiotics indicated -Sepsis ruled out , patient did meet SIRS criteria on admission    2)Borderline hypotension, hypovolemic / distributive  Hx hypertension, SVT  In setting of GI losses  - Improved after hydration   Esophagitis Likely reflux esophagitis and may have component of gastroenteritis contributing as well. -Continue PTA PPI and Carafate  -Avoid NSAIDs   Chronic medical problems:   Cerebral palsy with spastic quadriparesis: on baclofen  pump,    Neurogenic bowel and bladder: Required coud catheter insertion for urine sample in the emergency department.  -Foley removed and patient passed voiding trial prior to discharge home   History of recurrent urinary tract infections:  On alternating Macrobid and Keflex suppressive therapy per his urologist  History hydrocephalus and shunt: Noted   Seizure disorder: Continue home Vimpat  200 mg twice daily  BPH: Continue home tamsulosin .  Noted to have moderate to marked prostatic enlargement and recommended for PSA as outpatient when he sees his urologist   Moderate protein calorie malnutrition: Takes nutrition by mouth, is against feeding tube   OSA: On CPAP    Discharge Condition: Stable without hypoxia or new concerns  Follow UP   Follow-up Information     Sasser, Ky Phillips, MD Follow up in 1 week(s).   Specialty: Family Medicine Why: If symptoms worsen, As needed Contact information: [address] [place] [address] [phone]                Diet and Activity recommendation:  As advised  Discharge Instructions    Discharge Instructions     Call MD for:  persistant nausea and vomiting   Complete by: As  directed    Call MD for:  temperature >100.4   Complete by: As directed    Diet - low sodium heart healthy   Complete by: As directed    Discharge instructions   Complete by: As directed    1) please maintain adequate hydration----avoid dehydration, use Zofran  as needed for nausea and vomiting   Increase activity slowly  Complete by: As directed        Discharge Medications     Allergies as of 05/31/2023       Reactions   Augmentin [amoxicillin-pot Clavulanate] Rash, Other (See Comments)   Blisters. Pt can take Amoxicillin, but NOT Augmentin   Bactrim [sulfamethoxazole-trimethoprim] Rash   Doxycycline Other (See Comments)   Bad reflux- long term use   Versed  [midazolam ]    Eyes stayed dilated    Whole Blood    Pushing too fast "Red Man Syndrome"   Cefazolin  Rash   Midazolam  Hcl Other (See Comments)   Un-reactive pupils after surgery   Septra [bactrim] Hives   Vancomycin Rash        Medication List     TAKE these medications    BACLOFEN  IT 1,449 mcg by Intrathecal route continuous.   cephALEXin 250 MG capsule Commonly known as: KEFLEX Take 250 mg by mouth See admin instructions. 90 day cycle   diltiazem  90 MG 12 hr capsule Commonly known as: CARDIZEM  SR TAKE 1 CAPSULE(90 MG) BY MOUTH TWICE DAILY What changed: See the new instructions.   ketoconazole 2 % cream Commonly known as: NIZORAL Apply 1 Application topically daily as needed for irritation.   ketoconazole 2 % shampoo Commonly known as: NIZORAL Apply 1 application  topically every other day.   lacosamide  200 MG Tabs tablet Commonly known as: VIMPAT  Take 200 mg by mouth 2 (two) times daily.   lactobacillus Pack Take 1 packet (1 g total) by mouth 3 (three) times daily with meals.   lidocaine -prilocaine cream Commonly known as: EMLA Apply 1 Application topically as needed (pain).   Linzess  145 MCG Caps capsule Generic drug: linaclotide  Take 145 mcg by mouth daily.   nitrofurantoin 100 MG  capsule Commonly known as: MACRODANTIN Take 100 mg by mouth daily. 90 day cycle   omeprazole 40 MG capsule Commonly known as: PRILOSEC Take 40 mg by mouth 2 (two) times daily.   ondansetron  4 MG tablet Commonly known as: Zofran  Take 1 tablet (4 mg total) by mouth daily as needed for nausea or vomiting.   polyethylene glycol 17 g packet Commonly known as: MIRALAX  / GLYCOLAX  Take 8.5 g by mouth daily.   Restasis  0.05 % ophthalmic emulsion Generic drug: cycloSPORINE  Place 1 drop into both eyes 2 (two) times daily.   sodium chloride  HYPERTONIC 3 % nebulizer solution Take 4 mLs by nebulization 3 (three) times daily.   sucralfate  1 GM/10ML suspension Commonly known as: CARAFATE  Take 500 mg by mouth 2 (two) times daily.   tamsulosin  0.4 MG Caps capsule Commonly known as: FLOMAX  Take 0.4 mg by mouth daily.   tretinoin 0.025 % cream Commonly known as: RETIN-A Apply 1 Application topically at bedtime as needed (irritation). Apply a a thin layer to affected areas nightly 20 min after washing. Start with two nights weekly and increase as tolerated.   Urea 40 % Lotn Apply 1 Application topically 2 (two) times daily as needed (KERTOSIS PILARIS ON ARMS). APPLY 1-2 TIMES DAILY TO KERTOSIS PILARIS ON ARMS       Major procedures and Radiology Reports - PLEASE review detailed and final reports for all details, in brief -   CT ABDOMEN PELVIS W CONTRAST Result Date: 05/29/2023 CLINICAL DATA:  Vomiting. EXAM: CT ABDOMEN AND PELVIS WITH CONTRAST TECHNIQUE: Multidetector CT imaging of the abdomen and pelvis was performed using the standard protocol following bolus administration of intravenous contrast. RADIATION DOSE REDUCTION: This exam was performed according to the departmental  dose-optimization program which includes automated exposure control, adjustment of the mA and/or kV according to patient size and/or use of iterative reconstruction technique. CONTRAST:  80mL OMNIPAQUE  IOHEXOL  300  MG/ML  SOLN COMPARISON:  December 06, 2020 FINDINGS: Lower chest: Mild atelectatic changes are seen within the bilateral lung bases. Hepatobiliary: A 10 mm cyst versus hemangioma (approximately 36.08 Hounsfield units) is seen within the anterior aspect of the right lobe of the liver. No gallstones, gallbladder wall thickening, or biliary dilatation. Pancreas: Unremarkable. No pancreatic ductal dilatation or surrounding inflammatory changes. Spleen: Normal in size without focal abnormality. Adrenals/Urinary Tract: Adrenal glands are unremarkable. The left kidney is surgically absent. The right kidney is normal in size, without renal calculi or hydronephrosis. Subcentimeter simple cysts are seen within the right kidney. Mild to moderate severity diffuse urinary bladder wall thickening is noted. Stomach/Bowel: There is mild diffuse thickening of the distal esophagus. Stomach is within normal limits. The appendix is not clearly identified. Stool is seen throughout the large bowel. No evidence of bowel wall thickening, distention, or inflammatory changes. Vascular/Lymphatic: No significant vascular findings are present. No enlarged abdominal or pelvic lymph nodes. Reproductive: There is moderate to marked severity prostate gland enlargement. Other: There is a small, stable fat containing umbilical hernia. Abdominopelvic ascites. Musculoskeletal: Extensive postoperative changes are seen throughout the thoracolumbar spine, with additional postoperative changes seen involving the proximal right femur. Congenital deformities are seen involving both hips. No acute osseous abnormalities are identified. IMPRESSION: 1. Mild diffuse thickening of the distal esophagus which may represent sequelae associated with esophagitis. 2. Mild to moderate severity diffuse urinary bladder wall thickening which may be secondary to chronic bladder outlet obstruction. Correlation with urinalysis is recommended to exclude the presence of  cystitis. 3. Moderate to marked severity prostate gland enlargement. Correlation with PSA levels is recommended. 4. Small, stable fat containing umbilical hernia. 5. Extensive postoperative changes throughout the thoracolumbar spine and proximal right femur. 6. Congenital deformities involving both hips. Electronically Signed   By: Virgle Grime M.D.   On: 05/29/2023 22:39   DG Chest 2 View Result Date: 05/29/2023 CLINICAL DATA:  Shortness of breath EXAM: CHEST - 2 VIEW COMPARISON:  12/11/2020 FINDINGS: Posterior spinal rods are again noted. No significant pleural effusion. Streaky atelectasis at the bases. Stable cardiomediastinal silhouette. No pneumothorax. Chronic catheter or tubing at the cavoatrial junction with additional tubing or catheter visualized over the neck and left paraspinal region. IMPRESSION: Streaky atelectasis at the bases. Electronically Signed   By: Esmeralda Hedge M.D.   On: 05/29/2023 20:11   Micro Results   Recent Results (from the past 240 hours)  Resp panel by RT-PCR (RSV, Flu A&B, Covid) Anterior Nasal Swab     Status: None   Collection Time: 05/29/23  6:45 PM   Specimen: Anterior Nasal Swab  Result Value Ref Range Status   SARS Coronavirus 2 by RT PCR NEGATIVE NEGATIVE Final    Comment: (NOTE) SARS-CoV-2 target nucleic acids are NOT DETECTED.  The SARS-CoV-2 RNA is generally detectable in upper respiratory specimens during the acute phase of infection. The lowest concentration of SARS-CoV-2 viral copies this assay can detect is 138 copies/mL. A negative result does not preclude SARS-Cov-2 infection and should not be used as the sole basis for treatment or other patient management decisions. A negative result may occur with  improper specimen collection/handling, submission of specimen other than nasopharyngeal swab, presence of viral mutation(s) within the areas targeted by this assay, and inadequate number of viral copies(<138 copies/mL).  A negative result  must be combined with clinical observations, patient history, and epidemiological information. The expected result is Negative.  Fact Sheet for Patients:  BloggerCourse.com  Fact Sheet for Healthcare Providers:  SeriousBroker.it  This test is no t yet approved or cleared by the [place]  FDA and  has been authorized for detection and/or diagnosis of SARS-CoV-2 by FDA under an Emergency Use Authorization (EUA). This EUA will remain  in effect (meaning this test can be used) for the duration of the COVID-19 declaration under Section 564(b)(1) of the Act, 21 U.S.C.section 360bbb-3(b)(1), unless the authorization is terminated  or revoked sooner.       Influenza A by PCR NEGATIVE NEGATIVE Final   Influenza B by PCR NEGATIVE NEGATIVE Final    Comment: (NOTE) The Xpert Xpress SARS-CoV-2/FLU/RSV plus assay is intended as an aid in the diagnosis of influenza from Nasopharyngeal swab specimens and should not be used as a sole basis for treatment. Nasal washings and aspirates are unacceptable for Xpert Xpress SARS-CoV-2/FLU/RSV testing.  Fact Sheet for Patients: BloggerCourse.com  Fact Sheet for Healthcare Providers: SeriousBroker.it  This test is not yet approved or cleared by the [place]  FDA and has been authorized for detection and/or diagnosis of SARS-CoV-2 by FDA under an Emergency Use Authorization (EUA). This EUA will remain in effect (meaning this test can be used) for the duration of the COVID-19 declaration under Section 564(b)(1) of the Act, 21 U.S.C. section 360bbb-3(b)(1), unless the authorization is terminated or revoked.     Resp Syncytial Virus by PCR NEGATIVE NEGATIVE Final    Comment: (NOTE) Fact Sheet for Patients: BloggerCourse.com  Fact Sheet for Healthcare Providers: SeriousBroker.it  This test is  not yet approved or cleared by the [place]  FDA and has been authorized for detection and/or diagnosis of SARS-CoV-2 by FDA under an Emergency Use Authorization (EUA). This EUA will remain in effect (meaning this test can be used) for the duration of the COVID-19 declaration under Section 564(b)(1) of the Act, 21 U.S.C. section 360bbb-3(b)(1), unless the authorization is terminated or revoked.  Performed at [facility], [address], [place], [place] [address]   Blood culture (routine x 2)     Status: None (Preliminary result)   Collection Time: 05/30/23 12:01 AM   Specimen: BLOOD  Result Value Ref Range Status   Specimen Description BLOOD BLOOD RIGHT HAND  Final   Special Requests   Final    BOTTLES DRAWN AEROBIC AND ANAEROBIC Blood Culture results may not be optimal due to an inadequate volume of blood received in culture bottles   Culture   Final    NO GROWTH 1 DAY Performed at [facility], [address], [place], [place] [address]    Report Status PENDING  Incomplete  Blood culture (routine x 2)     Status: None (Preliminary result)   Collection Time: 05/30/23 12:01 AM   Specimen: BLOOD  Result Value Ref Range Status   Specimen Description BLOOD BLOOD LEFT HAND  Final   Special Requests   Final    BOTTLES DRAWN AEROBIC AND ANAEROBIC Blood Culture adequate volume   Culture   Final    NO GROWTH 1 DAY Performed at [facility], [address] [place], [place] [address]    Report Status PENDING  Incomplete  Gastrointestinal Panel by PCR , Stool     Status: None   Collection Time: 05/30/23 12:37 AM   Specimen: Urine, Catheterized; Stool  Result Value Ref Range Status  Campylobacter species NOT DETECTED NOT DETECTED Final   Plesimonas shigelloides NOT DETECTED NOT DETECTED Final   Salmonella species NOT DETECTED NOT DETECTED Final   Yersinia enterocolitica NOT DETECTED NOT DETECTED Final   Vibrio species NOT DETECTED NOT DETECTED Final   Vibrio cholerae NOT DETECTED  NOT DETECTED Final   Enteroaggregative E coli (EAEC) NOT DETECTED NOT DETECTED Final   Enteropathogenic E coli (EPEC) NOT DETECTED NOT DETECTED Final   Enterotoxigenic E coli (ETEC) NOT DETECTED NOT DETECTED Final   Shiga like toxin producing E coli (STEC) NOT DETECTED NOT DETECTED Final   Shigella/Enteroinvasive E coli (EIEC) NOT DETECTED NOT DETECTED Final   Cryptosporidium NOT DETECTED NOT DETECTED Final   Cyclospora cayetanensis NOT DETECTED NOT DETECTED Final   Entamoeba histolytica NOT DETECTED NOT DETECTED Final   Giardia lamblia NOT DETECTED NOT DETECTED Final   Adenovirus F40/41 NOT DETECTED NOT DETECTED Final   Astrovirus NOT DETECTED NOT DETECTED Final   Norovirus GI/GII NOT DETECTED NOT DETECTED Final   Rotavirus A NOT DETECTED NOT DETECTED Final   Sapovirus (I, II, IV, and V) NOT DETECTED NOT DETECTED Final    Comment: Performed at [facility], [address] [place], [place] [address]  C Difficile Quick Screen w PCR reflex     Status: None   Collection Time: 05/30/23 12:37 AM   Specimen: Urine, Catheterized; Stool  Result Value Ref Range Status   C Diff antigen NEGATIVE NEGATIVE Final   C Diff toxin NEGATIVE NEGATIVE Final   C Diff interpretation No C. difficile detected.  Final    Comment: Performed at [facility], [address], [place], [place] [address]  Respiratory (~20 pathogens) panel by PCR     Status: None   Collection Time: 05/30/23  7:18 AM   Specimen: Nasopharyngeal Swab; Respiratory  Result Value Ref Range Status   Adenovirus NOT DETECTED NOT DETECTED Final   Coronavirus 229E NOT DETECTED NOT DETECTED Final    Comment: (NOTE) The Coronavirus on the Respiratory Panel, DOES NOT test for the novel  Coronavirus (2019 nCoV)    Coronavirus HKU1 NOT DETECTED NOT DETECTED Final   Coronavirus NL63 NOT DETECTED NOT DETECTED Final   Coronavirus OC43 NOT DETECTED NOT DETECTED Final   Metapneumovirus NOT DETECTED NOT DETECTED Final   Rhinovirus /  Enterovirus NOT DETECTED NOT DETECTED Final   Influenza A NOT DETECTED NOT DETECTED Final   Influenza B NOT DETECTED NOT DETECTED Final   Parainfluenza Virus 1 NOT DETECTED NOT DETECTED Final   Parainfluenza Virus 2 NOT DETECTED NOT DETECTED Final   Parainfluenza Virus 3 NOT DETECTED NOT DETECTED Final   Parainfluenza Virus 4 NOT DETECTED NOT DETECTED Final   Respiratory Syncytial Virus NOT DETECTED NOT DETECTED Final   Bordetella pertussis NOT DETECTED NOT DETECTED Final   Bordetella Parapertussis NOT DETECTED NOT DETECTED Final   Chlamydophila pneumoniae NOT DETECTED NOT DETECTED Final   Mycoplasma pneumoniae NOT DETECTED NOT DETECTED Final    Comment: Performed at [facility], [address], [place], [place] [address]   Today   Subjective    Joshua Logan today has no new complaints - Mom at bedside, questions answered - Eating and drinking well - Voiding well after Foley removal No fever  Or chills   No Nausea, Vomiting or Diarrhea       Patient has been seen and examined prior to discharge   Objective   Blood pressure 125/71, pulse (!) 101, temperature 98.8 F (37.1 C),  temperature source Axillary, resp. rate 17, height 5\' 9"  (1.753 m), weight 55.7 kg, SpO2 96%.  Exam Gen:- Awake Alert, no acute distress  HEENT:- [place].AT, No sclera icterus Neck-Supple Neck,No JVD,.  Lungs-  CTAB , good air movement bilaterally CV- S1, S2 normal, regular Abd-  +ve B.Sounds, Abd Soft, No tenderness, baclofen  pump noted on anterior abdominal wall Extremity/Skin:- No  edema,   good pulses Psych-affect is appropriate, oriented Neuro-cerebral palsy with chronic neuro muscular deficits     Data Review   CBC w Diff:  Lab Results  Component Value Date   WBC 6.3 05/31/2023   HGB 11.5 (L) 05/31/2023   HCT 35.1 (L) 05/31/2023   PLT 180 05/31/2023   LYMPHOPCT 8 05/29/2023   MONOPCT 7 05/29/2023   EOSPCT 1 05/29/2023   BASOPCT 0 05/29/2023   CMP:  Lab Results  Component Value  Date   NA 142 05/31/2023   K 4.1 05/31/2023   CL 106 05/31/2023   CO2 29 05/31/2023   BUN 8 05/31/2023   CREATININE 0.69 05/31/2023   PROT 8.6 (H) 05/29/2023   ALBUMIN  4.4 05/29/2023   BILITOT 0.4 05/29/2023   ALKPHOS 135 (H) 05/29/2023   AST 17 05/29/2023   ALT 11 05/29/2023  .  Total Discharge time is about 33 minutes  Colin Dawley M.D on 05/31/2023 at 4:21 PM  Go to [email or web address] -  for contact info  [facility] - Office  [phone]

## 2023-05-31 NOTE — Plan of Care (Signed)

## 2023-05-31 NOTE — Progress Notes (Signed)
 Palliative: [name], Joshua Logan, is sitting up in bed.  He appears chronically ill.  He greets me, making and somewhat keeping eye contact.  Although he is nonverbal, he can make his basic needs known with the assistance of his mother, Joshua Logan, who is present at bedside.  We talked about Joshua Logan's improvements in the treatment plan.  We talked in detail about labs and CT results.  We talked about symptom management for nausea and vomiting.  Joshua Logan states that she feels that Joshua Logan is much better and, if possible, she feels that they would do well at home.  I shared that I will discuss with the hospitalist attending, in regards to antibiotic needs.   We again discussed the benefits of outpatient palliative services, patient and family considering.  Conference with attending, bedside nursing staff, transition of care team related to patient condition, needs, goals of care, disposition.  Plan: Continue to treat the treatable but no intubation.  Prefers to return home if possible.  Private vehicle transport home.  No equipment needs.  Encouraged outpatient palliative services  50 minutes Arla Lab, NP Palliative medicine team Team phone [phone]

## 2023-05-31 NOTE — Discharge Instructions (Signed)
 1) please maintain adequate hydration----avoid dehydration, use Zofran  as needed for nausea and vomiting

## 2023-05-31 NOTE — Plan of Care (Signed)
  Problem: Education: Goal: Knowledge of General Education information will improve Description: Including pain rating scale, medication(s)/side effects and non-pharmacologic comfort measures 05/31/2023 1628 by Goldie Later, RN Outcome: Adequate for Discharge 05/31/2023 1020 by Jenet Durio, Leverne Reading, RN Outcome: Progressing   Problem: Health Behavior/Discharge Planning: Goal: Ability to manage health-related needs will improve 05/31/2023 1628 by Goldie Later, RN Outcome: Adequate for Discharge 05/31/2023 1020 by Goldie Later, RN Outcome: Progressing   Problem: Clinical Measurements: Goal: Ability to maintain clinical measurements within normal limits will improve 05/31/2023 1628 by Goldie Later, RN Outcome: Adequate for Discharge 05/31/2023 1020 by Goldie Later, RN Outcome: Progressing Goal: Will remain free from infection 05/31/2023 1628 by Goldie Later, RN Outcome: Adequate for Discharge 05/31/2023 1020 by Goldie Later, RN Outcome: Progressing Goal: Diagnostic test results will improve 05/31/2023 1628 by Goldie Later, RN Outcome: Adequate for Discharge 05/31/2023 1020 by Goldie Later, RN Outcome: Progressing Goal: Respiratory complications will improve 05/31/2023 1628 by Goldie Later, RN Outcome: Adequate for Discharge 05/31/2023 1020 by Goldie Later, RN Outcome: Progressing Goal: Cardiovascular complication will be avoided 05/31/2023 1628 by Goldie Later, RN Outcome: Adequate for Discharge 05/31/2023 1020 by Goldie Later, RN Outcome: Progressing   Problem: Activity: Goal: Risk for activity intolerance will decrease 05/31/2023 1628 by Goldie Later, RN Outcome: Adequate for Discharge 05/31/2023 1020 by Goldie Later, RN Outcome: Progressing   Problem: Nutrition: Goal: Adequate nutrition will be maintained 05/31/2023 1628 by Goldie Later, RN Outcome: Adequate for Discharge 05/31/2023 1020 by Goldie Later, RN Outcome: Progressing   Problem: Coping: Goal: Level of anxiety  will decrease 05/31/2023 1628 by Goldie Later, RN Outcome: Adequate for Discharge 05/31/2023 1020 by Goldie Later, RN Outcome: Progressing   Problem: Elimination: Goal: Will not experience complications related to bowel motility 05/31/2023 1628 by Goldie Later, RN Outcome: Adequate for Discharge 05/31/2023 1020 by Goldie Later, RN Outcome: Progressing Goal: Will not experience complications related to urinary retention 05/31/2023 1628 by Goldie Later, RN Outcome: Adequate for Discharge 05/31/2023 1020 by Goldie Later, RN Outcome: Progressing   Problem: Pain Managment: Goal: General experience of comfort will improve and/or be controlled 05/31/2023 1628 by Goldie Later, RN Outcome: Adequate for Discharge 05/31/2023 1020 by Goldie Later, RN Outcome: Progressing   Problem: Safety: Goal: Ability to remain free from injury will improve 05/31/2023 1628 by Goldie Later, RN Outcome: Adequate for Discharge 05/31/2023 1020 by Goldie Later, RN Outcome: Progressing   Problem: Skin Integrity: Goal: Risk for impaired skin integrity will decrease 05/31/2023 1628 by Goldie Later, RN Outcome: Adequate for Discharge 05/31/2023 1020 by Goldie Later, RN Outcome: Progressing

## 2023-05-31 NOTE — Progress Notes (Signed)
 Spoke with patient's mom about giving patient treatment, mom wanted patient to be able to sleep a little longer, patient is still on CPAP.

## 2023-05-31 NOTE — Plan of Care (Signed)

## 2023-06-02 ENCOUNTER — Encounter: Payer: Self-pay | Admitting: Cardiology

## 2023-06-04 LAB — CULTURE, BLOOD (ROUTINE X 2)
Culture: NO GROWTH
Culture: NO GROWTH
Special Requests: ADEQUATE

## 2023-06-05 DIAGNOSIS — K2289 Other specified disease of esophagus: Secondary | ICD-10-CM | POA: Diagnosis not present

## 2023-06-05 DIAGNOSIS — K769 Liver disease, unspecified: Secondary | ICD-10-CM | POA: Diagnosis not present

## 2023-06-07 DIAGNOSIS — K219 Gastro-esophageal reflux disease without esophagitis: Secondary | ICD-10-CM | POA: Diagnosis not present

## 2023-06-07 DIAGNOSIS — G4731 Primary central sleep apnea: Secondary | ICD-10-CM | POA: Diagnosis not present

## 2023-06-07 DIAGNOSIS — Z1331 Encounter for screening for depression: Secondary | ICD-10-CM | POA: Diagnosis not present

## 2023-06-07 DIAGNOSIS — R1312 Dysphagia, oropharyngeal phase: Secondary | ICD-10-CM | POA: Diagnosis not present

## 2023-06-07 DIAGNOSIS — K5901 Slow transit constipation: Secondary | ICD-10-CM | POA: Diagnosis not present

## 2023-06-23 DIAGNOSIS — G4731 Primary central sleep apnea: Secondary | ICD-10-CM | POA: Diagnosis not present

## 2023-06-23 DIAGNOSIS — J410 Simple chronic bronchitis: Secondary | ICD-10-CM | POA: Diagnosis not present

## 2023-06-23 DIAGNOSIS — J9611 Chronic respiratory failure with hypoxia: Secondary | ICD-10-CM | POA: Diagnosis not present

## 2023-06-23 DIAGNOSIS — G809 Cerebral palsy, unspecified: Secondary | ICD-10-CM | POA: Diagnosis not present

## 2023-06-23 DIAGNOSIS — J300 Vasomotor rhinitis: Secondary | ICD-10-CM | POA: Diagnosis not present

## 2023-06-28 DIAGNOSIS — Z79899 Other long term (current) drug therapy: Secondary | ICD-10-CM | POA: Diagnosis not present

## 2023-06-28 DIAGNOSIS — K209 Esophagitis, unspecified without bleeding: Secondary | ICD-10-CM | POA: Diagnosis not present

## 2023-06-28 DIAGNOSIS — K219 Gastro-esophageal reflux disease without esophagitis: Secondary | ICD-10-CM | POA: Diagnosis not present

## 2023-06-28 DIAGNOSIS — K317 Polyp of stomach and duodenum: Secondary | ICD-10-CM | POA: Diagnosis not present

## 2023-06-28 DIAGNOSIS — K3189 Other diseases of stomach and duodenum: Secondary | ICD-10-CM | POA: Diagnosis not present

## 2023-06-28 DIAGNOSIS — R933 Abnormal findings on diagnostic imaging of other parts of digestive tract: Secondary | ICD-10-CM | POA: Diagnosis not present

## 2023-06-28 DIAGNOSIS — G4733 Obstructive sleep apnea (adult) (pediatric): Secondary | ICD-10-CM | POA: Diagnosis not present

## 2023-06-28 DIAGNOSIS — Z880 Allergy status to penicillin: Secondary | ICD-10-CM | POA: Diagnosis not present

## 2023-06-28 DIAGNOSIS — K2289 Other specified disease of esophagus: Secondary | ICD-10-CM | POA: Diagnosis not present

## 2023-06-28 DIAGNOSIS — Z8719 Personal history of other diseases of the digestive system: Secondary | ICD-10-CM | POA: Diagnosis not present

## 2023-06-28 DIAGNOSIS — G800 Spastic quadriplegic cerebral palsy: Secondary | ICD-10-CM | POA: Diagnosis not present

## 2023-06-28 DIAGNOSIS — Z881 Allergy status to other antibiotic agents status: Secondary | ICD-10-CM | POA: Diagnosis not present

## 2023-07-01 DIAGNOSIS — N319 Neuromuscular dysfunction of bladder, unspecified: Secondary | ICD-10-CM | POA: Diagnosis not present

## 2023-07-01 DIAGNOSIS — G40319 Generalized idiopathic epilepsy and epileptic syndromes, intractable, without status epilepticus: Secondary | ICD-10-CM | POA: Diagnosis not present

## 2023-07-01 DIAGNOSIS — G800 Spastic quadriplegic cerebral palsy: Secondary | ICD-10-CM | POA: Diagnosis not present

## 2023-07-01 DIAGNOSIS — G4731 Primary central sleep apnea: Secondary | ICD-10-CM | POA: Diagnosis not present

## 2023-07-03 DIAGNOSIS — Z978 Presence of other specified devices: Secondary | ICD-10-CM | POA: Diagnosis not present

## 2023-07-03 DIAGNOSIS — Z451 Encounter for adjustment and management of infusion pump: Secondary | ICD-10-CM | POA: Diagnosis not present

## 2023-07-03 DIAGNOSIS — G808 Other cerebral palsy: Secondary | ICD-10-CM | POA: Diagnosis not present

## 2023-07-07 DIAGNOSIS — L89312 Pressure ulcer of right buttock, stage 2: Secondary | ICD-10-CM | POA: Diagnosis not present

## 2023-07-07 DIAGNOSIS — G40319 Generalized idiopathic epilepsy and epileptic syndromes, intractable, without status epilepticus: Secondary | ICD-10-CM | POA: Diagnosis not present

## 2023-07-07 DIAGNOSIS — K5900 Constipation, unspecified: Secondary | ICD-10-CM | POA: Diagnosis not present

## 2023-07-07 DIAGNOSIS — G800 Spastic quadriplegic cerebral palsy: Secondary | ICD-10-CM | POA: Diagnosis not present

## 2023-07-07 DIAGNOSIS — R32 Unspecified urinary incontinence: Secondary | ICD-10-CM | POA: Diagnosis not present

## 2023-07-07 DIAGNOSIS — K2100 Gastro-esophageal reflux disease with esophagitis, without bleeding: Secondary | ICD-10-CM | POA: Diagnosis not present

## 2023-07-07 DIAGNOSIS — I10 Essential (primary) hypertension: Secondary | ICD-10-CM | POA: Diagnosis not present

## 2023-07-07 DIAGNOSIS — G4731 Primary central sleep apnea: Secondary | ICD-10-CM | POA: Diagnosis not present

## 2023-07-07 DIAGNOSIS — N319 Neuromuscular dysfunction of bladder, unspecified: Secondary | ICD-10-CM | POA: Diagnosis not present

## 2023-07-08 DIAGNOSIS — G4731 Primary central sleep apnea: Secondary | ICD-10-CM | POA: Diagnosis not present

## 2023-07-12 DIAGNOSIS — L89312 Pressure ulcer of right buttock, stage 2: Secondary | ICD-10-CM | POA: Diagnosis not present

## 2023-07-12 DIAGNOSIS — K5900 Constipation, unspecified: Secondary | ICD-10-CM | POA: Diagnosis not present

## 2023-07-12 DIAGNOSIS — R32 Unspecified urinary incontinence: Secondary | ICD-10-CM | POA: Diagnosis not present

## 2023-07-12 DIAGNOSIS — K2100 Gastro-esophageal reflux disease with esophagitis, without bleeding: Secondary | ICD-10-CM | POA: Diagnosis not present

## 2023-07-12 DIAGNOSIS — G800 Spastic quadriplegic cerebral palsy: Secondary | ICD-10-CM | POA: Diagnosis not present

## 2023-07-12 DIAGNOSIS — G40319 Generalized idiopathic epilepsy and epileptic syndromes, intractable, without status epilepticus: Secondary | ICD-10-CM | POA: Diagnosis not present

## 2023-07-12 DIAGNOSIS — I10 Essential (primary) hypertension: Secondary | ICD-10-CM | POA: Diagnosis not present

## 2023-07-12 DIAGNOSIS — G4731 Primary central sleep apnea: Secondary | ICD-10-CM | POA: Diagnosis not present

## 2023-07-12 DIAGNOSIS — N319 Neuromuscular dysfunction of bladder, unspecified: Secondary | ICD-10-CM | POA: Diagnosis not present

## 2023-07-18 DIAGNOSIS — J410 Simple chronic bronchitis: Secondary | ICD-10-CM | POA: Diagnosis not present

## 2023-07-19 DIAGNOSIS — K2100 Gastro-esophageal reflux disease with esophagitis, without bleeding: Secondary | ICD-10-CM | POA: Diagnosis not present

## 2023-07-19 DIAGNOSIS — R32 Unspecified urinary incontinence: Secondary | ICD-10-CM | POA: Diagnosis not present

## 2023-07-19 DIAGNOSIS — N319 Neuromuscular dysfunction of bladder, unspecified: Secondary | ICD-10-CM | POA: Diagnosis not present

## 2023-07-19 DIAGNOSIS — G800 Spastic quadriplegic cerebral palsy: Secondary | ICD-10-CM | POA: Diagnosis not present

## 2023-07-19 DIAGNOSIS — K5900 Constipation, unspecified: Secondary | ICD-10-CM | POA: Diagnosis not present

## 2023-07-19 DIAGNOSIS — L89312 Pressure ulcer of right buttock, stage 2: Secondary | ICD-10-CM | POA: Diagnosis not present

## 2023-07-19 DIAGNOSIS — I10 Essential (primary) hypertension: Secondary | ICD-10-CM | POA: Diagnosis not present

## 2023-07-19 DIAGNOSIS — G40319 Generalized idiopathic epilepsy and epileptic syndromes, intractable, without status epilepticus: Secondary | ICD-10-CM | POA: Diagnosis not present

## 2023-07-19 DIAGNOSIS — G4731 Primary central sleep apnea: Secondary | ICD-10-CM | POA: Diagnosis not present

## 2023-07-26 DIAGNOSIS — K5900 Constipation, unspecified: Secondary | ICD-10-CM | POA: Diagnosis not present

## 2023-07-26 DIAGNOSIS — I10 Essential (primary) hypertension: Secondary | ICD-10-CM | POA: Diagnosis not present

## 2023-07-26 DIAGNOSIS — G4731 Primary central sleep apnea: Secondary | ICD-10-CM | POA: Diagnosis not present

## 2023-07-26 DIAGNOSIS — R32 Unspecified urinary incontinence: Secondary | ICD-10-CM | POA: Diagnosis not present

## 2023-07-26 DIAGNOSIS — G800 Spastic quadriplegic cerebral palsy: Secondary | ICD-10-CM | POA: Diagnosis not present

## 2023-07-26 DIAGNOSIS — G40319 Generalized idiopathic epilepsy and epileptic syndromes, intractable, without status epilepticus: Secondary | ICD-10-CM | POA: Diagnosis not present

## 2023-07-26 DIAGNOSIS — L89312 Pressure ulcer of right buttock, stage 2: Secondary | ICD-10-CM | POA: Diagnosis not present

## 2023-07-26 DIAGNOSIS — K2100 Gastro-esophageal reflux disease with esophagitis, without bleeding: Secondary | ICD-10-CM | POA: Diagnosis not present

## 2023-07-26 DIAGNOSIS — N319 Neuromuscular dysfunction of bladder, unspecified: Secondary | ICD-10-CM | POA: Diagnosis not present

## 2023-08-02 DIAGNOSIS — G800 Spastic quadriplegic cerebral palsy: Secondary | ICD-10-CM | POA: Diagnosis not present

## 2023-08-06 DIAGNOSIS — G800 Spastic quadriplegic cerebral palsy: Secondary | ICD-10-CM | POA: Diagnosis not present

## 2023-08-06 DIAGNOSIS — G40319 Generalized idiopathic epilepsy and epileptic syndromes, intractable, without status epilepticus: Secondary | ICD-10-CM | POA: Diagnosis not present

## 2023-08-06 DIAGNOSIS — G4731 Primary central sleep apnea: Secondary | ICD-10-CM | POA: Diagnosis not present

## 2023-08-06 DIAGNOSIS — K5900 Constipation, unspecified: Secondary | ICD-10-CM | POA: Diagnosis not present

## 2023-08-06 DIAGNOSIS — K2100 Gastro-esophageal reflux disease with esophagitis, without bleeding: Secondary | ICD-10-CM | POA: Diagnosis not present

## 2023-08-06 DIAGNOSIS — L89312 Pressure ulcer of right buttock, stage 2: Secondary | ICD-10-CM | POA: Diagnosis not present

## 2023-08-06 DIAGNOSIS — N319 Neuromuscular dysfunction of bladder, unspecified: Secondary | ICD-10-CM | POA: Diagnosis not present

## 2023-08-06 DIAGNOSIS — I10 Essential (primary) hypertension: Secondary | ICD-10-CM | POA: Diagnosis not present

## 2023-08-06 DIAGNOSIS — R32 Unspecified urinary incontinence: Secondary | ICD-10-CM | POA: Diagnosis not present

## 2023-08-07 DIAGNOSIS — G4731 Primary central sleep apnea: Secondary | ICD-10-CM | POA: Diagnosis not present

## 2023-08-08 DIAGNOSIS — R300 Dysuria: Secondary | ICD-10-CM | POA: Diagnosis not present

## 2023-08-10 DIAGNOSIS — G800 Spastic quadriplegic cerebral palsy: Secondary | ICD-10-CM | POA: Diagnosis not present

## 2023-08-10 DIAGNOSIS — G809 Cerebral palsy, unspecified: Secondary | ICD-10-CM | POA: Diagnosis not present

## 2023-08-11 DIAGNOSIS — K2100 Gastro-esophageal reflux disease with esophagitis, without bleeding: Secondary | ICD-10-CM | POA: Diagnosis not present

## 2023-08-11 DIAGNOSIS — R32 Unspecified urinary incontinence: Secondary | ICD-10-CM | POA: Diagnosis not present

## 2023-08-11 DIAGNOSIS — L89312 Pressure ulcer of right buttock, stage 2: Secondary | ICD-10-CM | POA: Diagnosis not present

## 2023-08-11 DIAGNOSIS — G4731 Primary central sleep apnea: Secondary | ICD-10-CM | POA: Diagnosis not present

## 2023-08-11 DIAGNOSIS — K5900 Constipation, unspecified: Secondary | ICD-10-CM | POA: Diagnosis not present

## 2023-08-11 DIAGNOSIS — G800 Spastic quadriplegic cerebral palsy: Secondary | ICD-10-CM | POA: Diagnosis not present

## 2023-08-11 DIAGNOSIS — N319 Neuromuscular dysfunction of bladder, unspecified: Secondary | ICD-10-CM | POA: Diagnosis not present

## 2023-08-11 DIAGNOSIS — I10 Essential (primary) hypertension: Secondary | ICD-10-CM | POA: Diagnosis not present

## 2023-08-11 DIAGNOSIS — G40319 Generalized idiopathic epilepsy and epileptic syndromes, intractable, without status epilepticus: Secondary | ICD-10-CM | POA: Diagnosis not present

## 2023-08-15 DIAGNOSIS — R252 Cramp and spasm: Secondary | ICD-10-CM | POA: Diagnosis not present

## 2023-08-15 DIAGNOSIS — Z978 Presence of other specified devices: Secondary | ICD-10-CM | POA: Diagnosis not present

## 2023-08-19 DIAGNOSIS — J410 Simple chronic bronchitis: Secondary | ICD-10-CM | POA: Diagnosis not present

## 2023-08-23 DIAGNOSIS — N319 Neuromuscular dysfunction of bladder, unspecified: Secondary | ICD-10-CM | POA: Diagnosis not present

## 2023-08-23 DIAGNOSIS — K5901 Slow transit constipation: Secondary | ICD-10-CM | POA: Diagnosis not present

## 2023-08-23 DIAGNOSIS — Q600 Renal agenesis, unilateral: Secondary | ICD-10-CM | POA: Diagnosis not present

## 2023-08-24 DIAGNOSIS — R32 Unspecified urinary incontinence: Secondary | ICD-10-CM | POA: Diagnosis not present

## 2023-08-24 DIAGNOSIS — N319 Neuromuscular dysfunction of bladder, unspecified: Secondary | ICD-10-CM | POA: Diagnosis not present

## 2023-08-24 DIAGNOSIS — G4731 Primary central sleep apnea: Secondary | ICD-10-CM | POA: Diagnosis not present

## 2023-08-24 DIAGNOSIS — I10 Essential (primary) hypertension: Secondary | ICD-10-CM | POA: Diagnosis not present

## 2023-08-24 DIAGNOSIS — L89312 Pressure ulcer of right buttock, stage 2: Secondary | ICD-10-CM | POA: Diagnosis not present

## 2023-08-24 DIAGNOSIS — G800 Spastic quadriplegic cerebral palsy: Secondary | ICD-10-CM | POA: Diagnosis not present

## 2023-08-24 DIAGNOSIS — K5900 Constipation, unspecified: Secondary | ICD-10-CM | POA: Diagnosis not present

## 2023-08-24 DIAGNOSIS — K2100 Gastro-esophageal reflux disease with esophagitis, without bleeding: Secondary | ICD-10-CM | POA: Diagnosis not present

## 2023-08-24 DIAGNOSIS — G40319 Generalized idiopathic epilepsy and epileptic syndromes, intractable, without status epilepticus: Secondary | ICD-10-CM | POA: Diagnosis not present

## 2023-08-30 DIAGNOSIS — K219 Gastro-esophageal reflux disease without esophagitis: Secondary | ICD-10-CM | POA: Diagnosis not present

## 2023-08-30 DIAGNOSIS — K5901 Slow transit constipation: Secondary | ICD-10-CM | POA: Diagnosis not present

## 2023-08-30 IMAGING — CT CT RENAL STONE PROTOCOL
3 of 5 series · 16 of 46 positions shown, 18 images · non-contrast
Comparison: None.

CLINICAL DATA: Lower abdominal back pain, fever

EXAM:
CT ABDOMEN AND PELVIS WITHOUT CONTRAST
TECHNIQUE: Multidetector CT imaging of the abdomen and pelvis was performed
following the standard protocol without IV contrast. Unenhanced CT
was performed per clinician order. Lack of IV contrast limits
sensitivity and specificity, especially for evaluation of
abdominal/pelvic solid viscera.

[Series 2: axial st · axial · 0.94mm/px · z∈[+879,+1214]mm · 9 of 85 slices shown, 11 images]
[im 9/85  soft-tissue]
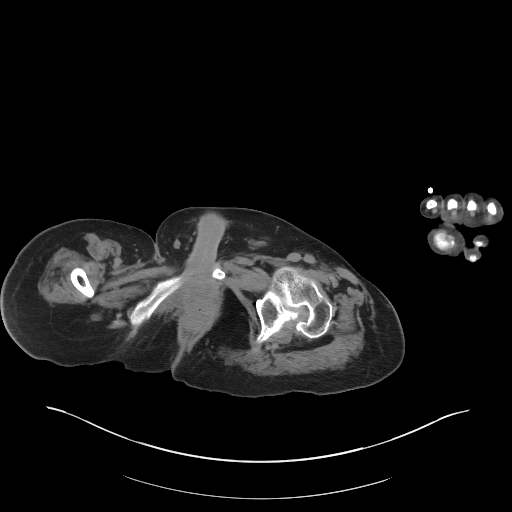
[im 9/85  bone]
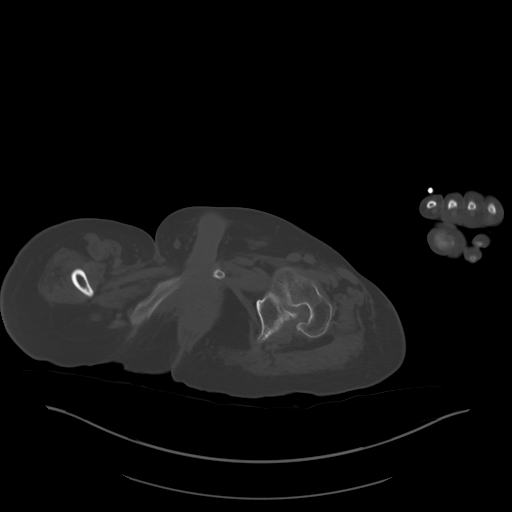
[im 17/85  soft-tissue]
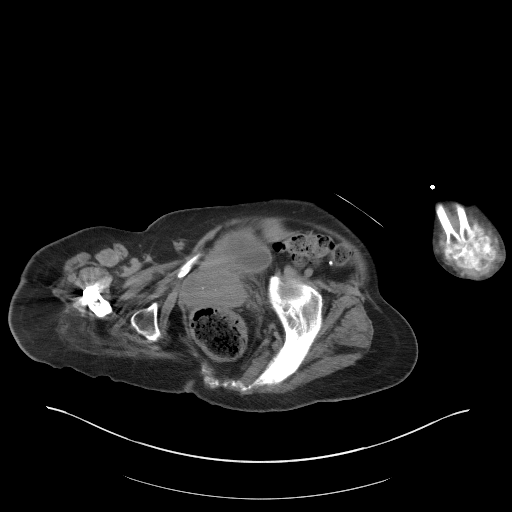
[im 26/85  soft-tissue]
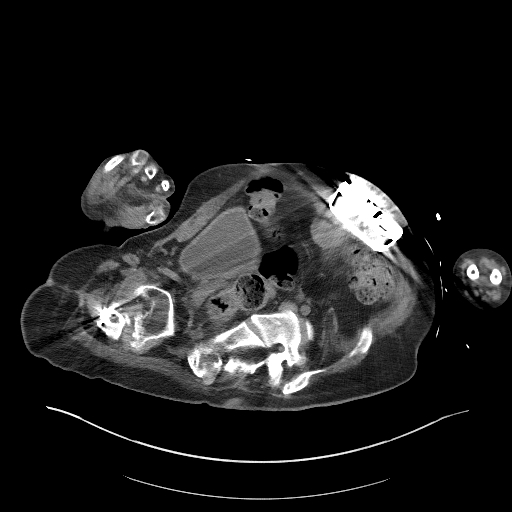
[im 34/85  soft-tissue]
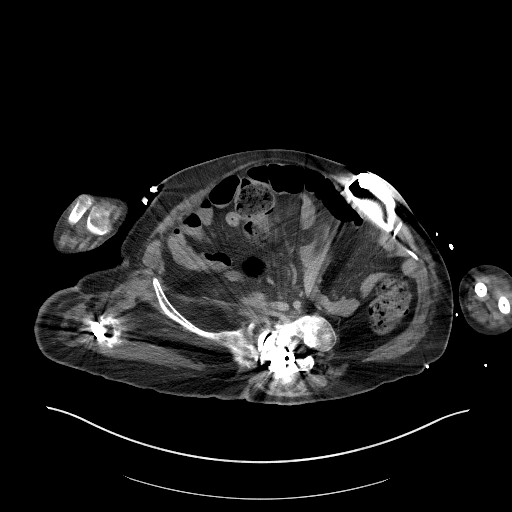
[im 43/85  soft-tissue]
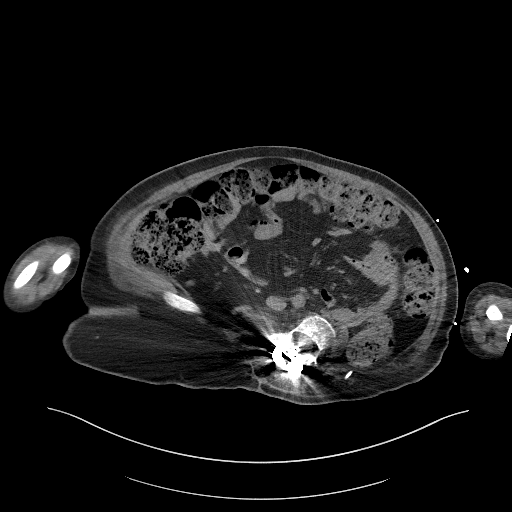
[im 51/85  soft-tissue]
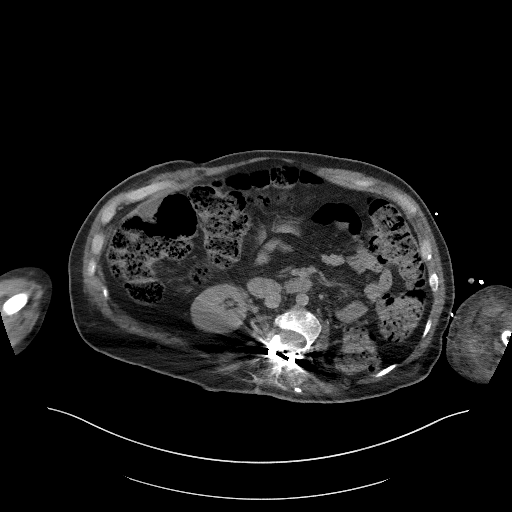
[im 59/85  soft-tissue]
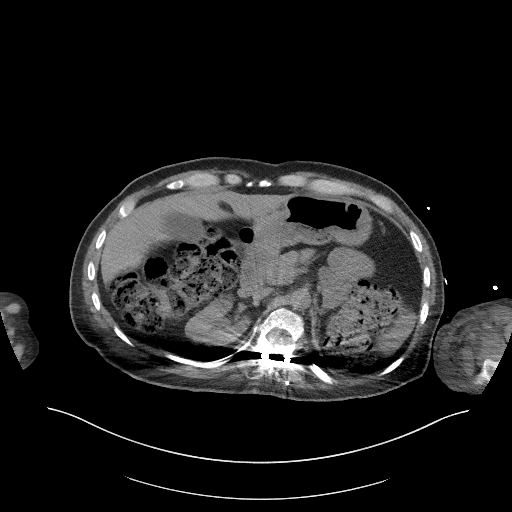
[im 68/85  soft-tissue]
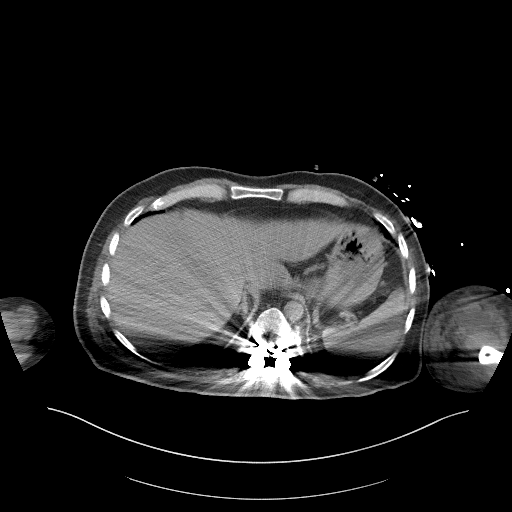
[im 76/85  soft-tissue]
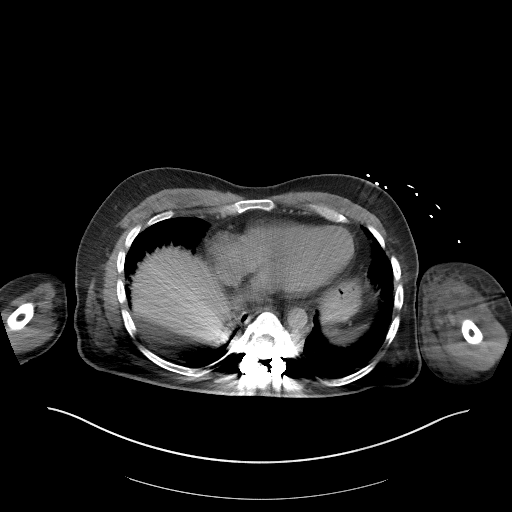
[im 76/85  bone]
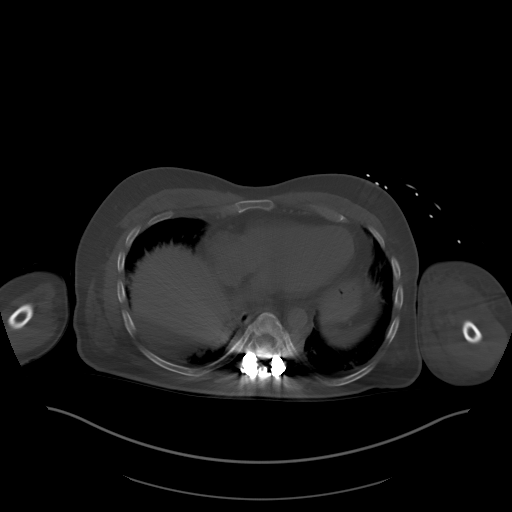

[Series 5: coronal st · coronal · 0.86mm/px · 3 of 91 slices shown]
[im 31/91  soft-tissue]
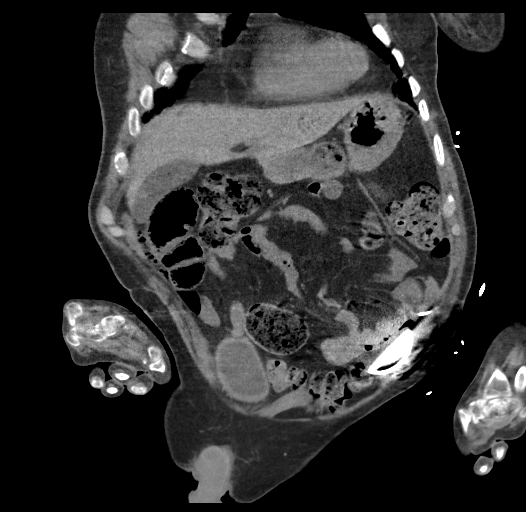
[im 41/91  soft-tissue]
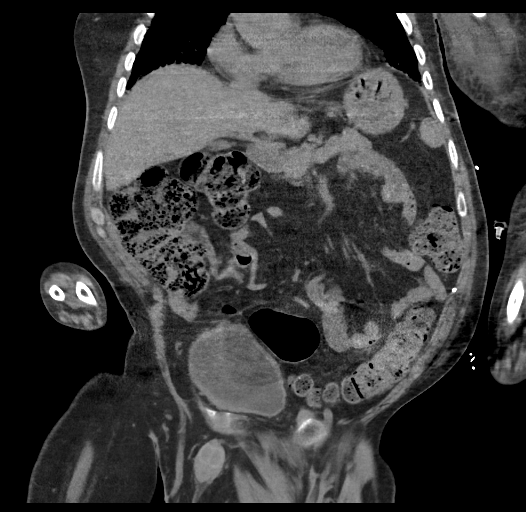
[im 51/91  soft-tissue]
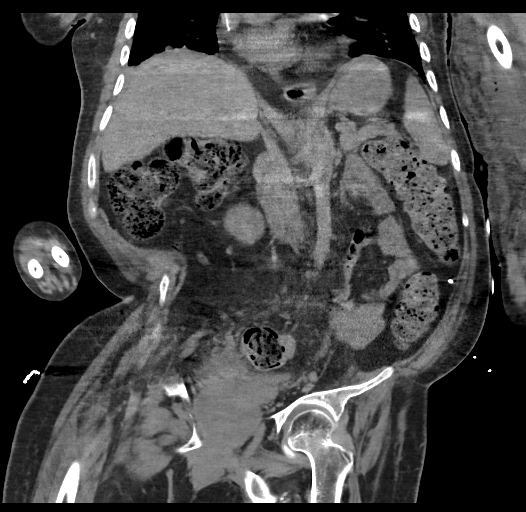

[Series 7: axial (person_name) (person_name) · axial · 0.94mm/px · z∈[+879,+1004]mm · 4 of 85 slices shown]
[im 9/85  soft-tissue]
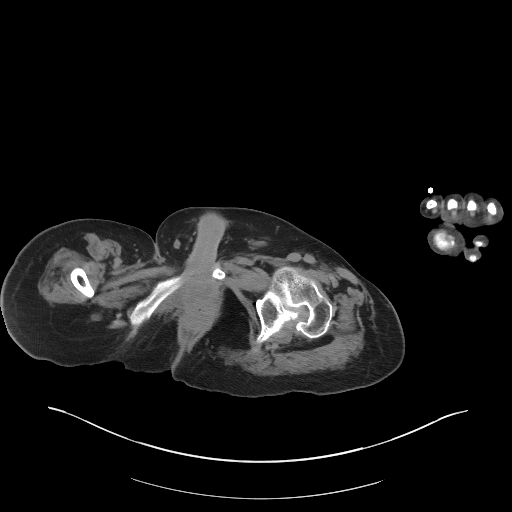
[im 17/85  soft-tissue]
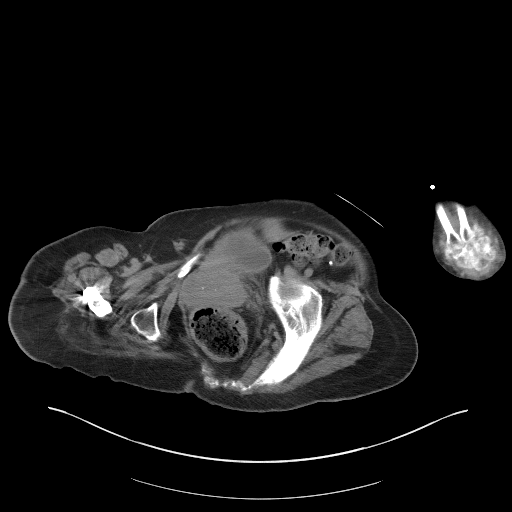
[im 26/85  soft-tissue]
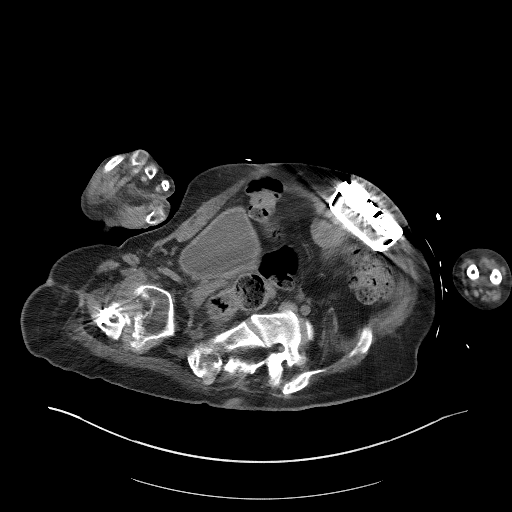
[im 34/85  soft-tissue]
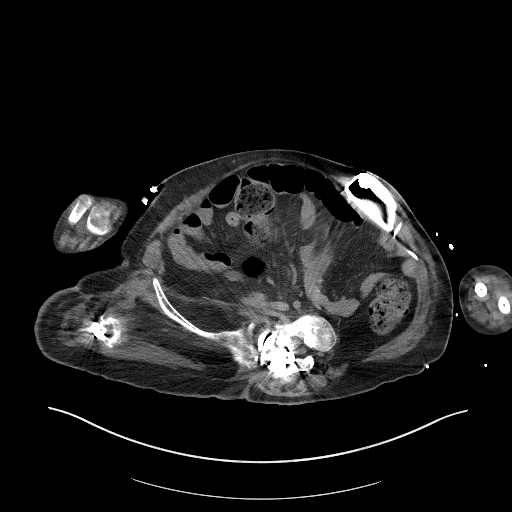

[16 of 46 positions shown; findings below may reference images not displayed]

FINDINGS: Lower chest: Minimal hypoventilatory changes at the lung bases. No
acute airspace disease.

Hepatobiliary: No focal liver abnormality is seen. No gallstones,
gallbladder wall thickening, or biliary dilatation.

Pancreas: Unremarkable. No pancreatic ductal dilatation or
surrounding inflammatory changes.

Spleen: Normal in size without focal abnormality.

Adrenals/Urinary Tract: The left kidney is surgically absent. Right
kidney is unremarkable without nephrolithiasis or hydronephrosis.
Bladder is moderately distended without focal abnormality. Adrenals
are grossly normal.

Stomach/Bowel: No bowel obstruction or ileus. Significant retained
stool throughout the colon compatible constipation. Normal appendix
right lower quadrant. No bowel wall thickening or inflammatory
change.

Vascular/Lymphatic: No significant vascular findings are present. No
enlarged abdominal or pelvic lymph nodes.

Reproductive: Moderate enlargement the prostate measuring up to
x 3.6 cm in transverse dimension.

Other: No free fluid or free gas. No abdominal wall hernia.
Implanted pump within the left lower quadrant anterior abdominal
wall, with catheter tubing extending toward the central canal. The
superior extent of the tubing is excluded by slice selection.

Musculoskeletal: Postsurgical changes are seen from thoracolumbar
fusion. There are no acute displaced fractures. Congenital
deformities of the bilateral hips, with postsurgical changes of the
proximal right femur. Reconstructed images demonstrate no additional
findings.
IMPRESSION: 1. No evidence of urinary tract calculi or obstructive uropathy.
Patient is status post left nephrectomy.
2. Significant retained stool throughout the colon consistent with
constipation. No bowel obstruction or ileus.
3. Enlarged prostate.

## 2023-09-01 IMAGING — DX DG CHEST 1V PORT
1 series · 1 of 1 positions shown · non-contrast
Comparison: Previous studies including the examination of
09/12/2020

CLINICAL DATA: Shortness of breath

EXAM:
PORTABLE CHEST 1 VIEW

[chest ap]
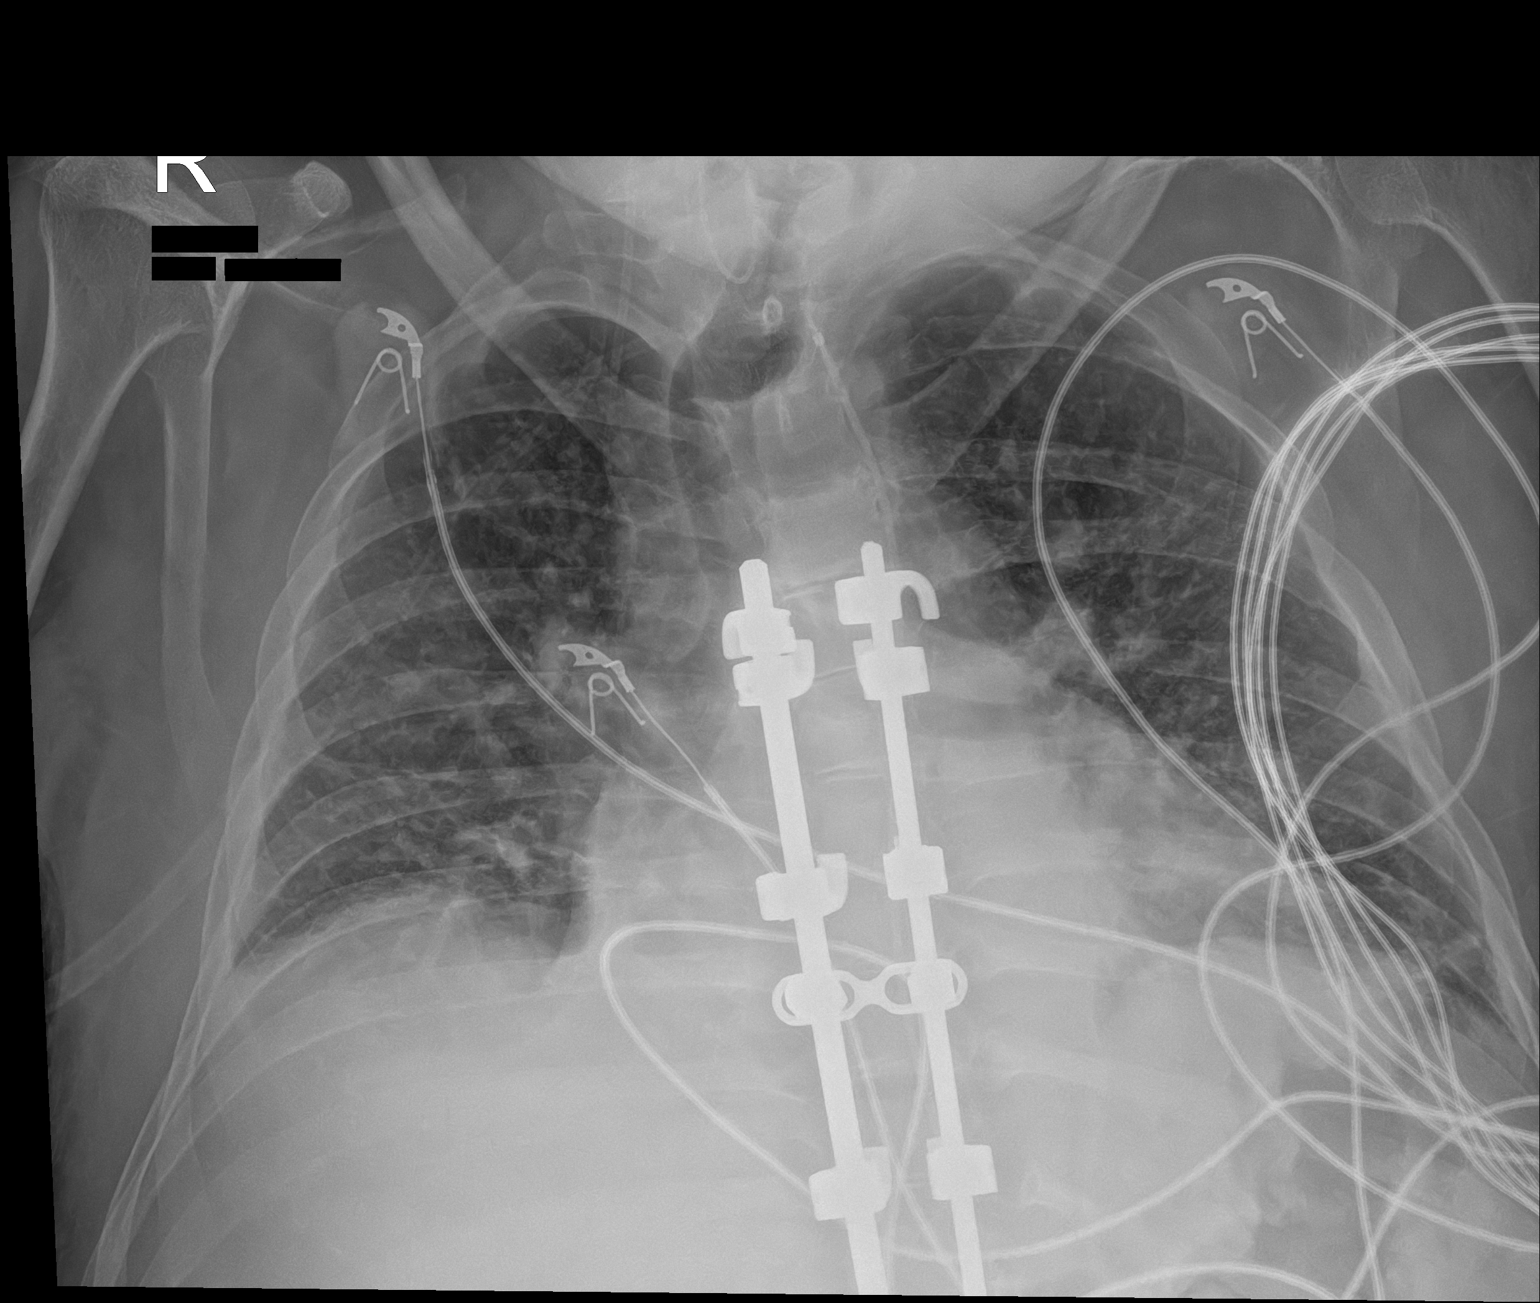

[1 of 1 positions shown; findings below may reference images not displayed]

FINDINGS: Transverse diameter of heart is increased. Central pulmonary vessels
are more prominent. There is poor inspiration. Increased
interstitial markings are seen in the parahilar regions and lower
lung fields. There is no focal consolidation. Right lateral CP angle
is indistinct. There is no pneumothorax.
IMPRESSION: Cardiomegaly. Central pulmonary vessels are more prominent with
increased interstitial markings in the parahilar regions and lower
lung fields suggesting CHF. There is no focal pulmonary
consolidation. Possible minimal right pleural effusion.

## 2023-09-04 DIAGNOSIS — L89312 Pressure ulcer of right buttock, stage 2: Secondary | ICD-10-CM | POA: Diagnosis not present

## 2023-09-04 DIAGNOSIS — G40319 Generalized idiopathic epilepsy and epileptic syndromes, intractable, without status epilepticus: Secondary | ICD-10-CM | POA: Diagnosis not present

## 2023-09-04 DIAGNOSIS — G800 Spastic quadriplegic cerebral palsy: Secondary | ICD-10-CM | POA: Diagnosis not present

## 2023-09-04 DIAGNOSIS — K2100 Gastro-esophageal reflux disease with esophagitis, without bleeding: Secondary | ICD-10-CM | POA: Diagnosis not present

## 2023-09-04 DIAGNOSIS — K5900 Constipation, unspecified: Secondary | ICD-10-CM | POA: Diagnosis not present

## 2023-09-04 DIAGNOSIS — N319 Neuromuscular dysfunction of bladder, unspecified: Secondary | ICD-10-CM | POA: Diagnosis not present

## 2023-09-04 DIAGNOSIS — I10 Essential (primary) hypertension: Secondary | ICD-10-CM | POA: Diagnosis not present

## 2023-09-04 DIAGNOSIS — R32 Unspecified urinary incontinence: Secondary | ICD-10-CM | POA: Diagnosis not present

## 2023-09-04 DIAGNOSIS — G4731 Primary central sleep apnea: Secondary | ICD-10-CM | POA: Diagnosis not present

## 2023-09-05 ENCOUNTER — Encounter (HOSPITAL_BASED_OUTPATIENT_CLINIC_OR_DEPARTMENT_OTHER): Attending: General Surgery | Admitting: General Surgery

## 2023-09-05 DIAGNOSIS — L89309 Pressure ulcer of unspecified buttock, unspecified stage: Secondary | ICD-10-CM | POA: Insufficient documentation

## 2023-09-05 DIAGNOSIS — L89322 Pressure ulcer of left buttock, stage 2: Secondary | ICD-10-CM | POA: Diagnosis not present

## 2023-09-05 DIAGNOSIS — G809 Cerebral palsy, unspecified: Secondary | ICD-10-CM | POA: Insufficient documentation

## 2023-09-07 DIAGNOSIS — G4731 Primary central sleep apnea: Secondary | ICD-10-CM | POA: Diagnosis not present

## 2023-09-14 DIAGNOSIS — G4731 Primary central sleep apnea: Secondary | ICD-10-CM | POA: Diagnosis not present

## 2023-09-25 DIAGNOSIS — G808 Other cerebral palsy: Secondary | ICD-10-CM | POA: Diagnosis not present

## 2023-09-25 DIAGNOSIS — Z978 Presence of other specified devices: Secondary | ICD-10-CM | POA: Diagnosis not present

## 2023-10-08 DIAGNOSIS — G4731 Primary central sleep apnea: Secondary | ICD-10-CM | POA: Diagnosis not present

## 2023-10-11 ENCOUNTER — Other Ambulatory Visit: Payer: Self-pay | Admitting: Cardiology

## 2023-10-14 DIAGNOSIS — R300 Dysuria: Secondary | ICD-10-CM | POA: Diagnosis not present

## 2023-10-18 DIAGNOSIS — R059 Cough, unspecified: Secondary | ICD-10-CM | POA: Diagnosis not present

## 2023-10-18 DIAGNOSIS — N390 Urinary tract infection, site not specified: Secondary | ICD-10-CM | POA: Diagnosis not present

## 2023-10-18 DIAGNOSIS — R058 Other specified cough: Secondary | ICD-10-CM | POA: Diagnosis not present

## 2023-10-18 DIAGNOSIS — R509 Fever, unspecified: Secondary | ICD-10-CM | POA: Diagnosis not present

## 2023-10-18 DIAGNOSIS — Z6820 Body mass index (BMI) 20.0-20.9, adult: Secondary | ICD-10-CM | POA: Diagnosis not present

## 2023-10-19 DIAGNOSIS — J410 Simple chronic bronchitis: Secondary | ICD-10-CM | POA: Diagnosis not present

## 2023-10-21 DIAGNOSIS — R300 Dysuria: Secondary | ICD-10-CM | POA: Diagnosis not present

## 2023-10-31 DIAGNOSIS — H04123 Dry eye syndrome of bilateral lacrimal glands: Secondary | ICD-10-CM | POA: Diagnosis not present

## 2023-10-31 DIAGNOSIS — H47033 Optic nerve hypoplasia, bilateral: Secondary | ICD-10-CM | POA: Diagnosis not present

## 2023-10-31 DIAGNOSIS — H47293 Other optic atrophy, bilateral: Secondary | ICD-10-CM | POA: Diagnosis not present

## 2023-10-31 DIAGNOSIS — Z09 Encounter for follow-up examination after completed treatment for conditions other than malignant neoplasm: Secondary | ICD-10-CM | POA: Diagnosis not present

## 2023-10-31 DIAGNOSIS — H5005 Alternating esotropia: Secondary | ICD-10-CM | POA: Diagnosis not present

## 2023-10-31 DIAGNOSIS — H5501 Congenital nystagmus: Secondary | ICD-10-CM | POA: Diagnosis not present

## 2023-11-03 DIAGNOSIS — E559 Vitamin D deficiency, unspecified: Secondary | ICD-10-CM | POA: Diagnosis not present

## 2023-11-03 DIAGNOSIS — G40319 Generalized idiopathic epilepsy and epileptic syndromes, intractable, without status epilepticus: Secondary | ICD-10-CM | POA: Diagnosis not present

## 2023-11-03 DIAGNOSIS — D519 Vitamin B12 deficiency anemia, unspecified: Secondary | ICD-10-CM | POA: Diagnosis not present

## 2023-11-03 DIAGNOSIS — N400 Enlarged prostate without lower urinary tract symptoms: Secondary | ICD-10-CM | POA: Diagnosis not present

## 2023-11-03 DIAGNOSIS — E441 Mild protein-calorie malnutrition: Secondary | ICD-10-CM | POA: Diagnosis not present

## 2023-11-03 DIAGNOSIS — R5383 Other fatigue: Secondary | ICD-10-CM | POA: Diagnosis not present

## 2023-11-03 DIAGNOSIS — Z8669 Personal history of other diseases of the nervous system and sense organs: Secondary | ICD-10-CM | POA: Diagnosis not present

## 2023-11-03 DIAGNOSIS — N319 Neuromuscular dysfunction of bladder, unspecified: Secondary | ICD-10-CM | POA: Diagnosis not present

## 2023-11-03 DIAGNOSIS — R300 Dysuria: Secondary | ICD-10-CM | POA: Diagnosis not present

## 2023-11-03 DIAGNOSIS — G4733 Obstructive sleep apnea (adult) (pediatric): Secondary | ICD-10-CM | POA: Diagnosis not present

## 2023-11-03 DIAGNOSIS — G809 Cerebral palsy, unspecified: Secondary | ICD-10-CM | POA: Diagnosis not present

## 2023-11-07 DIAGNOSIS — G4731 Primary central sleep apnea: Secondary | ICD-10-CM | POA: Diagnosis not present

## 2023-11-13 DIAGNOSIS — Z4549 Encounter for adjustment and management of other implanted nervous system device: Secondary | ICD-10-CM | POA: Diagnosis not present

## 2023-11-13 DIAGNOSIS — G808 Other cerebral palsy: Secondary | ICD-10-CM | POA: Diagnosis not present

## 2023-11-14 ENCOUNTER — Ambulatory Visit: Admitting: Pulmonary Disease

## 2023-11-17 ENCOUNTER — Ambulatory Visit (INDEPENDENT_AMBULATORY_CARE_PROVIDER_SITE_OTHER): Admitting: Pulmonary Disease

## 2023-11-17 ENCOUNTER — Encounter: Payer: Self-pay | Admitting: Pulmonary Disease

## 2023-11-17 VITALS — BP 136/97 | HR 82 | Ht 65.0 in | Wt 114.0 lb

## 2023-11-17 DIAGNOSIS — G919 Hydrocephalus, unspecified: Secondary | ICD-10-CM | POA: Diagnosis not present

## 2023-11-17 DIAGNOSIS — N319 Neuromuscular dysfunction of bladder, unspecified: Secondary | ICD-10-CM | POA: Diagnosis not present

## 2023-11-17 DIAGNOSIS — J42 Unspecified chronic bronchitis: Secondary | ICD-10-CM

## 2023-11-17 DIAGNOSIS — G4739 Other sleep apnea: Secondary | ICD-10-CM

## 2023-11-17 DIAGNOSIS — K219 Gastro-esophageal reflux disease without esophagitis: Secondary | ICD-10-CM | POA: Diagnosis not present

## 2023-11-17 DIAGNOSIS — J329 Chronic sinusitis, unspecified: Secondary | ICD-10-CM

## 2023-11-17 DIAGNOSIS — G800 Spastic quadriplegic cerebral palsy: Secondary | ICD-10-CM | POA: Diagnosis not present

## 2023-11-17 MED ORDER — AZELASTINE HCL 0.1 % NA SOLN: 2.0000 | Freq: Two times a day (BID) | NASAL | 12 refills | Status: AC

## 2023-11-17 MED ORDER — ALBUTEROL SULFATE (2.5 MG/3ML) 0.083% IN NEBU: 2.5000 mg | INHALATION_SOLUTION | Freq: Three times a day (TID) | RESPIRATORY_TRACT | 6 refills | Status: AC

## 2023-11-17 MED ORDER — SODIUM CHLORIDE 3 % IN NEBU: 4.0000 mL | INHALATION_SOLUTION | Freq: Three times a day (TID) | RESPIRATORY_TRACT | 6 refills | Status: AC

## 2023-11-17 NOTE — Assessment & Plan Note (Signed)
 Gastroesophageal reflux disease with history of gastritis and ulcer GERD managed with medication. Recent endoscopy showed no new findings. Symptoms persist despite current treatment.

## 2023-11-17 NOTE — Progress Notes (Signed)
 New Patient Pulmonology Office Visit   Subjective:  Patient ID: Joshua Logan, male    DOB: 12/21/1980  MRN: [id number]  Referred by: Logan, Joshua ORN, MD  CC:  Chief Complaint  Patient presents with   Establish Care    Joshua Logan - following dr Joshua Logan    HPI Joshua Logan is a 43 y.o. male with cerebral palsy with spastic quadriparesis, neurogenic bowel and bladder, seizure disorder, SVT, hypertension, shunted hydrocephalus (s/p [facility] Shunt), prematurity and spasticity (s/p Baclofen  pump) who presents to establish care.  He is well known to me for the past three years in [facility] clinic. He decided to follow me at [facility] for his care. I last saw him on 06/23/2023.  The history was mainly collected from patient's mother since patient is non-verbal.  Discussed the use of AI scribe software for clinical note transcription with the patient, who gave verbal consent to proceed.  History of Present Illness He is experiencing ongoing issues with his assistive technology, specifically his computer and wheelchair. The new wheelchair has seat issues, and the computer mount is problematic due to uncertainty about the chair's configuration. He has been without a functional computer for three years, which affects his communication. Attempts to use eye gaze technology have been challenging due to his optic nerve hypoplasia and atrophy, making hand use preferable for him.  He has a history of respiratory issues, including shortness of breath, which worsens when other health issues arise. Recently, he required increased oxygen  at night, going from two to three liters, but has since returned to two liters. An x-ray from October 1st at [facility] was performed to rule out pneumonia during a recent UTI episode. He has a history of atelectasis in the lower lobes. He uses hypertonic saline and albuterol  for respiratory management, using them three times a day. He also uses a BiPAP machine with  a new mask, the Evora, but experiences issues with fit, requiring adjustments. He has a history of both obstructive and central sleep apnea.  He has a history of recurrent UTIs, with a recent episode four weeks ago treated successfully with Cipro . He has a history of pseudomonas infections, including a past hip infection. He uses a urinal at home and diapers when out, and his caregiver ensures frequent diaper changes to prevent infections.  He is currently on GERD medication following a past episode of gastroenteritis. He has been experiencing fatigue, which may be related to his medications, including baclofen , and recent findings of low B12 and vitamin D levels.  He has received his flu shot and regularly gets COVID vaccinations, preferring [employer]. He also uses nasal sprays azelastine for drainage issues, which help clear his symptoms by the afternoon.  ROS  Allergies: Augmentin [amoxicillin-pot clavulanate], Bactrim [sulfamethoxazole-trimethoprim], Doxycycline, Versed  [midazolam ], Whole blood, Cefazolin , Midazolam  hcl, Septra [bactrim], and Vancomycin  Current Outpatient Medications:    albuterol  (PROVENTIL ) (2.5 MG/3ML) 0.083% nebulizer solution, Take 3 mLs (2.5 mg total) by nebulization in the morning, at noon, and at bedtime., Disp: 270 mL, Rfl: 6   azelastine (ASTELIN) 0.1 % nasal spray, Place 2 sprays into both nostrils 2 (two) times daily. Use in each nostril as directed, Disp: 30 mL, Rfl: 12   BACLOFEN  IT, 1,449 mcg by Intrathecal route continuous., Disp: , Rfl:    cephALEXin (KEFLEX) 250 MG capsule, Take 250 mg by mouth See admin instructions. 90 day cycle, Disp: , Rfl:    diltiazem  (CARDIZEM  SR) 90 MG 12 hr capsule,  TAKE 1 CAPSULE(90 MG) BY MOUTH TWICE DAILY, Disp: 60 capsule, Rfl: 7   ketoconazole (NIZORAL) 2 % cream, Apply 1 Application topically daily as needed for irritation., Disp: , Rfl:    ketoconazole (NIZORAL) 2 % shampoo, Apply 1 application  topically every other day., Disp:  , Rfl:    lacosamide  (VIMPAT ) 200 MG TABS tablet, Take 200 mg by mouth 2 (two) times daily., Disp: , Rfl:    lidocaine -prilocaine (EMLA) cream, Apply 1 Application topically as needed (pain)., Disp: , Rfl:    LINZESS  145 MCG CAPS, Take 145 mcg by mouth daily. , Disp: , Rfl:    nitrofurantoin (MACRODANTIN) 100 MG capsule, Take 100 mg by mouth daily. 90 day cycle, Disp: , Rfl:    omeprazole (PRILOSEC) 40 MG capsule, Take 40 mg by mouth 2 (two) times daily., Disp: , Rfl:    polyethylene glycol (MIRALAX  / GLYCOLAX ) packet, Take 8.5 g by mouth daily., Disp: , Rfl:    RESTASIS  0.05 % ophthalmic emulsion, Place 1 drop into both eyes 2 (two) times daily., Disp: , Rfl:    sucralfate  (CARAFATE ) 1 GM/10ML suspension, Take 500 mg by mouth 2 (two) times daily., Disp: , Rfl:    tamsulosin  (FLOMAX ) 0.4 MG CAPS capsule, Take 0.4 mg by mouth daily., Disp: , Rfl:    tretinoin (RETIN-A) 0.025 % cream, Apply 1 Application topically at bedtime as needed (irritation). Apply a a thin layer to affected areas nightly 20 min after washing. Start with two nights weekly and increase as tolerated., Disp: , Rfl:    Urea 40 % LOTN, Apply 1 Application topically 2 (two) times daily as needed (KERTOSIS PILARIS ON ARMS). APPLY 1-2 TIMES DAILY TO KERTOSIS PILARIS ON ARMS, Disp: , Rfl:    lactobacillus (FLORANEX/LACTINEX) PACK, Take 1 packet (1 g total) by mouth 3 (three) times daily with meals., Disp: 30 packet, Rfl: 0   ondansetron  (ZOFRAN ) 4 MG tablet, Take 1 tablet (4 mg total) by mouth daily as needed for nausea or vomiting., Disp: 15 tablet, Rfl: 1   sodium chloride  HYPERTONIC 3 % nebulizer solution, Take 4 mLs by nebulization 3 (three) times daily., Disp: 750 mL, Rfl: 6 Past Medical History:  Diagnosis Date   Cerebral palsy (HCC)    with spastic quadriparesis with numerous orthopedic procedures for contractures and fratures   Chronic constipation    Chronic pulmonary aspiration    CSA (central sleep apnea)    Dysrhythmia     PSVT   GERD (gastroesophageal reflux disease)    History of kidney stones    Hydrocephalus (HCC)    s/p VP shunt   Hypoxemia    chronic   Iron deficiency anemia    OSA (obstructive sleep apnea)    PONV (postoperative nausea and vomiting)    once time after having baclofen  pump placed   Protein calorie malnutrition    chronic   Seizures (HCC)    Tachycardia    chronic   Past Surgical History:  Procedure Laterality Date   adductor lengthening Bilateral    10/1985   BACK SURGERY     [place] RODS PLACED FOR SCOLIOSIS   BACLOFEN  PUMP PLACEMENT     BRONCHOSCOPY     05/2010- [facility]   [facility] SHUNT     CYSTOSCOPY     exploratory after UTI and ureter trauma- 09/2004   EYE SURGERY     MULTIPLE   HIP SURGERY     MULTIPLE; metal plate on right   INGUINAL HERNIA REPAIR  BILATERAL   IRRIGATION AND DEBRIDEMENT SEBACEOUS CYST N/A 09/12/2022   Procedure: EXCISIONOF BACK SEBACEOUS CYST;  Surgeon: Ebbie Cough, MD;  Location: [facility] OR;  Service: General;  Laterality: N/A;   NEPHRECTOMY Left    [facility]   PATENT DUCTUS ARTERIOUS REPAIR     11/1981   pelvic hardware removal     07/1996   rectis recessions Bilateral    11/1989   removal hip plate     [id number]/8006   RHIZOTOMY     bilateral L5 and S1 radiofrequency rhizotomy- 07/2001   right hip varus derotational osteotomy     05/1990   TOOTH EXTRACTION     2 teeth0 04/1994   VENTRICULAR ATRIAL SHUNT     VENTRICULOPERITONEAL SHUNT     12/1981   WISDOM TOOTH EXTRACTION     06/1996   Family History  Problem Relation Age of Onset   Osteoporosis Mother    Osteoarthritis Mother        in her back   Colon cancer Father    Hypertension Father    Social History   Socioeconomic History   Marital status: Single    Spouse name: Not on file   Number of children: Not on file   Years of education: Not on file   Highest education level: Not on file  Occupational History   Not on file  Tobacco Use   Smoking status: Never     Passive exposure: Never   Smokeless tobacco: Never  Vaping Use   Vaping status: Never Used  Substance and Sexual Activity   Alcohol use: No    Alcohol/week: 0.0 standard drinks of alcohol   Drug use: No   Sexual activity: Not on file  Other Topics Concern   Not on file  Social History Narrative   Lives with mother and father in [place] area   [employer] graduate   Patient is nonverbal and uses [employer] speech communication device for communication   Social Drivers of Health   Financial Resource Strain: Low Risk  (12/07/2022)   Received from [facility]   Overall Financial Resource Strain (CARDIA)    Difficulty of Paying Living Expenses: Not hard at all  Food Insecurity: No Food Insecurity (05/30/2023)   Hunger Vital Sign    Worried About Running Out of Food in the Last Year: Never true    Ran Out of Food in the Last Year: Never true  Transportation Needs: No Transportation Needs (05/30/2023)   PRAPARE - Administrator, Civil Service (Medical): No    Lack of Transportation (Non-Medical): No  Physical Activity: Not on file  Stress: Not on file  Social Connections: Not on file  Intimate Partner Violence: Not At Risk (05/30/2023)   Humiliation, Afraid, Rape, and Kick questionnaire    Fear of Current or Ex-Partner: No    Emotionally Abused: No    Physically Abused: No    Sexually Abused: No       Objective:  BP (!) 136/97   Pulse 82   Ht 5' 5 (1.651 m)   Wt 114 lb (51.7 kg)   SpO2 96% Comment: ra  BMI 18.97 kg/m  Wt Readings from Last 3 Encounters:  11/17/23 114 lb (51.7 kg)  05/30/23 122 lb 12.7 oz (55.7 kg)  05/24/23 116 lb (52.6 kg)   BMI Readings from Last 3 Encounters:  11/17/23 18.97 kg/m  05/30/23 18.13 kg/m  05/24/23 17.13 kg/m   SpO2 Readings from Last  3 Encounters:  11/17/23 96%  05/31/23 96%  05/24/23 98%    Physical Exam  Diagnostic Review:  Last CBC Lab Results  Component Value Date   WBC 6.3  05/31/2023   HGB 11.5 (L) 05/31/2023   HCT 35.1 (L) 05/31/2023   MCV 91.4 05/31/2023   MCH 29.9 05/31/2023   RDW 14.6 05/31/2023   PLT 180 05/31/2023   Last metabolic panel Lab Results  Component Value Date   GLUCOSE 96 05/31/2023   NA 142 05/31/2023   K 4.1 05/31/2023   CL 106 05/31/2023   CO2 29 05/31/2023   BUN 8 05/31/2023   CREATININE 0.69 05/31/2023   GFRNONAA >60 05/31/2023   CALCIUM 9.1 05/31/2023   PHOS 3.3 05/30/2023   PROT 8.6 (H) 05/29/2023   ALBUMIN  4.4 05/29/2023   BILITOT 0.4 05/29/2023   ALKPHOS 135 (H) 05/29/2023   AST 17 05/29/2023   ALT 11 05/29/2023   ANIONGAP 7 05/31/2023    CXR 05/2023: hypoinflated  CT ABD/PLV 05/2023: mild atelectasis in lower lobes as well as pneumatoceles.    Assessment & Plan:   Assessment & Plan Chronic bronchitis, unspecified chronic bronchitis type (HCC) Chronic bronchitis and lower lobe atelectasis Chronic bronchitis with lower lobe atelectasis likely due to shallow breathing and positioning. Oxygen  saturation stable, dyspnea during illness. No significant changes in atelectasis since last imaging. - Monitor oxygen  saturation during illness or if symptoms worsen. - Continue hypertonic saline and albuterol  treatments three times daily. - Prescribe adequate supply of albuterol  and sodium chloride  for nebulization. - Check pulse oximetry if concerned about symptoms. Chronic rhinosinusitis Continue Azelastine spray daily in each nostril Complex sleep apnea syndrome The patient is on ASV, EPAP 8 cm H2O, Min PS 4, Max PS 15. He is a 100% compliant to therapy. His residual AHI is 7.5. The patient benefits tremendously from ASV. He reports some mask fit and leak issues. I ordered mask fit testing to evaluate for new masks. For now, will continue with current therapy. - Request compliance report from [employer] for BiPAP usage. - Consider new mask fitting if current mask issues persist. Spastic quadriplegic cerebral palsy (HCC) S/p  baclofen  pump and intermittent botox injections Hydrocephalus, unspecified type (HCC) Follows with neurosurgery Gastroesophageal reflux disease without esophagitis Gastroesophageal reflux disease with history of gastritis and ulcer GERD managed with medication. Recent endoscopy showed no new findings. Symptoms persist despite current treatment. Neurogenic bladder Recurrent UTIs with neurogenic bladder. Recent UTI treated successfully with Cipro . History of pseudomonas infection noted. UTIs may be related to colonization and bladder management. - Continue to follow with urology.  Orders Placed This Encounter  Procedures   AMB REFERRAL FOR DME   I spent 60 minutes reviewing patient's chart including prior consultant notes, imaging, and PFTs as well as face-to-face with the patient, over half in discussion of the diagnosis and the importance of compliance with the treatment plan.  Return in about 6 months (around 05/16/2024).   Lyly Canizales, MD

## 2023-11-17 NOTE — Patient Instructions (Signed)
 VISIT SUMMARY: You came in for a follow-up on your respiratory issues and assistive technology needs. We discussed your ongoing problems with your wheelchair and computer, as well as your respiratory health, sleep apnea, recurrent UTIs, GERD, and fatigue.  YOUR PLAN: CHRONIC BRONCHITIS AND LOWER LOBE ATELECTASIS: You have chronic bronchitis and lower lobe atelectasis, likely due to shallow breathing and positioning. Your oxygen  levels are stable, but you experience shortness of breath during illnesses. -Monitor your oxygen  saturation during illness or if symptoms worsen. -Continue using hypertonic saline and albuterol  treatments three times a day. -We will ensure you have an adequate supply of albuterol  and sodium chloride  for nebulization. -Check your pulse oximetry if you are concerned about your symptoms.  GASTROESOPHAGEAL REFLUX DISEASE (GERD) WITH HISTORY OF GASTRITIS AND ULCER: Your GERD is being managed with medication, but you still have symptoms despite treatment. -Continue your current GERD medication.  RECURRENT URINARY TRACT INFECTIONS (UTIS) WITH NEUROGENIC BLADDER: You have recurrent UTIs due to your neurogenic bladder. Your recent UTI was successfully treated with Cipro . -Continue with your current bladder management practices to prevent infections.  CENTRAL AND OBSTRUCTIVE SLEEP APNEA: You have central and obstructive sleep apnea and are using a BiPAP machine. You are having issues with the mask fit, which may be affecting your fatigue. -Request a compliance report from [employer] for your BiPAP usage. -Consider getting a new mask fitting if the current mask issues persist.  FATIGUE ASSOCIATED WITH VITAMIN B12 AND VITAMIN D DEFICIENCY: Your fatigue may be related to low levels of vitamin B12 and vitamin D. -Continue taking your vitamin B12 and vitamin D supplements to address the deficiencies.  Contains text generated by [employer].

## 2023-11-17 NOTE — Assessment & Plan Note (Signed)
 S/p baclofen  pump and intermittent botox injections

## 2023-11-17 NOTE — Assessment & Plan Note (Signed)
Follows with neurosurgery

## 2023-11-19 DIAGNOSIS — J410 Simple chronic bronchitis: Secondary | ICD-10-CM | POA: Diagnosis not present

## 2023-11-22 DIAGNOSIS — G800 Spastic quadriplegic cerebral palsy: Secondary | ICD-10-CM | POA: Diagnosis not present

## 2023-12-08 DIAGNOSIS — Z23 Encounter for immunization: Secondary | ICD-10-CM | POA: Diagnosis not present

## 2023-12-13 DIAGNOSIS — G4731 Primary central sleep apnea: Secondary | ICD-10-CM | POA: Diagnosis not present
